# Patient Record
Sex: Female | Born: 1950 | Race: White | Hispanic: No | Marital: Married | Smoking: Former smoker
Health system: Southern US, Community
[De-identification: ages and names within clinical notes are randomized; demographics above are authoritative.]

## PROBLEM LIST (undated history)

## (undated) DIAGNOSIS — I10 Essential (primary) hypertension: Secondary | ICD-10-CM

## (undated) DIAGNOSIS — M349 Systemic sclerosis, unspecified: Secondary | ICD-10-CM

## (undated) DIAGNOSIS — R102 Pelvic and perineal pain: Secondary | ICD-10-CM

## (undated) DIAGNOSIS — K219 Gastro-esophageal reflux disease without esophagitis: Secondary | ICD-10-CM

## (undated) DIAGNOSIS — D649 Anemia, unspecified: Secondary | ICD-10-CM

## (undated) DIAGNOSIS — E669 Obesity, unspecified: Secondary | ICD-10-CM

## (undated) DIAGNOSIS — E039 Hypothyroidism, unspecified: Secondary | ICD-10-CM

## (undated) DIAGNOSIS — G4733 Obstructive sleep apnea (adult) (pediatric): Secondary | ICD-10-CM

## (undated) DIAGNOSIS — F419 Anxiety disorder, unspecified: Secondary | ICD-10-CM

## (undated) DIAGNOSIS — R0600 Dyspnea, unspecified: Secondary | ICD-10-CM

## (undated) DIAGNOSIS — M1990 Unspecified osteoarthritis, unspecified site: Secondary | ICD-10-CM

## (undated) DIAGNOSIS — N816 Rectocele: Secondary | ICD-10-CM

## (undated) DIAGNOSIS — Z9989 Dependence on other enabling machines and devices: Principal | ICD-10-CM

## (undated) DIAGNOSIS — J42 Unspecified chronic bronchitis: Secondary | ICD-10-CM

## (undated) DIAGNOSIS — E079 Disorder of thyroid, unspecified: Secondary | ICD-10-CM

## (undated) DIAGNOSIS — J449 Chronic obstructive pulmonary disease, unspecified: Secondary | ICD-10-CM

## (undated) DIAGNOSIS — R195 Other fecal abnormalities: Secondary | ICD-10-CM

## (undated) HISTORY — DX: Anxiety disorder, unspecified: F41.9

## (undated) HISTORY — DX: Essential (primary) hypertension: I10

## (undated) HISTORY — DX: Obesity, unspecified: E66.9

## (undated) HISTORY — DX: Pelvic and perineal pain: R10.2

## (undated) HISTORY — DX: Systemic sclerosis, unspecified: M34.9

## (undated) HISTORY — DX: Rectocele: N81.6

## (undated) HISTORY — DX: Dependence on other enabling machines and devices: Z99.89

## (undated) HISTORY — DX: Obstructive sleep apnea (adult) (pediatric): G47.33

## (undated) HISTORY — PX: LUNG SURGERY: SHX703

## (undated) HISTORY — DX: Other fecal abnormalities: R19.5

---

## 2001-08-23 ENCOUNTER — Other Ambulatory Visit: Admission: RE | Admit: 2001-08-23 | Discharge: 2001-08-23 | Payer: Self-pay | Admitting: Obstetrics and Gynecology

## 2001-08-24 ENCOUNTER — Encounter: Payer: Self-pay | Admitting: Obstetrics and Gynecology

## 2001-08-24 ENCOUNTER — Ambulatory Visit (HOSPITAL_COMMUNITY): Admission: RE | Admit: 2001-08-24 | Discharge: 2001-08-24 | Payer: Self-pay | Admitting: Obstetrics and Gynecology

## 2003-02-21 ENCOUNTER — Encounter: Payer: Self-pay | Admitting: Obstetrics and Gynecology

## 2003-02-21 ENCOUNTER — Ambulatory Visit (HOSPITAL_COMMUNITY): Admission: RE | Admit: 2003-02-21 | Discharge: 2003-02-21 | Payer: Self-pay | Admitting: Obstetrics and Gynecology

## 2003-02-25 ENCOUNTER — Encounter: Payer: Self-pay | Admitting: Obstetrics and Gynecology

## 2003-02-25 ENCOUNTER — Ambulatory Visit (HOSPITAL_COMMUNITY): Admission: RE | Admit: 2003-02-25 | Discharge: 2003-02-25 | Payer: Self-pay | Admitting: Obstetrics and Gynecology

## 2003-03-27 ENCOUNTER — Ambulatory Visit (HOSPITAL_COMMUNITY): Admission: RE | Admit: 2003-03-27 | Discharge: 2003-03-27 | Payer: Self-pay | Admitting: Pulmonary Disease

## 2003-12-09 ENCOUNTER — Ambulatory Visit (HOSPITAL_COMMUNITY): Admission: RE | Admit: 2003-12-09 | Discharge: 2003-12-09 | Payer: Self-pay | Admitting: Internal Medicine

## 2004-03-16 ENCOUNTER — Ambulatory Visit (HOSPITAL_COMMUNITY): Admission: RE | Admit: 2004-03-16 | Discharge: 2004-03-16 | Payer: Self-pay | Admitting: Family Medicine

## 2005-09-19 ENCOUNTER — Ambulatory Visit (HOSPITAL_COMMUNITY): Admission: RE | Admit: 2005-09-19 | Discharge: 2005-09-19 | Payer: Self-pay | Admitting: Family Medicine

## 2006-04-14 ENCOUNTER — Ambulatory Visit (HOSPITAL_COMMUNITY): Admission: RE | Admit: 2006-04-14 | Discharge: 2006-04-14 | Payer: Self-pay | Admitting: Obstetrics and Gynecology

## 2008-01-14 ENCOUNTER — Emergency Department (HOSPITAL_COMMUNITY): Admission: EM | Admit: 2008-01-14 | Discharge: 2008-01-14 | Payer: Self-pay | Admitting: Emergency Medicine

## 2008-01-16 ENCOUNTER — Inpatient Hospital Stay (HOSPITAL_COMMUNITY): Admission: AD | Admit: 2008-01-16 | Discharge: 2008-01-23 | Payer: Self-pay | Admitting: Cardiothoracic Surgery

## 2008-01-16 ENCOUNTER — Ambulatory Visit: Payer: Self-pay | Admitting: Thoracic Surgery (Cardiothoracic Vascular Surgery)

## 2008-01-16 ENCOUNTER — Encounter: Payer: Self-pay | Admitting: Emergency Medicine

## 2008-01-17 ENCOUNTER — Encounter: Payer: Self-pay | Admitting: Thoracic Surgery (Cardiothoracic Vascular Surgery)

## 2008-02-04 ENCOUNTER — Ambulatory Visit: Payer: Self-pay | Admitting: Thoracic Surgery (Cardiothoracic Vascular Surgery)

## 2008-02-14 ENCOUNTER — Ambulatory Visit: Payer: Self-pay | Admitting: Thoracic Surgery (Cardiothoracic Vascular Surgery)

## 2008-02-14 ENCOUNTER — Encounter
Admission: RE | Admit: 2008-02-14 | Discharge: 2008-02-14 | Payer: Self-pay | Admitting: Thoracic Surgery (Cardiothoracic Vascular Surgery)

## 2008-02-29 ENCOUNTER — Ambulatory Visit: Payer: Self-pay | Admitting: Thoracic Surgery (Cardiothoracic Vascular Surgery)

## 2008-02-29 ENCOUNTER — Encounter
Admission: RE | Admit: 2008-02-29 | Discharge: 2008-02-29 | Payer: Self-pay | Admitting: Thoracic Surgery (Cardiothoracic Vascular Surgery)

## 2008-03-13 ENCOUNTER — Ambulatory Visit (HOSPITAL_COMMUNITY): Admission: RE | Admit: 2008-03-13 | Discharge: 2008-03-13 | Payer: Self-pay | Admitting: Family Medicine

## 2008-10-14 ENCOUNTER — Other Ambulatory Visit: Admission: RE | Admit: 2008-10-14 | Discharge: 2008-10-14 | Payer: Self-pay | Admitting: Obstetrics & Gynecology

## 2008-11-13 IMAGING — CR DG CHEST 2V
2 series · 2 of 2 positions shown · non-contrast
Comparison: [REDACTED] chest x-ray 01/23/2008 and [HOSPITAL] CT
chest 01/16/2008

CLINICAL DATA: Follow up pneumonia, shortness of breath, cough,
post lung surgery.

CHEST - 2 VIEW

[w chest pa]
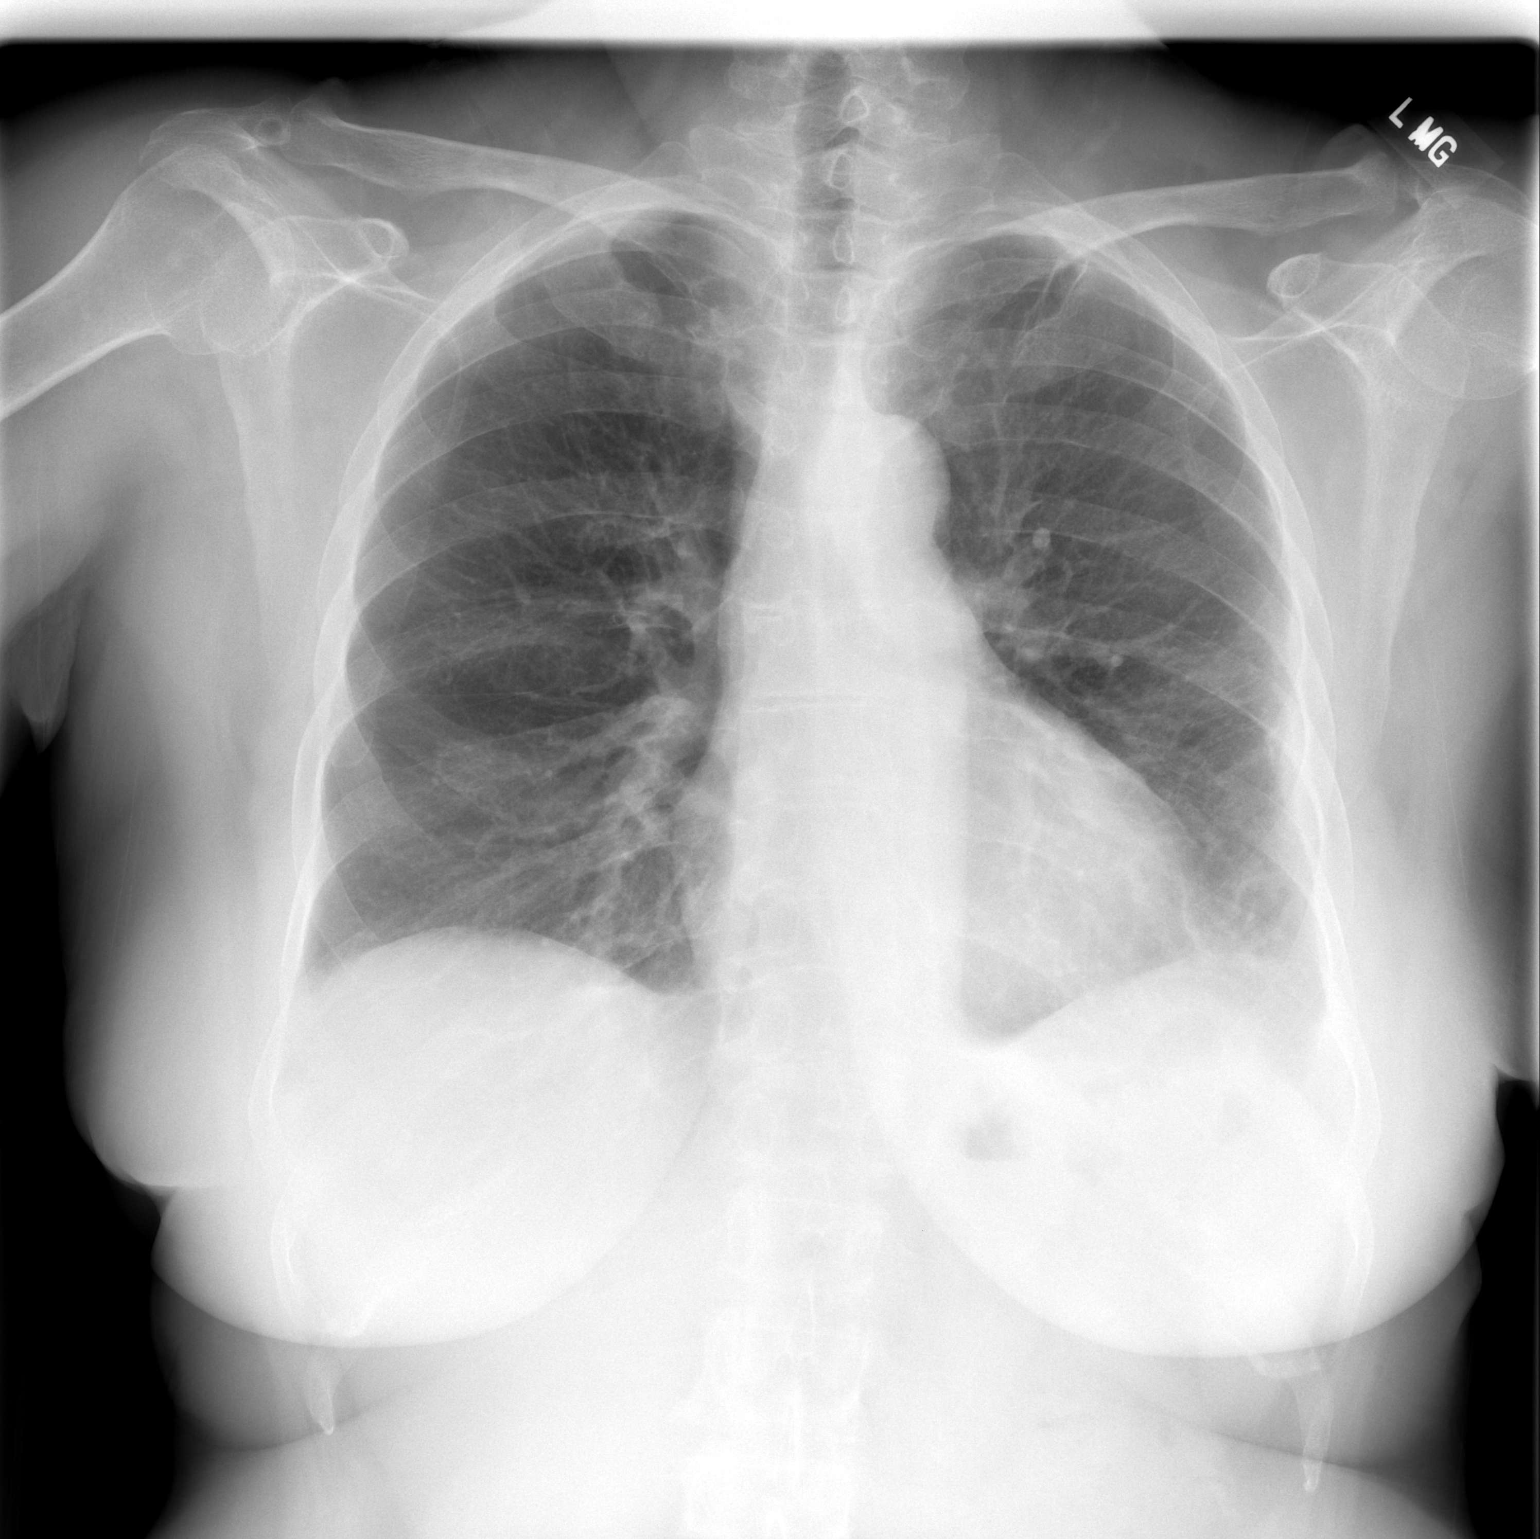

[w chest lat]
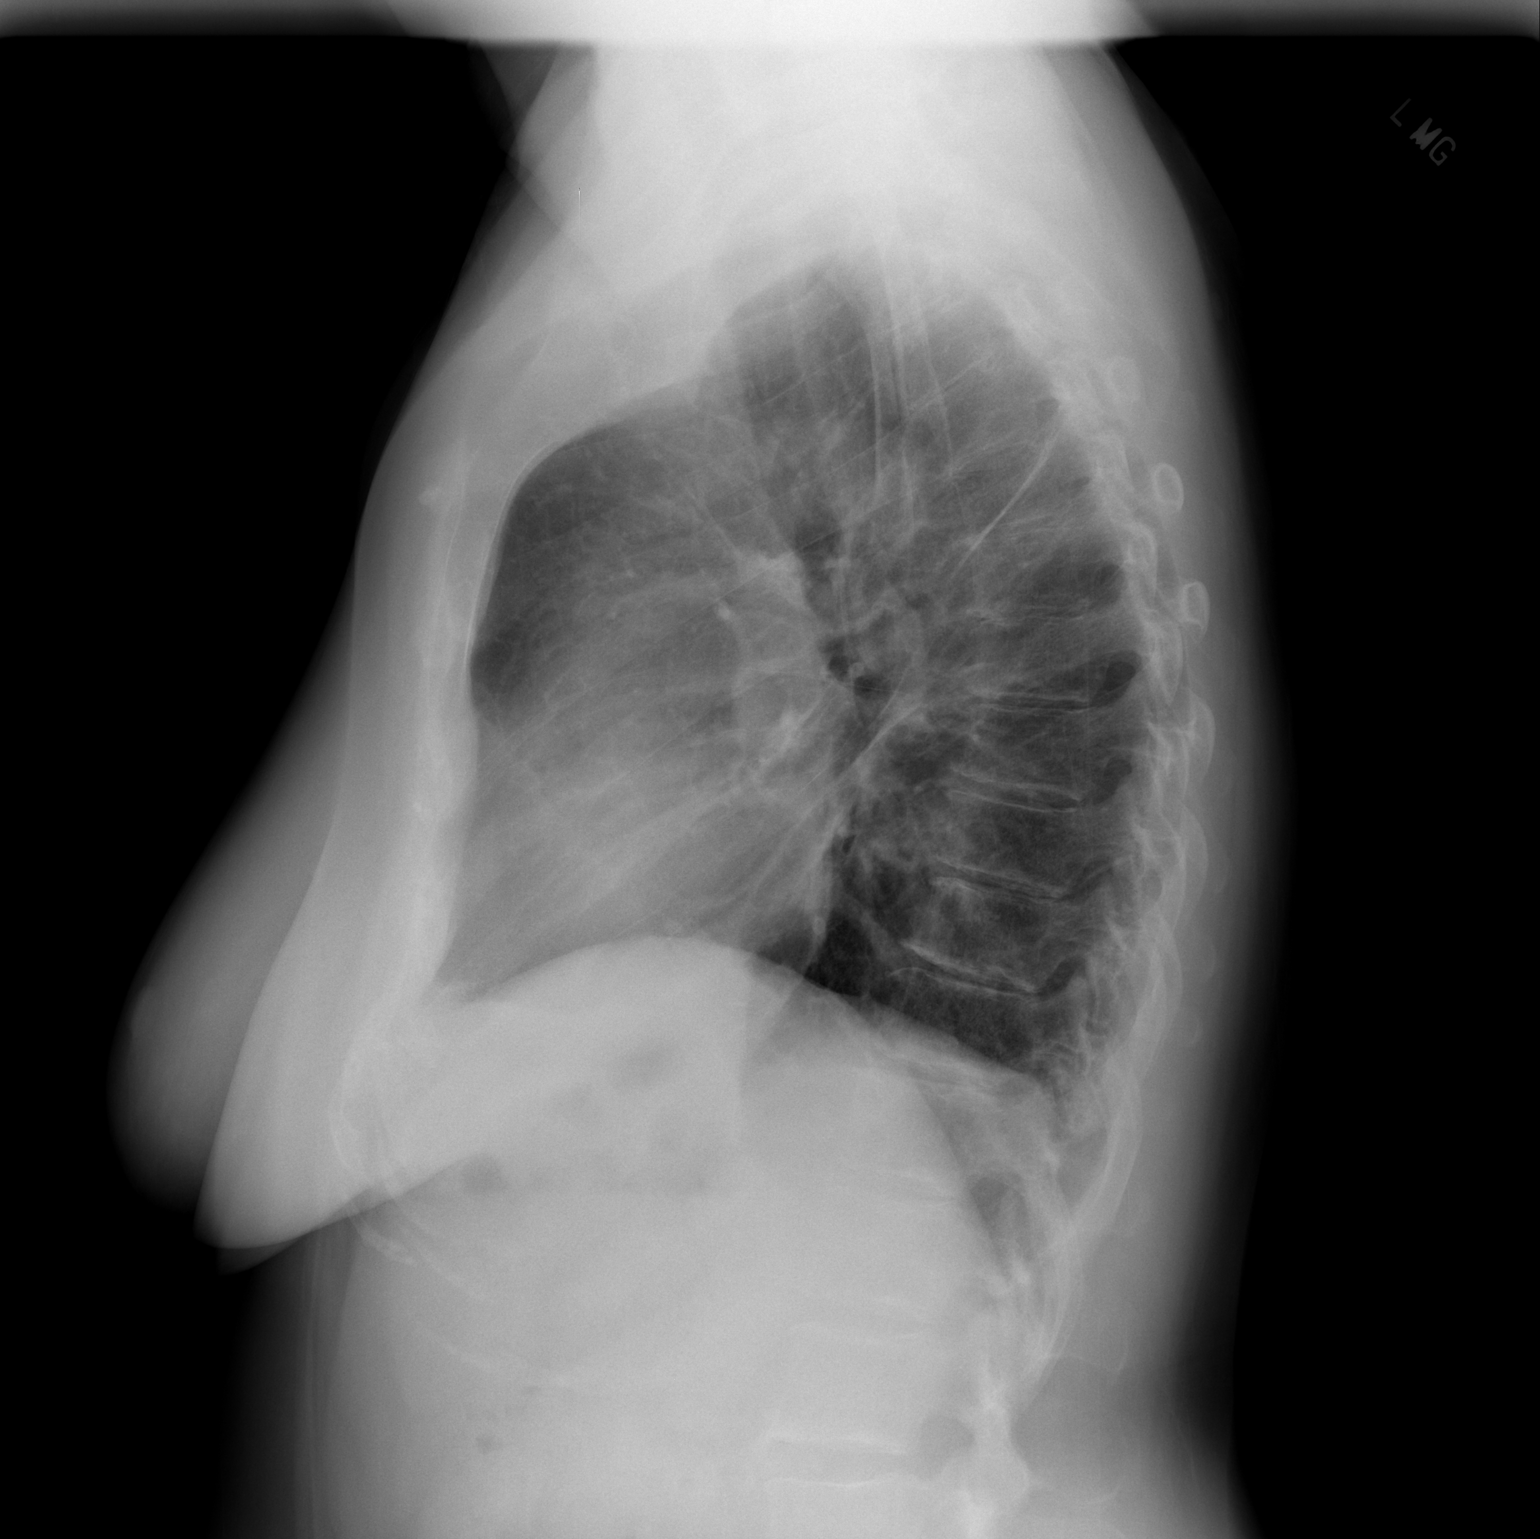

[2 of 2 positions shown; findings below may reference images not displayed]

FINDINGS: Since prior study marked interval clearing left lower
lobe is seen with slight residual linear atelectasis or scarring.
Also, marked interval clearing previous small left pleural effusion
is noted with mild residual pleural thickening or minimal loculated
fluid at the lateral left lower lobe.  Lesser linear scarring or
atelectasis is seen at the left lung apex.  Lungs are otherwise
clear.  Currently heart size is upper limits of normal.  Minimal
levo rotary scoliosis thoracolumbar spine junction is noted.  No
other new significant abnormality is seen.  Specifically no
pneumothorax noted.
IMPRESSION: 1.  Marked interval clearing left lung base with mild residual left
pleural reaction and minimal linear atelectasis and/or scarring at
the left lung base and the left lung ccccapex.
2.  Otherwise no active disease.

## 2010-01-27 ENCOUNTER — Ambulatory Visit (HOSPITAL_COMMUNITY): Admission: RE | Admit: 2010-01-27 | Discharge: 2010-01-27 | Payer: Self-pay | Admitting: Rheumatology

## 2010-04-05 ENCOUNTER — Other Ambulatory Visit: Admission: RE | Admit: 2010-04-05 | Discharge: 2010-04-05 | Payer: Self-pay | Admitting: Obstetrics and Gynecology

## 2010-07-20 ENCOUNTER — Ambulatory Visit (HOSPITAL_COMMUNITY): Admission: RE | Admit: 2010-07-20 | Discharge: 2010-07-20 | Payer: Self-pay | Admitting: Family Medicine

## 2010-09-26 ENCOUNTER — Encounter: Payer: Self-pay | Admitting: Obstetrics and Gynecology

## 2010-11-17 ENCOUNTER — Other Ambulatory Visit (HOSPITAL_COMMUNITY): Payer: Self-pay | Admitting: Family Medicine

## 2010-11-17 DIAGNOSIS — M545 Low back pain, unspecified: Secondary | ICD-10-CM

## 2010-11-17 DIAGNOSIS — M25551 Pain in right hip: Secondary | ICD-10-CM

## 2010-11-19 ENCOUNTER — Ambulatory Visit (HOSPITAL_COMMUNITY)
Admission: RE | Admit: 2010-11-19 | Discharge: 2010-11-19 | Disposition: A | Discharge status: Home / Self Care | Payer: BC Managed Care – PPO | Source: Ambulatory Visit | Attending: Family Medicine | Admitting: Family Medicine

## 2010-11-19 DIAGNOSIS — M545 Low back pain, unspecified: Secondary | ICD-10-CM | POA: Insufficient documentation

## 2010-11-19 DIAGNOSIS — M25559 Pain in unspecified hip: Secondary | ICD-10-CM | POA: Insufficient documentation

## 2010-11-19 DIAGNOSIS — M25551 Pain in right hip: Secondary | ICD-10-CM

## 2010-11-19 DIAGNOSIS — M5126 Other intervertebral disc displacement, lumbar region: Secondary | ICD-10-CM | POA: Insufficient documentation

## 2010-12-07 ENCOUNTER — Ambulatory Visit (HOSPITAL_COMMUNITY)
Admission: RE | Admit: 2010-12-07 | Discharge: 2010-12-07 | Disposition: A | Discharge status: Home / Self Care | Payer: BC Managed Care – PPO | Source: Ambulatory Visit | Attending: Sports Medicine | Admitting: Sports Medicine

## 2010-12-07 ENCOUNTER — Ambulatory Visit (HOSPITAL_COMMUNITY): Payer: Self-pay | Admitting: Physical Therapy

## 2010-12-07 DIAGNOSIS — M6281 Muscle weakness (generalized): Secondary | ICD-10-CM | POA: Insufficient documentation

## 2010-12-07 DIAGNOSIS — R262 Difficulty in walking, not elsewhere classified: Secondary | ICD-10-CM | POA: Insufficient documentation

## 2010-12-07 DIAGNOSIS — M25559 Pain in unspecified hip: Secondary | ICD-10-CM | POA: Insufficient documentation

## 2010-12-07 DIAGNOSIS — M76899 Other specified enthesopathies of unspecified lower limb, excluding foot: Secondary | ICD-10-CM | POA: Insufficient documentation

## 2010-12-07 DIAGNOSIS — M25659 Stiffness of unspecified hip, not elsewhere classified: Secondary | ICD-10-CM | POA: Insufficient documentation

## 2010-12-07 DIAGNOSIS — Z5189 Encounter for other specified aftercare: Secondary | ICD-10-CM | POA: Insufficient documentation

## 2010-12-13 ENCOUNTER — Ambulatory Visit (HOSPITAL_COMMUNITY)
Admission: RE | Admit: 2010-12-13 | Discharge: 2010-12-13 | Disposition: A | Discharge status: Home / Self Care | Payer: BC Managed Care – PPO | Source: Ambulatory Visit | Attending: *Deleted | Admitting: *Deleted

## 2010-12-15 ENCOUNTER — Ambulatory Visit (HOSPITAL_COMMUNITY): Payer: BC Managed Care – PPO | Admitting: *Deleted

## 2011-01-18 NOTE — Op Note (Signed)
[name], Berry               ACCOUNT NO.:  [id number]   MEDICAL RECORD NO.:  [id number]          PATIENT TYPE:  INP   LOCATION:  2314                         FACILITY:  [facility]   PHYSICIAN:  Salvatore Decent. Dorris Fetch, M.D.DATE OF BIRTH:  06/03/1951   DATE OF PROCEDURE:  01/17/2008  DATE OF DISCHARGE:                               OPERATIVE REPORT   PREOPERATIVE DIAGNOSIS:  Left empyema.   POSTOPERATIVE DIAGNOSES:  1. Left empyema  2. Right tension pneumothorax intraoperative.   PROCEDURE:  1. Bronchoscopy.  2. Emergent placement of right tube thoracostomy.  3. Left video-assisted thoracoscopic surgery drainage of empyema and      visceral and parietal pleural decortication.   SURGEON:  Charlett Lango, MD   ASSISTANT:  Stephanie Acre. Dominick, PA   ANESTHESIA:  General.   FINDINGS:  Bronchoscopy friable airways, no endobronchial lesions seen.  Washings taken for culture and cytology bilaterally.  No biopsies  performed.  After unsuccessful attempt at double-lumen endotracheal tube  placement by anesthesia, right tension pneumothorax based on clinical  findings resolved with chest tube placement.  Left VATS revealed a  complex exudative empyema with multiple areas of loculated murky fluid,  extensive exudate on visceral and parietal pleura, moderate pleural  peel.   CLINICAL NOTE:  Regina Berry is a 60 year old woman with a history of  tobacco abuse.  She presented with left-sided pleuritic chest pain and  was found to have a loculated pleural effusion by chest x-ray.  A CT was  performed which showed a loculated empyema.  There also was noted a  right lung nodule.  The patient was advised to undergo left VATS and  drainage of the empyema and decortication.  She was seen initially in  consultation by Dr. Cornelius Moras.  I saw the patient the morning of surgery and  reviewed the indications, risks, benefits and alternatives.  She  understood, accepted risks and agreed to proceed with  surgery.   OPERATIVE NOTE:  [name] was brought to the preop holding area on Jan 17, 2008.  There Anesthesia placed a triple-lumen central venous  catheter and an arterial blood pressure monitoring catheter.  PAS hose  were placed for DVT prophylaxis and intravenous antibiotics were  administered.  The patient was taken to the operating room, anesthetized  and intubated with a single-lumen endotracheal tube.  Flexible  fiberoptic bronchoscopy was performed.  It showed some edema in the  airways and some friability particularly on the left side.  There was  normal endobronchial anatomy and no endobronchial lesions were seen.  Anesthesia then attempted to place a dual lumen endotracheal tube for  use during the video-assisted portion of the procedure.  However,  attempts at placement were unsuccessful.  The patient then was  reintubated with a single-lumen tube.  Shortly, thereafter was noted  that the patient had markedly increased peak airway pressures that came  very difficult for Anesthesia to ventilate the patient even with hand  bagging with continued marked increase.  The patient had a drop in her  saturations.  Bronchoscopy was performed to confirm endotracheal  placement.  The tube was in fact in good position.  Diminished breath  sounds were noted bilaterally, right greater than left.  In fact, breath  sounds were completely absent on the right side.  An attempt was made to  decompress the chest with a 14-gauge intravenous catheter but was  unsuccessful.  The chest was rapidly prepped and an incision was made  and the chest was entered bluntly using a hemostat in the anterolateral  chest.  There was an immediate rush of air.  A 28-French chest tube was  placed with immediate improvement of the patient's oxygen saturations  and airway pressures.  The patient then was able to be ventilated easily  throughout the remainder of the procedure.  A chest tube was placed to  suction.   The patient then was placed in a right lateral decubitus  position, and the left chest was prepped and draped in the usual sterile  fashion.   An incision was made in the midaxillary line and the seventh intercostal  space was carried through the skin and subcutaneous tissue.  The chest  was entered bluntly using a hemostat.  A finger was inserted to take  down any localized adhesions.  There was fluid present.  This was  evacuated and sent for both cultures, as well as cytology.  A small  utility thoracotomy was performed anteriorly approximately the fifth  intercostal space.  The skin and subcutaneous tissue were incised.  Hemostasis was achieved with cautery.  A small portion of the latissimus  was divided.  The serratus was separated and the intercostal muscles  were divided.  The thoracoscope was placed through the initial incision.  There were additional pockets of fluid and these were evacuated.  This  entire portion of the procedure had to be done with intermittent  ventilation.  There was extensive exudate on both the visceral and  parietal pleural surfaces and there was a visceral pleural peel.  Care  was taken to initially ensure that all pockets of fluid had been  evacuated.  The visceral pleural exudate and peel then were removed.  The peel was particularly thick in the posterolateral aspect of the left  lower lobe, as well as in the fissure particularly inferiorly towards  the lingula.  There was some partial denuding of the lung in areas where  the peel was particularly thickened particularly in the basilar portion  of the left lower lobe overlying the diaphragm.  There was extensive  necrotic and exudative debris in the area of the diaphragm.  This was  debrided as well.  The exudative material was also debrided off the  parietal pleura.  The chest was copiously irrigated with warm saline.  The debris was sent for culture, as well as pathology.  A 36-French  right-angle  tube was placed posteriorly and superiorly, a second 36-  French right-angle tube was placed inferiorly along the diaphragm.  Both  the chest tubes were placed to suction.  The remaining port access  incision was closed with a 0 Vicryl fascial suture and 3-0 Vicryl  subcuticular suture.  The anterior mini utility thoracotomy was closed  with a running 0 Vicryl fascial suture, followed by a 2-0 Vicryl  subcutaneous suture, and a 3-0 Vicryl subcuticular suture.  Because of  difficulties with intubation, as well as need to keep the lung inflated,  the patient was taken from the operating room to the surgical intensive  care unit intubated and in stable condition.  Salvatore Decent Dorris Fetch, M.D.  Electronically Signed     SCH/MEDQ  D:  01/17/2008  T:  01/18/2008  Job:  [id number]

## 2011-01-18 NOTE — Discharge Summary (Signed)
Regina Berry, Regina Berry               ACCOUNT NO.:  [id number]   MEDICAL RECORD NO.:  [id number]          PATIENT TYPE:  INP   LOCATION:  2037                         FACILITY:  [facility]   PHYSICIAN:  Salvatore Decent. Dorris Fetch, M.D.DATE OF BIRTH:  May 12, 1951   DATE OF ADMISSION:  01/16/2008  DATE OF DISCHARGE:  01/23/2008                               DISCHARGE SUMMARY   ADMITTING DIAGNOSES:  1. Community-acquired pneumonia of the left lung with acute empyema.  2. Right lung nodule.   DISCHARGE DIAGNOSES:  1. Community-acquired pneumonia of the left lung with acute empyema.  2. Right lung nodule.  3. Tension right pneumothorax (intraop).   PROCEDURES:  1. Placement of right chest tube.  2. Bronchoscopy.  3. Left VATS with drainage of empyema and decortication by Dr.      Dorris Fetch on Jan 17, 2008.   HOSPITAL COURSE SUMMARY:  This is a 60 year old Caucasian female with a  long history of tobacco abuse who presented to [facility] with a 4-day  history of left-sided pleuritic chest pain, fever, and mild shortness of  breath.  The patient was treated for presumed community-acquired  pneumonia with a Z-Pak.  However, she returned to her primary care  physician [facility]) on Jan 16, 2008, with no  improvement in the aforementioned symptoms.  She returned to [facility] where CAT scan of the chest was done.  It revealed a left lung  consolidation and large loculated left pleural effusion with acute  empyema.  There was also a small opacity in the superior segment of the  right lower lobe of unclear etiology.  The patient was transferred from  [facility] to the [facility] where she underwent the  aforementioned procedure.  It should be noted that pathology revealed no  malignant cells in the left pleural fluid, inflammation, or active  mesothelial cells present, however.  Chest tubes revealed no air leak.  Right chest tube was placed to water seal   on Jan 18, 2008, and then  removed the following day.  Followup chest x-ray revealed no evidence of  pneumothorax.  The patient was placed on Zosyn IV antibiotic  postoperatively.  She was then changed to p.o. Augmentin.  Second chest  tube was removed on Jan 21, 2008, and followup chest x-ray revealed no  pneumothorax.  The patient did require nasal cannula postoperatively.  This was weaned as tolerated.  She was put to room air on Jan 22, 2008;  however, she desaturated to 87%.  She was placed back on 2 liters nasal  cannula with an O2 sat greater than 94%, and she continued to improve.  By postoperative day #6, which was Jan 23, 2008, she had T-max 99.3.  Vital signs were stable.  O2 sat was 98% on 2 liters nasal cannula.  Chest x-ray showed the airspace disease in consolidation of the left  base to be stable, small left pleural effusion and pulmonary nodule in  the right base, again stable. (as seen previously).   PHYSICAL EXAMINATION:  CARDIAC:  Regular rate and rhythm.  PULMONARY:  Diminished breath sounds at the left lower lobe.   Her incisions are clean, dry and continuing to heal.  She is going to  try to be weaned off O2; however, if O2 sat drops less than 90%, home  oxygen will be arranged.  She is going to discharged today; however,  prior to her discharge, smoking cessation consult will be obtained.   DISCHARGE INSTRUCTIONS:  Include the following:  1. She may shower.  2. She is not to lift or drive for 3 weeks.  3. She is to follow up with Dr. Dorris Fetch, on February 14, 2008.  At      that time, a chest x-ray will be obtained prior to this      appointment.   DISCHARGE MEDICATIONS:  Include the following:  1. Augmentin 500 mg 1 p.o. twice daily for 14 days.  2. Tylox 1-2 tablets p.o. q.4-6 h. p.r.n. pain.      Regina Fudge, PA      Viviann Spare C. Dorris Fetch, M.D.  Electronically Signed    DZ/MEDQ  D:  01/23/2008  T:  01/23/2008  Job:  [id number]

## 2011-01-18 NOTE — H&P (Signed)
Regina Berry, Regina Berry               ACCOUNT NO.:  [id number]   MEDICAL RECORD NO.:  [id number]          PATIENT TYPE:   LOCATION:                                 FACILITY:   PHYSICIAN:  Salvatore Decent. Dorris Fetch, M.D.DATE OF BIRTH:  [birth year]   DATE OF ADMISSION:  DATE OF DISCHARGE:                              HISTORY & PHYSICAL   CHIEF COMPLAINT:  Left-sided chest pain with shortness of breath.   HISTORY OF PRESENT ILLNESS:  Regina Berry is a 60 year old female with no  past medical history.  She states that she started having left-sided  chest pain on Sunday evening.  She took some Aleve and this subsided  slightly.  On Monday morning, left-sided chest pain increased and she  was having difficulty breathing.  She went to the emergency room where a  chest x-ray was done and she was diagnosed with a left pneumonia.  The  patient was placed on Zithromax as well as given oxycodone for pain.  The patient states she went home and started taking these medications.  On Wednesday, she noticed no improvement and left-sided chest pain was  stabbing, 10/10.  Increased pain with deep breathing.  She then called  her primary physician and was seen by them.  After seen by primary  physician, the patient was instructed to go to [facility] Emergency  Room.  In the [facility] Emergency Room, repeat chest x-ray was done,  which showed worsening left pneumonia.  The patient then underwent a CT  scan, which showed a highly loculated left pleural effusion.  There is  an airspace nodule in the right lower lobe, which questionably may be  infectious.  Following CT scan, Dr. Tyrone Sage was contacted.  After  evaluation of the CT scan, they decided to transfer the patient over to  [facility] for possible drainage of left empyema.   The patient states that her left-sided chest pain is improved with the  pain medications.  Still has some discomfort with deep breathing.  She  states she did have a fever  of 102 on Sunday, but today it was still 99.  She denies coughing, hemoptysis, or wheezing.   PAST MEDICAL HISTORY:  None.   SURGICAL HISTORY:  1. Cesarean section.  2. Surgery, left hand.   ALLERGIES:  The patient states she is allergic to CODEINE, causes  itching.   SOCIAL HISTORY:  Positive tobacco abuse, mostly about packet a day for  the past 40 years.  Denies alcohol abuse.  She is currently still  working in a tobacco factory.   FAMILY HISTORY:  Noncontributory.   MEDICATIONS:  No medications taken at home on a regular basis.   REVIEW OF SYSTEMS:  See HPI for pertinent positives and negatives.  The  patient denies any recent changes in vision, hearing, or difficulty  swallowing.  Denies headache.  Denies palpitations, lightheadedness,  dizziness, orthopnea, or proximal nocturnal dyspnea.  Denies abdominal  pain, changes in bowel movements, diarrhea, constipation, melena,  hematemesis, or hematochezia.  Denies urgency, frequency, dysuria, or  hematuria.  Denies muscle aches or pains.  Denies claudication symptoms  such as pain in calves with ambulation or decreased temperature in feet.  Denies TIA or CVA symptoms such as amaurosis fugax, slurred speech, or  muscle weakness.   PHYSICAL EXAMINATION:  VITAL SIGNS:  Not available.  GENERAL:  A well-developed, well-nourished white female, in no acute  distress.  HEENT:  Normocephalic and atraumatic.  The pupils are equal, round, and  reactive to light and accommodation.  Extraocular movements are intact.  NECK:  Neck is supple.  RESPIRATORY:  Diminished breath sounds, left base.  CARDIAC:  Regular rate and rhythm with S1 and S2 noted.  No murmurs,  gallops, or rubs noted.  ABDOMEN:  Bowel sounds x4.  Soft and nontender on palpitation.  DENTURE:  Now deferred.  RECTAL:  Deferred.  EXTREMITIES:  The patient's upper and lower extremities are warm to  touch.  She has 2+ bilateral radial, DP, and PT pulses noted.  No edema,   cyanosis, or clubbing noted.   STUDIES:  The patient had CT scan done at [facility] today on Jan 16, 2008, showing a highly loculated left pleural effusion and an airspace  nodule in the right lower lobe.   IMPRESSION AND PLAN:  We will admit the patient to [facility]  under Dr. Dorris Fetch with diagnosis of a left empyema and a right lower  lobe nodule.  We will obtain routine lab work and PA and lateral chest x-  ray as well as start the patient on IV vancomycin.  The patient will be  seen and evaluated by Dr. Cornelius Moras on admission for discussion of surgery by  Dr. Dorris Fetch for Jan 17, 2008.  Plan is to proceed with a left video-  assisted thoracoscopic surgery with drainage the left empyema on Jan 17, 2008, by Dr. Dorris Fetch.  Risks and benefits were discussed with the  patient.  She acknowledged understanding and agreed to proceed.      Regina Belfast, PA      Salvatore Decent. Dorris Fetch, M.D.  Electronically Signed    KMD/MEDQ  D:  01/16/2008  T:  01/17/2008  Job:  [id number]   cc:   Salvatore Decent. Dorris Fetch, M.D.

## 2011-01-18 NOTE — Assessment & Plan Note (Signed)
OFFICE VISIT   Regina, FORDHAM Berry  DOB:  August 13, 1951                                        February 14, 2008  CHART #:  [id number]   REASON FOR VISIT:  Follow up recent decortication.   HPI:  This patient is a 60 year old woman who had left empyema.  She had  a VATS for drainage and decortication on Jan 17, 2008, when she was  intubated, but before we started the left-sided VATS, she had a right  tension pneumothorax and had a chest tube placed for that.  She now  returns for postoperative followup.  She says that she still  occasionally feels some discomfort, particularly when she takes a deep  breath, but is not having to take any pain medication.  She has some  irritation from her chest tube sutures, otherwise feels well.  She did  not have any fevers or chills.   PHYSICAL EXAMINATION:  This patient is a 60 year old woman in no acute  distress.  Blood pressure 145/86, pulse 68, respirations are 22, and oxygen  saturation is 96% on room air.  LUNGS:  Equal breath sounds bilaterally.  Her incisions are healing well.  She does have some local irritation  from the chest tube sutures, which were removed, but the sites are  healed.   Chest x-ray shows good aeration of the left lung, much improved.  There  is still some opacity in the right lower lobe, but this is less  prominent than on  the film on Jan 16, 2008.   IMPRESSION:  This patient is a 60 year old woman, she is status post a  decortication for a left empyema.  She has had an excellent surgical  result and is recovering nicely from that.  There is still a question of  the right lung nodule that was seen on the CT and previous chest x-rays.  This may be infection related and does appear to be less prominent on  the chest x-ray today although, I cannot rule out slight variation in  technique.  I have recommended to her that we repeat a CT  scan to reevaluate that right lung nodule and make sure that  is  resolving.  We will plan to do that in about 2 weeks, that will be 6  weeks from the time of her presentation.   Salvatore Decent Dorris Fetch, M.D.  Electronically Signed   SCH/MEDQ  D:  02/14/2008  T:  02/14/2008  Job:  [id number]   cc:   [facility]

## 2011-01-18 NOTE — Assessment & Plan Note (Signed)
OFFICE VISIT   MANUEL, LAWHEAD L  DOB:  June 04, 1951                                        February 29, 2008  CHART #:  [id number]   HISTORY OF PRESENT ILLNESS:  The patient is a 60 year old woman who had  a left empyema.  She had a left VATS and decortication on Jan 17, 2008.  She was seen in the office on February 14, 2008, and there was a question at  that time of a right lung nodule.  She now returns with a CT for  followup.  She states in the interim, she has  been doing well.  No  fevers, chills, or sweats.  No problems with her breathing.  She still  has some numbness in her left subcostal region.   PHYSICAL EXAMINATION:  GENERAL:  The patient is a well-appearing 56-year-  old white female in no acute distress.  She is well-developed and well-  nourished.  VITAL SIGNS:  Blood pressure 173/84, pulse 64, respirations  were 18, and her oxygen saturation is 97% on room air.  LUNGS:  Clear with equal breath sounds.   LABORATORY DATA:  CT scan is reviewed.  There has been an interval  decrease in the size of the abnormality in the superior segment of her  right lower lobe.  It has gone from 13 mm down to 5 mm consistent with  resolving infection.  She had a 4-mm nodule in her left lung on the  previous study, which is no longer evident.  She also has a small left  adrenal nodule.  This had been previously 11 x 17, it is now 9 x 13, so  it is decreased in size as well.  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  There is  no mass evident.   IMPRESSION:  The patient is doing well at this point in time.  She is  now recovering from a decortication.  Her sense of numbness in her  subcostal region is common after thoracic surgery, usually will resolve  with time but sometimes can remain that way.  Her radiologic result is  excellent with the best I have seen with a decortication.  She had  multiple other findings  of small nodule in her left lung, which is now  resolved.  Question of a nodule in the right lung, which is markedly  decreased in size and consistent with resolving infection as well as a  benign-appearing adrenal lesion, which is decreased in size since her  last study.  I did discuss with her this finding of esophageal  dilatation and question of dysmotility.  Certainly, it could be a  contributing factor to why she developed a pneumonia.  On further  questioning with her in that regard, she has had some questionable  reflux-like symptoms.  I advised her that she should have a GI  evaluation.  She has requested Dr. Jena Gauss in [place], who her husband  has seen previously.  We will see if we can help her to arrange that  appointment to see if there is any further evaluation, and is to be done  in that regard.   Salvatore Decent Dorris Fetch, M.D.  Electronically Signed   SCH/MEDQ  D:  02/29/2008  T:  03/01/2008  Job:  [id number]   cc:   [facility]

## 2011-01-21 NOTE — Op Note (Signed)
NAME:  Regina Berry, Regina Berry                         ACCOUNT NO.:  [id number]   MEDICAL RECORD NO.:  [id number]                   PATIENT TYPE:  AMB   LOCATION:  DAY                                  FACILITY:  [facility]   PHYSICIAN:  Lionel December, M.D.                 DATE OF BIRTH:  1951/01/17   DATE OF PROCEDURE:  12/09/2003  DATE OF DISCHARGE:                                 OPERATIVE REPORT   PROCEDURE:  Total colonoscopy.   ENDOSCOPIST:  Lionel December, M.D.   INDICATIONS:  Regina Berry is a 60 year old Caucasian female with intermittent  hematochezia who is undergoing diagnostic colonoscopy.  At times she is  constipated, at other times she has postprandial loose bowel movements.  She  is suspected ot have IBS.  The procedure and risks were reviewed with the  patient and informed consent was obtained.  Family history is negative for  colon carcinoma in first-degree relatives, but positive for CRC in maternal  aunt.   PREOPERATIVE MEDICATIONS:  Demerol 50 mg IV and Versed 8 mg IV in divided  dose.   FINDINGS:  Procedure performed in endoscopy suite.  The patient's vital  signs and O2 saturation were monitored during the procedure and remained  stable.  The patient was placed in the left lateral recumbent position and  rectal examination was performed.  No abnormality noted on external or  digital exam.   Olympus videoscope was placed in the rectum and advanced under vision into  the sigmoid colon and beyond.  Preparation was excellent.  Scope was passed  to the cecum which was identified by appendiceal orifice and the ileocecal  valve.  Pictures were taken for the record.  As the scope was withdrawn the  colonic mucosa was carefully examined.  There were no polyps, tumor masses,  or other abnormalities.  The rectal mucosa similarly was normal.   The scope was retroflexed to examine the anorectal junction and hemorrhoids  were noted below the dentate line along with a prominent anal  papilla.  Pictures taken for the record.  The endoscope was straightened and  withdrawn.  The patient tolerated the procedure well.   FINAL DIAGNOSIS:  Small external hemorrhoids with a prominent anal papilla,  otherwise normal colonoscopy.   RECOMMENDATIONS:  1. High fiber diet.  2. Citrucel or equivalent 1 tablespoonful daily.  3. She will call if bleeding episodes are frequent.      ___________________________________________                                            Lionel December, M.D.   NR/MEDQ  D:  12/09/2003  T:  12/09/2003  Job:  [id number]   cc:   Tilda Burrow, M.D.  [address] [place]  [place] [address]  Fax: [phone]   Kirk Ruths, M.D.  [address]  [place]  [place] [address]  Fax: [phone]

## 2011-06-01 LAB — BASIC METABOLIC PANEL
BUN: 7
CO2: 28
CO2: 29
Calcium: 8.5
Calcium: 8.7
Calcium: 8.9
Creatinine, Ser: 0.58
GFR calc Af Amer: >60
GFR calc Af Amer: >60
GFR calc non Af Amer: >60
GFR calc non Af Amer: >60
GFR calc non Af Amer: >60
Glucose, Bld: 130 — ABNORMAL HIGH
Glucose, Bld: 174 — ABNORMAL HIGH
Potassium: 3.5
Sodium: 140
Sodium: 141

## 2011-06-01 LAB — POCT I-STAT 3, ART BLOOD GAS (G3+)
Acid-Base Excess: 2
Acid-Base Excess: 2
Bicarbonate: 26.8 — ABNORMAL HIGH
Bicarbonate: 26.8 — ABNORMAL HIGH
O2 Saturation: 95
O2 Saturation: 98
Operator id: 137421
Operator id: 300061
Patient temperature: 97.9
Patient temperature: 98.4
TCO2: 28
TCO2: 28
pCO2 arterial: 40.3
pCO2 arterial: 41.2
pH, Arterial: 7.42 — ABNORMAL HIGH
pH, Arterial: 7.43 — ABNORMAL HIGH
pO2, Arterial: 106 — ABNORMAL HIGH
pO2, Arterial: 72 — ABNORMAL LOW

## 2011-06-01 LAB — CBC
HCT: 32.7 — ABNORMAL LOW
Hemoglobin: 10.9 — ABNORMAL LOW
Hemoglobin: 11.4 — ABNORMAL LOW
MCHC: 33.8
MCHC: 34.8
Platelets: 208
RBC: 3.58 — ABNORMAL LOW
RDW: 13.4
RDW: 13.8
WBC: 11.7 — ABNORMAL HIGH

## 2011-06-27 ENCOUNTER — Other Ambulatory Visit: Payer: Self-pay | Admitting: Obstetrics and Gynecology

## 2011-06-27 DIAGNOSIS — Z139 Encounter for screening, unspecified: Secondary | ICD-10-CM

## 2011-07-25 ENCOUNTER — Ambulatory Visit (HOSPITAL_COMMUNITY)
Admission: RE | Admit: 2011-07-25 | Discharge: 2011-07-25 | Disposition: A | Discharge status: Home / Self Care | Payer: BC Managed Care – PPO | Source: Ambulatory Visit | Attending: Obstetrics and Gynecology | Admitting: Obstetrics and Gynecology

## 2011-07-25 DIAGNOSIS — Z139 Encounter for screening, unspecified: Secondary | ICD-10-CM

## 2011-07-25 DIAGNOSIS — Z1231 Encounter for screening mammogram for malignant neoplasm of breast: Secondary | ICD-10-CM | POA: Insufficient documentation

## 2011-08-10 ENCOUNTER — Other Ambulatory Visit (HOSPITAL_COMMUNITY)
Admission: RE | Admit: 2011-08-10 | Discharge: 2011-08-10 | Disposition: A | Discharge status: Home / Self Care | Payer: BC Managed Care – PPO | Source: Ambulatory Visit | Attending: Obstetrics and Gynecology | Admitting: Obstetrics and Gynecology

## 2011-08-10 ENCOUNTER — Other Ambulatory Visit: Payer: Self-pay | Admitting: Adult Health

## 2011-08-10 DIAGNOSIS — Z01419 Encounter for gynecological examination (general) (routine) without abnormal findings: Secondary | ICD-10-CM | POA: Insufficient documentation

## 2012-04-01 ENCOUNTER — Emergency Department (HOSPITAL_COMMUNITY)
Admission: EM | Admit: 2012-04-01 | Discharge: 2012-04-01 | Disposition: A | Discharge status: Home / Self Care | Payer: BC Managed Care – PPO | Attending: Emergency Medicine | Admitting: Emergency Medicine

## 2012-04-01 ENCOUNTER — Encounter (HOSPITAL_COMMUNITY): Payer: Self-pay

## 2012-04-01 DIAGNOSIS — R609 Edema, unspecified: Secondary | ICD-10-CM

## 2012-04-01 DIAGNOSIS — R600 Localized edema: Secondary | ICD-10-CM

## 2012-04-01 DIAGNOSIS — E079 Disorder of thyroid, unspecified: Secondary | ICD-10-CM | POA: Insufficient documentation

## 2012-04-01 HISTORY — DX: Disorder of thyroid, unspecified: E07.9

## 2012-04-01 LAB — BASIC METABOLIC PANEL
BUN: 17 mg/dL (ref 6–23)
CO2: 29 mEq/L (ref 19–32)
Chloride: 102 mEq/L (ref 96–112)
GFR calc Af Amer: 70 mL/min — ABNORMAL LOW (ref >90)
Glucose, Bld: 90 mg/dL (ref 70–99)
Potassium: 3.8 mEq/L (ref 3.5–5.1)

## 2012-04-01 MED ORDER — ENOXAPARIN SODIUM 100 MG/ML ~~LOC~~ SOLN
100.0000 mg | Freq: Once | SUBCUTANEOUS | Status: AC
  Administered 2012-04-01: 100 mg via SUBCUTANEOUS
  Filled 2012-04-01: qty 1

## 2012-04-01 NOTE — ED Notes (Signed)
Left foot swollen, started about 4 days ago per pt.

## 2012-04-01 NOTE — ED Notes (Signed)
Pt alert & oriented x4, stable gait. Patient given discharge instructions, paperwork. Patient instructed to stop at the registration desk to finish any additional paperwork. Patient verbalized understanding. Pt left department w/ no further questions.  

## 2012-04-01 NOTE — ED Notes (Signed)
Swelling noted to the left foot for the past 4 days, denies any injury. Some edema noted, pulses present, good cap refill.

## 2012-04-02 ENCOUNTER — Other Ambulatory Visit (HOSPITAL_COMMUNITY): Payer: BC Managed Care – PPO

## 2012-04-02 ENCOUNTER — Ambulatory Visit (HOSPITAL_COMMUNITY)
Admit: 2012-04-02 | Discharge: 2012-04-02 | Disposition: A | Discharge status: Home / Self Care | Payer: BC Managed Care – PPO | Source: Ambulatory Visit | Attending: Emergency Medicine | Admitting: Emergency Medicine

## 2012-04-02 DIAGNOSIS — R609 Edema, unspecified: Secondary | ICD-10-CM

## 2012-04-02 DIAGNOSIS — M7989 Other specified soft tissue disorders: Secondary | ICD-10-CM | POA: Insufficient documentation

## 2012-04-02 NOTE — ED Provider Notes (Signed)
Medical screening examination/treatment/procedure(s) were performed by non-physician practitioner and as supervising physician I was immediately available for consultation/collaboration.  Geoffery Lyons, MD 04/02/12 1037

## 2012-04-02 NOTE — ED Provider Notes (Signed)
History     CSN: [id number]  Arrival date & time 04/01/12  2030   First MD Initiated Contact with Patient 04/01/12 2040      Chief Complaint  Patient presents with  . Foot Swelling    (Consider location/radiation/quality/duration/timing/severity/associated sxs/prior treatment) HPI Comments: Regina Berry presents with a 4 day history of increased swelling in her left foot and lower leg.  She denies pain, but states the extremity feels very tight and uncomfortable.  She does have a history of intermittent peripheral edema for which she takes lasix,  Reporting usually takes 1-2 tablets per week on average.  This medicine has not been improving the edema in her left leg. She denies any increased activity or or prolonged standing and also denies any significant long periods of inactivity,  But did ride to [place], [place] and back last week.  She has had no fevers,  Chills,  Shortness of breath,  Chest pain or rash.  She also denies trauma to the extremity.  Her last dose of lasix was yesterday.  She has kept her foot elevated without relief of swelling.  The history is provided by the patient and a relative.    Past Medical History  Diagnosis Date  . Thyroid disease     Past Surgical History  Procedure Date  . Cesarean section   . Lung surgery     History reviewed. No pertinent family history.  History  Substance Use Topics  . Smoking status: Never Smoker   . Smokeless tobacco: Not on file  . Alcohol Use: Yes     occasionally    OB History    Grav Para Term Preterm Abortions TAB SAB Ect Mult Living                  Review of Systems  Constitutional: Negative for fever.  HENT: Negative.   Eyes: Negative.   Respiratory: Negative for chest tightness and shortness of breath.   Cardiovascular: Positive for leg swelling. Negative for chest pain.  Gastrointestinal: Negative for nausea and abdominal pain.  Genitourinary: Negative.  Negative for decreased urine volume.    Musculoskeletal: Negative for joint swelling and arthralgias.  Skin: Negative.  Negative for color change, rash and wound.  Neurological: Negative for dizziness, weakness, light-headedness, numbness and headaches.  Hematological: Negative.   Psychiatric/Behavioral: Negative.     Allergies  Codeine  Home Medications   Current Outpatient Rx  Name Route Sig Dispense Refill  . ALPRAZOLAM 0.5 MG PO TABS Oral Take 0.5 mg by mouth daily as needed. anxiety    . FUROSEMIDE 20 MG PO TABS Oral Take 20 mg by mouth daily as needed. fluid    . HYDROCODONE-ACETAMINOPHEN 5-500 MG PO TABS Oral Take 1 tablet by mouth 2 (two) times daily as needed. pain    . LEVOTHYROXINE SODIUM 75 MCG PO TABS Oral Take 75 mcg by mouth daily.    Marland Kitchen ZOLPIDEM TARTRATE 10 MG PO TABS Oral Take 5 mg by mouth at bedtime as needed. sleep      BP 137/69  Pulse 67  Temp 98 F (36.7 C) (Oral)  Resp 16  Ht 5' 4.5" (1.638 m)  Wt 220 lb (99.791 kg)  BMI 37.18 kg/m2  SpO2 99%  Physical Exam  Nursing note and vitals reviewed. Constitutional: She appears well-developed and well-nourished.  HENT:  Head: Normocephalic and atraumatic.  Neck: No JVD present.  Cardiovascular: Normal rate, regular rhythm, normal heart sounds and intact distal pulses.  Pulses:      Dorsalis pedis pulses are 2+ on the right side, and 2+ on the left side.  Pulmonary/Chest: Effort normal and breath sounds normal. No respiratory distress. She has no wheezes. She has no rales.  Abdominal: She exhibits no distension. There is no tenderness.  Musculoskeletal: Normal range of motion. She exhibits edema and tenderness.       Trace edema right ankle.  1+ edema left dorsal foot and ankle to mid tibia.  No erythema or red streaking,  No palpable cords or nodules.  Positive Homans.    Neurological: She is alert.  Skin: Skin is warm and dry.  Psychiatric: She has a normal mood and affect.    ED Course  Procedures (including critical care time)  Labs  Reviewed  BASIC METABOLIC PANEL - Abnormal; Notable for the following:    GFR calc non Af Amer 61 (*)     GFR calc Af Amer 70 (*)     All other components within normal limits  D-DIMER, QUANTITATIVE - Abnormal; Notable for the following:    D-Dimer, Quant 0.62 (*)     All other components within normal limits   No results found.   1. Lower extremity edema       MDM  Pt with unilaterally increased peripheral edema and abnormal d dimer.  Pts sx may simply be due to benign increase in peripheral edema, but unable to rule out dvt with elevated d dimer.  Pt tx with lovenox injection.  Scheduled to return in am for doppler US.  Pt agreeable with plan.        Burgess Amor, [place] 04/02/12 [phone]

## 2012-04-02 NOTE — ED Provider Notes (Signed)
Patient returned today for venous imaging of left LE.  Ultrasound was negative for DVT.  Discussed result with the patient.  She agrees to close follow-up with her PMD, Dr. Phillips Odor.  Alizae Bechtel L. [place], [place] 04/02/12 1129

## 2012-04-05 NOTE — ED Provider Notes (Signed)
Medical screening examination/treatment/procedure(s) were performed by non-physician practitioner and as supervising physician I was immediately available for consultation/collaboration.   Geoffery Lyons, MD 04/05/12 [phone]

## 2012-07-06 ENCOUNTER — Other Ambulatory Visit (HOSPITAL_COMMUNITY): Payer: Self-pay | Admitting: Family Medicine

## 2012-07-06 DIAGNOSIS — Z139 Encounter for screening, unspecified: Secondary | ICD-10-CM

## 2012-07-30 ENCOUNTER — Ambulatory Visit (HOSPITAL_COMMUNITY)
Admission: RE | Admit: 2012-07-30 | Discharge: 2012-07-30 | Disposition: A | Discharge status: Home / Self Care | Payer: BC Managed Care – PPO | Source: Ambulatory Visit | Attending: Family Medicine | Admitting: Family Medicine

## 2012-07-30 DIAGNOSIS — Z139 Encounter for screening, unspecified: Secondary | ICD-10-CM

## 2012-07-30 DIAGNOSIS — Z1231 Encounter for screening mammogram for malignant neoplasm of breast: Secondary | ICD-10-CM | POA: Insufficient documentation

## 2012-08-13 ENCOUNTER — Other Ambulatory Visit: Payer: Self-pay | Admitting: Adult Health

## 2012-08-13 ENCOUNTER — Other Ambulatory Visit (HOSPITAL_COMMUNITY)
Admission: RE | Admit: 2012-08-13 | Discharge: 2012-08-13 | Disposition: A | Discharge status: Home / Self Care | Payer: BC Managed Care – PPO | Source: Ambulatory Visit | Attending: Obstetrics and Gynecology | Admitting: Obstetrics and Gynecology

## 2012-08-13 DIAGNOSIS — Z1151 Encounter for screening for human papillomavirus (HPV): Secondary | ICD-10-CM | POA: Insufficient documentation

## 2012-08-13 DIAGNOSIS — Z01419 Encounter for gynecological examination (general) (routine) without abnormal findings: Secondary | ICD-10-CM | POA: Insufficient documentation

## 2013-08-13 ENCOUNTER — Other Ambulatory Visit (HOSPITAL_COMMUNITY): Payer: Self-pay | Admitting: Family Medicine

## 2013-08-13 DIAGNOSIS — Z139 Encounter for screening, unspecified: Secondary | ICD-10-CM

## 2013-08-19 ENCOUNTER — Ambulatory Visit (HOSPITAL_COMMUNITY)
Admission: RE | Admit: 2013-08-19 | Discharge: 2013-08-19 | Disposition: A | Discharge status: Home / Self Care | Payer: BC Managed Care – PPO | Source: Ambulatory Visit | Attending: Family Medicine | Admitting: Family Medicine

## 2013-08-19 DIAGNOSIS — Z1231 Encounter for screening mammogram for malignant neoplasm of breast: Secondary | ICD-10-CM | POA: Insufficient documentation

## 2013-08-19 DIAGNOSIS — Z139 Encounter for screening, unspecified: Secondary | ICD-10-CM

## 2014-01-23 ENCOUNTER — Other Ambulatory Visit (HOSPITAL_COMMUNITY)
Admission: RE | Admit: 2014-01-23 | Discharge: 2014-01-23 | Disposition: A | Discharge status: Home / Self Care | Payer: BC Managed Care – PPO | Source: Ambulatory Visit | Attending: Adult Health | Admitting: Adult Health

## 2014-01-23 ENCOUNTER — Ambulatory Visit (INDEPENDENT_AMBULATORY_CARE_PROVIDER_SITE_OTHER): Payer: BC Managed Care – PPO | Admitting: Adult Health

## 2014-01-23 ENCOUNTER — Encounter: Payer: Self-pay | Admitting: Adult Health

## 2014-01-23 VITALS — BP 120/68 | HR 74 | Ht 63.0 in | Wt 201.0 lb

## 2014-01-23 DIAGNOSIS — R102 Pelvic and perineal pain: Secondary | ICD-10-CM

## 2014-01-23 DIAGNOSIS — Z01419 Encounter for gynecological examination (general) (routine) without abnormal findings: Secondary | ICD-10-CM

## 2014-01-23 DIAGNOSIS — N816 Rectocele: Secondary | ICD-10-CM

## 2014-01-23 DIAGNOSIS — Z1151 Encounter for screening for human papillomavirus (HPV): Secondary | ICD-10-CM | POA: Insufficient documentation

## 2014-01-23 DIAGNOSIS — Z1212 Encounter for screening for malignant neoplasm of rectum: Secondary | ICD-10-CM

## 2014-01-23 DIAGNOSIS — Z1389 Encounter for screening for other disorder: Secondary | ICD-10-CM

## 2014-01-23 HISTORY — DX: Pelvic and perineal pain: R10.2

## 2014-01-23 HISTORY — DX: Rectocele: N81.6

## 2014-01-23 LAB — POCT URINALYSIS DIPSTICK
Blood, UA: NEGATIVE
GLUCOSE UA: NEGATIVE
LEUKOCYTES UA: NEGATIVE
Nitrite, UA: NEGATIVE

## 2014-01-23 LAB — HEMOCCULT GUIAC POC 1CARD (OFFICE): FECAL OCCULT BLD: NEGATIVE

## 2014-01-23 NOTE — Patient Instructions (Signed)
Physical in 1 year Mammogram yearly return in 2 weeks for [place] Labs at PCP Colonoscopy a per GI

## 2014-01-23 NOTE — Progress Notes (Signed)
Patient ID: Regina Berry, female   DOB: 1950-09-10, 63 y.o.   MRN: [id number] History of Present Illness: Regina Berry is a 63 year old white female, married, in for a pap and physical.She complains of some pelvic pressure at C section scar area.   Current Medications, Allergies, Past Medical History, Past Surgical History, Family History and Social History were reviewed in [employer] record.     Review of Systems: Patient denies any headaches, blurred vision, shortness of breath, chest pain, abdominal pain, problems with bowel movements, urination, or intercourse. No joint swelling or mood changes is taking meds.    Physical Exam:BP 120/68  Pulse 74  Ht 5\' 3"  (1.6 m)  Wt 201 lb (91.173 kg)  BMI 35.61 kg/m2urine trace protein General:  Well developed, well nourished, no acute distress Skin:  Warm and dry Neck:  Midline trachea, normal thyroid Lungs; Clear to auscultation bilaterally Breast:  No dominant palpable mass, retraction, or nipple discharge Cardiovascular: Regular rate and rhythm Abdomen:  Soft, non tender, no hepatosplenomegaly Pelvic:  External genitalia is normal in appearance.  The vagina has decrease color,moisture and rugae. The cervix is bulbous.Pap with HPV performed.  Uterus is felt to be normal size, shape, and contour.  No  adnexal masses or tenderness noted.Maybe a little tender over bladder area. Rectal: Good sphincter tone, no polyps, or hemorrhoids felt.  Hemoccult negative.+rectocele Extremities:  No swelling or rash noted Psych:  No mood changes, alert and cooperative,seems happy   Impression: Yearly gyn exam Pelvic pressure Rectocele     Plan: Physical in 1 year Mammogram yearly Labs with PCP Colonoscopy per GI Return in 2 weeks for [place] to assess pelvic pressure

## 2014-02-04 ENCOUNTER — Other Ambulatory Visit: Payer: Self-pay | Admitting: Adult Health

## 2014-02-04 DIAGNOSIS — R102 Pelvic and perineal pain: Secondary | ICD-10-CM

## 2014-02-06 ENCOUNTER — Ambulatory Visit: Payer: BC Managed Care – PPO | Admitting: Adult Health

## 2014-02-06 ENCOUNTER — Ambulatory Visit (INDEPENDENT_AMBULATORY_CARE_PROVIDER_SITE_OTHER): Payer: BC Managed Care – PPO

## 2014-02-06 ENCOUNTER — Telehealth: Payer: Self-pay | Admitting: Adult Health

## 2014-02-06 DIAGNOSIS — R102 Pelvic and perineal pain: Secondary | ICD-10-CM

## 2014-02-06 DIAGNOSIS — N9489 Other specified conditions associated with female genital organs and menstrual cycle: Secondary | ICD-10-CM

## 2014-02-06 NOTE — Telephone Encounter (Signed)
Uterus 4.2 x 4.3 x 2.6 cm, no myometrial masses noted  Endometrium 3.7 mm, symmetrical  Right ovary 1.8 x 1.1 x 0.9 cm,  Left ovary 1.5 x 1.4 x 1.0 cm,  No free fluid or adnexal masse noted within the pelvis  Technician Comments:  No myometrial masses noted, Endom-3.67mm, bilateral adnexa/ovaries appear WNL, no free fluid or adnexal masses noted within the pelvis   Called pt and reviewed [place] with her,if continues may be bowel.

## 2014-07-07 ENCOUNTER — Encounter: Payer: Self-pay | Admitting: Adult Health

## 2014-07-18 ENCOUNTER — Encounter (HOSPITAL_COMMUNITY): Payer: Self-pay | Admitting: Emergency Medicine

## 2014-07-18 ENCOUNTER — Emergency Department (HOSPITAL_COMMUNITY): Payer: BC Managed Care – PPO

## 2014-07-18 ENCOUNTER — Emergency Department (HOSPITAL_COMMUNITY)
Admission: EM | Admit: 2014-07-18 | Discharge: 2014-07-18 | Disposition: A | Discharge status: Home / Self Care | Payer: BC Managed Care – PPO | Attending: Emergency Medicine | Admitting: Emergency Medicine

## 2014-07-18 DIAGNOSIS — F419 Anxiety disorder, unspecified: Secondary | ICD-10-CM | POA: Diagnosis not present

## 2014-07-18 DIAGNOSIS — Y9289 Other specified places as the place of occurrence of the external cause: Secondary | ICD-10-CM | POA: Diagnosis not present

## 2014-07-18 DIAGNOSIS — Y998 Other external cause status: Secondary | ICD-10-CM | POA: Insufficient documentation

## 2014-07-18 DIAGNOSIS — Z8742 Personal history of other diseases of the female genital tract: Secondary | ICD-10-CM | POA: Insufficient documentation

## 2014-07-18 DIAGNOSIS — Z79899 Other long term (current) drug therapy: Secondary | ICD-10-CM | POA: Insufficient documentation

## 2014-07-18 DIAGNOSIS — Z720 Tobacco use: Secondary | ICD-10-CM | POA: Diagnosis not present

## 2014-07-18 DIAGNOSIS — I10 Essential (primary) hypertension: Secondary | ICD-10-CM | POA: Insufficient documentation

## 2014-07-18 DIAGNOSIS — W19XXXA Unspecified fall, initial encounter: Secondary | ICD-10-CM

## 2014-07-18 DIAGNOSIS — S93602A Unspecified sprain of left foot, initial encounter: Secondary | ICD-10-CM | POA: Diagnosis not present

## 2014-07-18 DIAGNOSIS — W1830XA Fall on same level, unspecified, initial encounter: Secondary | ICD-10-CM | POA: Diagnosis not present

## 2014-07-18 DIAGNOSIS — Y9389 Activity, other specified: Secondary | ICD-10-CM | POA: Insufficient documentation

## 2014-07-18 DIAGNOSIS — E079 Disorder of thyroid, unspecified: Secondary | ICD-10-CM | POA: Insufficient documentation

## 2014-07-18 DIAGNOSIS — S99922A Unspecified injury of left foot, initial encounter: Secondary | ICD-10-CM | POA: Diagnosis present

## 2014-07-18 DIAGNOSIS — M79673 Pain in unspecified foot: Secondary | ICD-10-CM

## 2014-07-18 MED ORDER — HYDROCODONE-ACETAMINOPHEN 5-325 MG PO TABS
1.0000 | ORAL_TABLET | Freq: Once | ORAL | Status: AC
  Administered 2014-07-18: 1 via ORAL
  Filled 2014-07-18: qty 1

## 2014-07-18 MED ORDER — HYDROCODONE-ACETAMINOPHEN 5-325 MG PO TABS: ORAL_TABLET | ORAL | Status: DC

## 2014-07-18 NOTE — ED Notes (Signed)
Patient reports she fell this afternoon. Complaining of left foot pain across top of foot.

## 2014-07-18 NOTE — Discharge Instructions (Signed)
Foot Sprain The muscles and cord like structures which attach muscle to bone (tendons) that surround the feet are made up of units. A foot sprain can occur at the weakest spot in any of these units. This condition is most often caused by injury to or overuse of the foot, as from playing contact sports, or aggravating a previous injury, or from poor conditioning, or obesity. SYMPTOMS  Pain with movement of the foot.  Tenderness and swelling at the injury site.  Loss of strength is present in moderate or severe sprains. THE THREE GRADES OR SEVERITY OF FOOT SPRAIN ARE:  Mild (Grade I): Slightly pulled muscle without tearing of muscle or tendon fibers or loss of strength.  Moderate (Grade II): Tearing of fibers in a muscle, tendon, or at the attachment to bone, with small decrease in strength.  Severe (Grade III): Rupture of the muscle-tendon-bone attachment, with separation of fibers. Severe sprain requires surgical repair. Often repeating (chronic) sprains are caused by overuse. Sudden (acute) sprains are caused by direct injury or over-use. DIAGNOSIS  Diagnosis of this condition is usually by your own observation. If problems continue, a caregiver may be required for further evaluation and treatment. X-rays may be required to make sure there are not breaks in the bones (fractures) present. Continued problems may require physical therapy for treatment. PREVENTION  Use strength and conditioning exercises appropriate for your sport.  Warm up properly prior to working out.  Use athletic shoes that are made for the sport you are participating in.  Allow adequate time for healing. Early return to activities makes repeat injury more likely, and can lead to an unstable arthritic foot that can result in prolonged disability. Mild sprains generally heal in 3 to 10 days, with moderate and severe sprains taking 2 to 10 weeks. Your caregiver can help you determine the proper time required for  healing. HOME CARE INSTRUCTIONS   Apply ice to the injury for 15-20 minutes, 03-04 times per day. Put the ice in a plastic bag and place a towel between the bag of ice and your skin.  An elastic wrap (like an Ace bandage) may be used to keep swelling down.  Keep foot above the level of the heart, or at least raised on a footstool, when swelling and pain are present.  Try to avoid use other than gentle range of motion while the foot is painful. Do not resume use until instructed by your caregiver. Then begin use gradually, not increasing use to the point of pain. If pain does develop, decrease use and continue the above measures, gradually increasing activities that do not cause discomfort, until you gradually achieve normal use.  Use crutches if and as instructed, and for the length of time instructed.  Keep injured foot and ankle wrapped between treatments.  Massage foot and ankle for comfort and to keep swelling down. Massage from the toes up towards the knee.  Only take over-the-counter or prescription medicines for pain, discomfort, or fever as directed by your caregiver. SEEK IMMEDIATE MEDICAL CARE IF:   Your pain and swelling increase, or pain is not controlled with medications.  You have loss of feeling in your foot or your foot turns cold or blue.  You develop new, unexplained symptoms, or an increase of the symptoms that brought you to your caregiver. MAKE SURE YOU:   Understand these instructions.  Will watch your condition.  Will get help right away if you are not doing well or get worse. Document Released:   02/11/2002 Document Revised: 11/14/2011 Document Reviewed: 04/10/2008 ExitCare Patient Information 2015 [employer]. This information is not intended to replace advice given to you by your health care provider. Make sure you discuss any questions you have with your health care provider.  

## 2014-07-18 NOTE — ED Provider Notes (Signed)
CSN: [id number]     Arrival date & time 07/18/14  2008 History   First MD Initiated Contact with Patient 07/18/14 2031     Chief Complaint  Patient presents with  . Foot Pain     (Consider location/radiation/quality/duration/timing/severity/associated sxs/prior Treatment) HPI  Regina Berry is a 63 y.o. female who presents to the Emergency Department complaining of left foot pain after a fall several hours ago.  She reports "twisting" her foot while coming off a step.  She c/o throbbing pain across the top of her left foot.  She denies numbness or weakness. She has applied ice with little relief.  She denies other injuries.     Past Medical History  Diagnosis Date  . Thyroid disease   . Hypertension   . Anxiety   . Pelvic pressure in female 01/23/2014  . Rectocele 01/23/2014   Past Surgical History  Procedure Laterality Date  . Cesarean section    . Lung surgery     Family History  Problem Relation Age of Onset  . Diabetes Mother   . Cancer Mother     ovarian  . Diabetes Father   . Cancer Father     lymphoma  . Heart disease Father    History  Substance Use Topics  . Smoking status: Current Every Day Smoker -- 0.50 packs/day    Types: Cigarettes  . Smokeless tobacco: Never Used  . Alcohol Use: Yes     Comment: occasionally   OB History    Gravida Para Term Preterm AB TAB SAB Ectopic Multiple Living   2 2        2      Review of Systems  Constitutional: Negative for fever and chills.  Musculoskeletal: Positive for joint swelling and arthralgias. Negative for neck pain.  Skin: Negative for color change and wound.  Neurological: Negative for dizziness, syncope, weakness, numbness and headaches.  All other systems reviewed and are negative.     Allergies  Codeine  Home Medications   Prior to Admission medications   Medication Sig Start Date End Date Taking? Authorizing Provider  ALPRAZolam Duanne Moron) 0.5 MG tablet Take 0.5 mg by mouth daily as needed.  anxiety    Historical Provider, MD  citalopram (CELEXA) 40 MG tablet Take 40 mg by mouth daily.  01/08/14   Historical Provider, MD  furosemide (LASIX) 20 MG tablet Take 20 mg by mouth daily as needed. fluid    Historical Provider, MD  HYDROcodone-acetaminophen (VICODIN) 5-500 MG per tablet Take 1 tablet by mouth 2 (two) times daily as needed. pain    Historical Provider, MD  levothyroxine (SYNTHROID, LEVOTHROID) 75 MCG tablet Take 75 mcg by mouth daily.    Historical Provider, MD  lisinopril-hydrochlorothiazide (PRINZIDE,ZESTORETIC) 20-25 MG per tablet Take 1 tablet by mouth daily.  01/08/14   Historical Provider, MD  zolpidem (AMBIEN) 10 MG tablet Take 5 mg by mouth at bedtime as needed. sleep    Historical Provider, MD   BP 113/57 mmHg  Pulse 92  Temp(Src) 99 F (37.2 C) (Oral)  Resp 18  Ht 5\' 3"  (1.6 m)  Wt 200 lb (90.719 kg)  BMI 35.44 kg/m2  SpO2 99% Physical Exam  Constitutional: She is oriented to person, place, and time. She appears well-developed and well-nourished. No distress.  HENT:  Head: Normocephalic and atraumatic.  Neck: Normal range of motion. Neck supple.  Cardiovascular: Normal rate, regular rhythm, normal heart sounds and intact distal pulses.   No murmur heard. Pulmonary/Chest: Effort  normal and breath sounds normal. No respiratory distress.  Musculoskeletal: She exhibits tenderness.  Localized ttp and mild STS of the dorsal left foot.  ROM is preserved.  DP pulse is brisk,distal sensation intact.  No erythema, abrasion, bruising or bony deformity.  No proximal tenderness.  Neurological: She is alert and oriented to person, place, and time. She exhibits normal muscle tone. Coordination normal.  Skin: Skin is warm and dry.  Nursing note and vitals reviewed.   ED Course  Procedures (including critical care time) Labs Review Labs Reviewed - No data to display  Imaging Review Dg Foot Complete Left  07/18/2014   CLINICAL DATA:  Pt c/o pain across dorsum  metatarsals today s/p fall at home while stepping out of outbuilding. Pt's foot slipped off of cinderblock stair, causing patient to overbalance and fall.  EXAM: LEFT FOOT - COMPLETE 3+ VIEW  COMPARISON:  None.  FINDINGS: Small Achilles and calcaneal spurs. No acute fracture or dislocation.  IMPRESSION: No acute osseous abnormality.   Electronically Signed   By: Abigail Miyamoto M.D.   On: 07/18/2014 21:05     EKG Interpretation None      MDM   Final diagnoses:  Foot pain  Fall    Patient with dorsal foot pain after a mechanical fall.  Neg XR.  ASO applied for support.  She agrees to RICE, ortho f/u in one week if needed.    Pt has own crutches.  ASO applied, pain improved  Remains NV intact  [name], PA-C 07/21/14 [place], MD 07/23/14 [phone]

## 2014-07-18 NOTE — ED Notes (Signed)
Patient with no complaints at this time. Respirations even and unlabored. Skin warm/dry. Discharge instructions reviewed with patient at this time. Patient given opportunity to voice concerns/ask questions. Patient discharged at this time and left Emergency Department with steady gait.   

## 2014-09-11 ENCOUNTER — Other Ambulatory Visit (HOSPITAL_COMMUNITY): Payer: Self-pay | Admitting: Family Medicine

## 2014-09-11 DIAGNOSIS — Z1231 Encounter for screening mammogram for malignant neoplasm of breast: Secondary | ICD-10-CM

## 2014-09-12 ENCOUNTER — Other Ambulatory Visit (HOSPITAL_COMMUNITY): Payer: Self-pay | Admitting: Respiratory Therapy

## 2014-09-12 DIAGNOSIS — M349 Systemic sclerosis, unspecified: Secondary | ICD-10-CM

## 2014-09-16 ENCOUNTER — Ambulatory Visit (HOSPITAL_COMMUNITY)
Admission: RE | Admit: 2014-09-16 | Discharge: 2014-09-16 | Disposition: A | Discharge status: Home / Self Care | Payer: BLUE CROSS/BLUE SHIELD | Source: Ambulatory Visit | Attending: Rheumatology | Admitting: Rheumatology

## 2014-09-16 DIAGNOSIS — I517 Cardiomegaly: Secondary | ICD-10-CM

## 2014-09-16 DIAGNOSIS — Z720 Tobacco use: Secondary | ICD-10-CM | POA: Insufficient documentation

## 2014-09-16 DIAGNOSIS — M349 Systemic sclerosis, unspecified: Secondary | ICD-10-CM | POA: Diagnosis not present

## 2014-09-16 DIAGNOSIS — R0600 Dyspnea, unspecified: Secondary | ICD-10-CM | POA: Diagnosis not present

## 2014-09-16 LAB — PULMONARY FUNCTION TEST
DL/VA % PRED: 82 %
DL/VA: 3.85 ml/min/mmHg/L
DLCO COR % PRED: 64 %
DLCO COR: 14.68 ml/min/mmHg
DLCO unc % pred: 64 %
DLCO unc: 14.68 ml/min/mmHg
FEF 25-75 Post: 1.13 L/sec
FEF 25-75 Pre: 1.2 L/sec
FEF2575-%CHANGE-POST: -5 %
FEF2575-%PRED-POST: 52 %
FEF2575-%PRED-PRE: 55 %
FEV1-%Change-Post: 0 %
FEV1-%PRED-POST: 67 %
FEV1-%Pred-Pre: 66 %
FEV1-POST: 1.59 L
FEV1-PRE: 1.58 L
FEV1FVC-%CHANGE-POST: 3 %
FEV1FVC-%Pred-Pre: 96 %
FEV6-%Change-Post: -2 %
FEV6-%Pred-Post: 69 %
FEV6-%Pred-Pre: 71 %
FEV6-POST: 2.07 L
FEV6-Pre: 2.12 L
FEV6FVC-%Pred-Post: 104 %
FEV6FVC-%Pred-Pre: 104 %
FVC-%CHANGE-POST: -2 %
FVC-%PRED-POST: 66 %
FVC-%Pred-Pre: 68 %
FVC-POST: 2.07 L
FVC-PRE: 2.12 L
POST FEV1/FVC RATIO: 77 %
POST FEV6/FVC RATIO: 100 %
Pre FEV1/FVC ratio: 74 %
Pre FEV6/FVC Ratio: 100 %
RV % pred: 119 %
RV: 2.39 L
TLC % pred: 94 %
TLC: 4.64 L

## 2014-09-16 MED ORDER — ALBUTEROL SULFATE (2.5 MG/3ML) 0.083% IN NEBU
2.5000 mg | INHALATION_SOLUTION | Freq: Once | RESPIRATORY_TRACT | Status: AC
  Administered 2014-09-16: 2.5 mg via RESPIRATORY_TRACT

## 2014-09-16 NOTE — Progress Notes (Signed)
  Echocardiogram 2D Echocardiogram has been performed.  [place], [place] 09/16/2014, 11:03 AM

## 2014-09-17 ENCOUNTER — Ambulatory Visit (HOSPITAL_COMMUNITY)
Admission: RE | Admit: 2014-09-17 | Discharge: 2014-09-17 | Disposition: A | Discharge status: Home / Self Care | Payer: BLUE CROSS/BLUE SHIELD | Source: Ambulatory Visit | Attending: Family Medicine | Admitting: Family Medicine

## 2014-09-17 DIAGNOSIS — Z1231 Encounter for screening mammogram for malignant neoplasm of breast: Secondary | ICD-10-CM | POA: Diagnosis not present

## 2015-05-06 ENCOUNTER — Other Ambulatory Visit: Payer: Self-pay | Admitting: Adult Health

## 2015-05-20 ENCOUNTER — Other Ambulatory Visit: Payer: Self-pay | Admitting: Adult Health

## 2015-06-23 ENCOUNTER — Telehealth: Payer: Self-pay | Admitting: *Deleted

## 2015-06-23 ENCOUNTER — Encounter: Payer: Self-pay | Admitting: Adult Health

## 2015-06-23 ENCOUNTER — Ambulatory Visit (INDEPENDENT_AMBULATORY_CARE_PROVIDER_SITE_OTHER): Payer: BLUE CROSS/BLUE SHIELD | Admitting: Adult Health

## 2015-06-23 ENCOUNTER — Ambulatory Visit (HOSPITAL_COMMUNITY)
Admission: RE | Admit: 2015-06-23 | Discharge: 2015-06-23 | Disposition: A | Discharge status: Home / Self Care | Payer: BLUE CROSS/BLUE SHIELD | Source: Ambulatory Visit | Attending: Adult Health | Admitting: Adult Health

## 2015-06-23 VITALS — BP 138/78 | HR 70 | Ht 63.0 in | Wt 207.0 lb

## 2015-06-23 DIAGNOSIS — R059 Cough, unspecified: Secondary | ICD-10-CM

## 2015-06-23 DIAGNOSIS — R05 Cough: Secondary | ICD-10-CM | POA: Insufficient documentation

## 2015-06-23 DIAGNOSIS — N816 Rectocele: Secondary | ICD-10-CM

## 2015-06-23 DIAGNOSIS — Z1212 Encounter for screening for malignant neoplasm of rectum: Secondary | ICD-10-CM | POA: Diagnosis not present

## 2015-06-23 DIAGNOSIS — R195 Other fecal abnormalities: Secondary | ICD-10-CM

## 2015-06-23 DIAGNOSIS — Z01419 Encounter for gynecological examination (general) (routine) without abnormal findings: Secondary | ICD-10-CM

## 2015-06-23 HISTORY — DX: Other fecal abnormalities: R19.5

## 2015-06-23 LAB — HEMOCCULT GUIAC POC 1CARD (OFFICE): Fecal Occult Blood, POC: POSITIVE — AB

## 2015-06-23 MED ORDER — BENZONATATE 100 MG PO CAPS: 100.0000 mg | ORAL_CAPSULE | Freq: Three times a day (TID) | ORAL | Status: DC | PRN

## 2015-06-23 MED ORDER — AZITHROMYCIN 250 MG PO TABS: ORAL_TABLET | ORAL | Status: DC

## 2015-06-23 NOTE — Patient Instructions (Signed)
Chest xray today Physical in  1year Do 3 cards and return Mammogram yearly Colonoscopy per Dr Trenton Gammon with PCP

## 2015-06-23 NOTE — Progress Notes (Signed)
Patient ID: Regina Berry, female   DOB: 08/26/1951, 64 y.o.   MRN: [id number] History of Present Illness: Dolce is a 64 year old white female,married in for a well woman gyn exam.She had a normal pap and negative HPV 01/23/14.She complains of a cough and felt bad since Friday,she says it is breaking up but has noticed any color to sputum, has been in the bed til today. She wonders if rectocele worse,will have small frequent BMs. PCP is Dr Hilma Favors.  Current Medications, Allergies, Past Medical History, Past Surgical History, Family History and Social History were reviewed in [employer] record.     Review of Systems: Patient denies any headaches, hearing loss, fatigue, blurred vision, shortness of breath, chest pain, abdominal pain, problems with  urination, or intercourse. No joint pain or mood swings.    Physical Exam:BP 138/78 mmHg  Pulse 70  Ht 5\' 3"  (1.6 m)  Wt 207 lb (93.895 kg)  BMI 36.68 kg/m2 General:  Well developed, well nourished, no acute distress Skin:  Warm and dry Neck:  Midline trachea, normal thyroid, good ROM, no lymphadenopathy Lungs: has bilateral crackles in lower lobes, with decreased air flow Breast:  No dominant palpable mass, retraction, or nipple discharge Cardiovascular: Regular rate and rhythm Abdomen:  Soft, non tender, no hepatosplenomegaly Pelvic:  External genitalia is normal in appearance, no lesions.  The vagina is pale with loss of moisture and rugae. Urethra has no lesions or masses. The cervix is smooth and atrophic.  Uterus is felt to be normal size, shape, and contour.  No adnexal masses or tenderness noted.Bladder is non tender, no masses felt. Rectal: Good sphincter tone, no polyps,internal hemorrhoids felt.+ low rectocele,  Hemoccult positive. Extremities/musculoskeletal:  No swelling or varicosities noted, has spider veins, no clubbing or cyanosis Psych:  No mood changes, alert and cooperative,seems  happy   Impression: Well woman gyn exam no pap Rectocele Cough  +hemoccult     Plan: Chest xray now, will call her with results and treat accordingly then Physical in 1 year Mammogram in January and yearly Will sent 3 hemoccult cards home,if any + will call Dr Laural Golden to get colonoscopy scheduled  Labs with PCP Colonoscopy due with Dr Laural Golden

## 2015-06-23 NOTE — Telephone Encounter (Signed)
Pt aware of chest xray will rx z pack and tessalon perles

## 2015-06-23 NOTE — Telephone Encounter (Signed)
JAG spoke with pt. JSY 

## 2015-07-02 ENCOUNTER — Telehealth: Payer: Self-pay | Admitting: *Deleted

## 2015-07-02 ENCOUNTER — Other Ambulatory Visit (INDEPENDENT_AMBULATORY_CARE_PROVIDER_SITE_OTHER): Payer: BLUE CROSS/BLUE SHIELD

## 2015-07-02 DIAGNOSIS — Z139 Encounter for screening, unspecified: Secondary | ICD-10-CM

## 2015-07-02 DIAGNOSIS — Z1212 Encounter for screening for malignant neoplasm of rectum: Secondary | ICD-10-CM | POA: Diagnosis not present

## 2015-07-02 DIAGNOSIS — R195 Other fecal abnormalities: Secondary | ICD-10-CM

## 2015-07-02 LAB — HEMOCCULT GUIAC POC 1CARD (OFFICE)
FECAL OCCULT BLD: NEGATIVE
FECAL OCCULT BLD: POSITIVE
Fecal Occult Blood, POC: POSITIVE — AB

## 2015-07-02 NOTE — Telephone Encounter (Signed)
Left message to call me in am 

## 2015-07-02 NOTE — Telephone Encounter (Signed)
Pt brought in hemocult cards. 2 cards were positive and 1 card was negative. JAG reviewed cards. JAG to call pt. [place]

## 2015-07-02 NOTE — Progress Notes (Signed)
Pt brought in hemocult cards. 2 cards were positive and 1 was negative. JAG reviewed cards. JAG to call pt. [place] [facility]

## 2015-07-03 NOTE — Telephone Encounter (Signed)
Pt aware 2 out of 3 cards +, will refer to Dr Laural Golden

## 2015-07-03 NOTE — Addendum Note (Signed)
Addended by: Derrek Monaco A on: 07/03/2015 08:47 AM   Modules accepted: Orders

## 2015-08-18 ENCOUNTER — Encounter (INDEPENDENT_AMBULATORY_CARE_PROVIDER_SITE_OTHER): Payer: Self-pay | Admitting: Internal Medicine

## 2015-08-18 ENCOUNTER — Telehealth (INDEPENDENT_AMBULATORY_CARE_PROVIDER_SITE_OTHER): Payer: Self-pay | Admitting: *Deleted

## 2015-08-18 ENCOUNTER — Other Ambulatory Visit (INDEPENDENT_AMBULATORY_CARE_PROVIDER_SITE_OTHER): Payer: Self-pay | Admitting: Internal Medicine

## 2015-08-18 ENCOUNTER — Ambulatory Visit (INDEPENDENT_AMBULATORY_CARE_PROVIDER_SITE_OTHER): Payer: BLUE CROSS/BLUE SHIELD | Admitting: Internal Medicine

## 2015-08-18 VITALS — BP 120/60 | HR 64 | Temp 97.9°F | Ht 63.5 in | Wt 201.7 lb

## 2015-08-18 DIAGNOSIS — F32A Depression, unspecified: Secondary | ICD-10-CM | POA: Insufficient documentation

## 2015-08-18 DIAGNOSIS — K6289 Other specified diseases of anus and rectum: Secondary | ICD-10-CM | POA: Diagnosis not present

## 2015-08-18 DIAGNOSIS — R195 Other fecal abnormalities: Secondary | ICD-10-CM

## 2015-08-18 DIAGNOSIS — Z1211 Encounter for screening for malignant neoplasm of colon: Secondary | ICD-10-CM

## 2015-08-18 DIAGNOSIS — F329 Major depressive disorder, single episode, unspecified: Secondary | ICD-10-CM | POA: Insufficient documentation

## 2015-08-18 MED ORDER — PEG 3350-KCL-NA BICARB-NACL 420 G PO SOLR: 4000.0000 mL | Freq: Once | ORAL | Status: DC

## 2015-08-18 NOTE — Patient Instructions (Signed)
Colonoscopy.  The risks and benefits such as perforation, bleeding, and infection were reviewed with the patient and is agreeable. 

## 2015-08-18 NOTE — Progress Notes (Signed)
Subjective:    Patient ID: Regina Berry, female    DOB: Nov 11, 1950, 64 y.o.   MRN: [id number]  HPI Referred to our office by Derrek Monaco NP for positive stool card\ x 2 in October. She tells me she has seen blood in her stool "right" often. Symptoms x 1 month. Stools are usually dark brown . She usually has a BM 1-2 a day.  No melena. Sometimes she has pain with her BMs. She describes the pain as tearing.  Her appetite is good.  No weight loss. There is no abdominal pain. She occasionally has acid reflux and takes OTC Prilosec or Mylanta.  Her last colonoscopy was about 11 yrs ago and she says it was normal.     12/09/2003 Total colonoscopy. Dr. Laural Golden   INDICATIONS: Regina Berry is a 64 year old Caucasian female with intermittent hematochezia who is undergoing diagnostic colonoscopy. At times she is constipated, at other times she has postprandial loose bowel movements. She is suspected ot have IBS. The procedure and risks were reviewed with the patient and informed consent was obtained. Family history is negative for colon carcinoma in first-degree relatives, but positive for CRC in maternal aunt.   FINAL DIAGNOSIS: Small external hemorrhoids with a prominent anal papilla, otherwise normal colonoscopy.      Review of Systems Past Medical History  Diagnosis Date  . Thyroid disease   . Hypertension   . Anxiety   . Pelvic pressure in female 01/23/2014  . Rectocele 01/23/2014  . 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 Positive fecal occult blood test 06/23/2015    Past Surgical History  Procedure Laterality Date  . Cesarean section    . Lung surgery      Allergies  Allergen Reactions  . Codeine Itching    Current Outpatient Prescriptions on File Prior to Visit  Medication Sig Dispense Refill  . ALPRAZolam (XANAX) 0.5 MG tablet Take 0.5 mg by mouth daily as needed. anxiety    . citalopram (CELEXA) 40 MG tablet Take 40 mg by mouth daily.     . furosemide  (LASIX) 20 MG tablet Take 20 mg by mouth daily as needed. fluid    . HYDROcodone-acetaminophen (NORCO/VICODIN) 5-325 MG per tablet Take one-two tabs po q 4-6 hrs prn pain 20 tablet 0  . levothyroxine (SYNTHROID, LEVOTHROID) 75 MCG tablet Take 75 mcg by mouth daily.    Marland Kitchen lisinopril-hydrochlorothiazide (PRINZIDE,ZESTORETIC) 20-25 MG per tablet Take by mouth daily. Takes half a tab daily    . NIFEdipine (PROCARDIA-XL/ADALAT CC) 30 MG 24 hr tablet Take 30 mg by mouth daily.    . Omega-3 Fatty Acids (FISH OIL) 1000 MG CAPS Take by mouth daily.    Marland Kitchen zolpidem (AMBIEN) 10 MG tablet Take 5 mg by mouth at bedtime as needed. sleep     No current facility-administered medications on file prior to visit.        Objective:   Physical ExamBlood pressure 120/60, pulse 64, temperature 97.9 F (36.6 C), height 5' 3.5" (1.613 m), weight 201 lb 11.2 oz (91.491 kg). Alert and oriented. Skin warm and dry. Oral mucosa is moist.   . Sclera anicteric, conjunctivae is pink. Thyroid not enlarged. No cervical lymphadenopathy. Lungs clear. Heart regular rate and rhythm.  Abdomen is soft. Bowel sounds are positive. No hepatomegaly. No abdominal masses felt. No tenderness.  No edema to lower extremities.  Stool brown and guaiac negative. No rectal pain during exam.    Lot [id number] Ex 9/17    Assessment &  Plan:  Heme + stool. Rectal pain. Colonic neoplasm needs to be ruled out. Hemorrhoids, polyps also in the differential. Colonoscopy. The risks and benefits such as perforation, bleeding, and infection were reviewed with the patient and is agreeable.

## 2015-08-18 NOTE — Telephone Encounter (Signed)
Patient needs trilyte 

## 2015-09-11 ENCOUNTER — Encounter (HOSPITAL_COMMUNITY): Payer: Self-pay | Admitting: *Deleted

## 2015-09-11 ENCOUNTER — Ambulatory Visit (HOSPITAL_COMMUNITY)
Admission: RE | Admit: 2015-09-11 | Discharge: 2015-09-11 | Disposition: A | Discharge status: Home / Self Care | Payer: BC Managed Care – PPO | Source: Ambulatory Visit | Attending: Internal Medicine | Admitting: Internal Medicine

## 2015-09-11 ENCOUNTER — Encounter (HOSPITAL_COMMUNITY)
Admission: RE | Disposition: A | Discharge status: Home / Self Care | Payer: Self-pay | Source: Ambulatory Visit | Attending: Internal Medicine

## 2015-09-11 DIAGNOSIS — F419 Anxiety disorder, unspecified: Secondary | ICD-10-CM | POA: Diagnosis not present

## 2015-09-11 DIAGNOSIS — F17210 Nicotine dependence, cigarettes, uncomplicated: Secondary | ICD-10-CM | POA: Insufficient documentation

## 2015-09-11 DIAGNOSIS — K644 Residual hemorrhoidal skin tags: Secondary | ICD-10-CM | POA: Diagnosis not present

## 2015-09-11 DIAGNOSIS — I10 Essential (primary) hypertension: Secondary | ICD-10-CM | POA: Insufficient documentation

## 2015-09-11 DIAGNOSIS — Z79899 Other long term (current) drug therapy: Secondary | ICD-10-CM | POA: Insufficient documentation

## 2015-09-11 DIAGNOSIS — K6289 Other specified diseases of anus and rectum: Secondary | ICD-10-CM | POA: Diagnosis not present

## 2015-09-11 DIAGNOSIS — E039 Hypothyroidism, unspecified: Secondary | ICD-10-CM | POA: Diagnosis not present

## 2015-09-11 DIAGNOSIS — R195 Other fecal abnormalities: Secondary | ICD-10-CM | POA: Diagnosis not present

## 2015-09-11 DIAGNOSIS — K6389 Other specified diseases of intestine: Secondary | ICD-10-CM | POA: Diagnosis not present

## 2015-09-11 DIAGNOSIS — K921 Melena: Secondary | ICD-10-CM | POA: Insufficient documentation

## 2015-09-11 DIAGNOSIS — K648 Other hemorrhoids: Secondary | ICD-10-CM | POA: Diagnosis not present

## 2015-09-11 HISTORY — PX: COLONOSCOPY: SHX5424

## 2015-09-11 HISTORY — DX: Hypothyroidism, unspecified: E03.9

## 2015-09-11 SURGERY — COLONOSCOPY
Anesthesia: Moderate Sedation

## 2015-09-11 MED ORDER — MIDAZOLAM HCL 5 MG/5ML IJ SOLN
INTRAMUSCULAR | Status: AC
  Filled 2015-09-11: qty 10

## 2015-09-11 MED ORDER — MIDAZOLAM HCL 5 MG/5ML IJ SOLN
INTRAMUSCULAR | Status: DC | PRN
  Administered 2015-09-11 (×5): 2 mg via INTRAVENOUS

## 2015-09-11 MED ORDER — MEPERIDINE HCL 50 MG/ML IJ SOLN
INTRAMUSCULAR | Status: AC
  Filled 2015-09-11: qty 1

## 2015-09-11 MED ORDER — BENEFIBER DRINK MIX PO PACK: 4.0000 g | PACK | Freq: Every day | ORAL | Status: DC

## 2015-09-11 MED ORDER — STERILE WATER FOR IRRIGATION IR SOLN
Status: DC | PRN
  Administered 2015-09-11: 2.5 mL

## 2015-09-11 MED ORDER — MEPERIDINE HCL 50 MG/ML IJ SOLN
INTRAMUSCULAR | Status: DC | PRN
  Administered 2015-09-11 (×2): 25 mg via INTRAVENOUS

## 2015-09-11 MED ORDER — SODIUM CHLORIDE 0.9 % IV SOLN
INTRAVENOUS | Status: DC
  Administered 2015-09-11: 1000 mL via INTRAVENOUS

## 2015-09-11 MED ORDER — HYDROCORTISONE ACETATE 25 MG RE SUPP: 25.0000 mg | Freq: Every day | RECTAL | Status: DC

## 2015-09-11 NOTE — H&P (Addendum)
Regina Berry is an 65 y.o. female.   Chief Complaint:  Patient is here for colonoscopy. HPI:  Patient is 65 year old Caucasian female less noted few episodes of hematochezia usually in the blood of tissue within the last 2 months. She had Hemoccults by Ms. Derrek Monaco NP and 2 Hemoccults were positive. Since she has noted blood in the stools she has been paying attention to her bowels and over the last several months she is having as many as 4 stools per day. She used to have 2 stools per day. Most of her stools are formed and then caliber. Some of her stools are loose and watery. She has urgency and has had a few accidents. She denies nocturnal bowel movements. She has not lost any weight. She also complains of intermittent rectal pain.  Last colonoscopy was in April 2005 and was normal other than external hemorrhoids.  Family history is negative for CRC.  Past Medical History  Diagnosis Date  . Thyroid disease   . Hypertension   . Anxiety   . Pelvic pressure in female 01/23/2014  . Rectocele 01/23/2014  . 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 Positive fecal occult blood test 06/23/2015  . Hypothyroidism     Past Surgical History  Procedure Laterality Date  . Cesarean section    . Lung surgery      Family History  Problem Relation Age of Onset  . Diabetes Mother   . Cancer Mother     ovarian  . COPD Mother   . Diabetes Father   . Cancer Father     lymphoma  . Heart disease Father   . Other Daughter     transverse mylitis   Social History:  reports that she has been smoking Cigarettes.  She has a 40 pack-year smoking history. She has never used smokeless tobacco. She reports that she drinks alcohol. She reports that she does not use illicit drugs.  Allergies:  Allergies  Allergen Reactions  . Codeine Itching    Medications Prior to Admission  Medication Sig Dispense Refill  . citalopram (CELEXA) 40 MG tablet Take 40 mg by mouth daily.     . furosemide (LASIX) 20 MG  tablet Take 20 mg by mouth daily as needed. fluid    . HYDROcodone-acetaminophen (NORCO/VICODIN) 5-325 MG per tablet Take one-two tabs po q 4-6 hrs prn pain 20 tablet 0  . levothyroxine (SYNTHROID, LEVOTHROID) 75 MCG tablet Take 75 mcg by mouth daily.    Marland Kitchen lisinopril-hydrochlorothiazide (PRINZIDE,ZESTORETIC) 20-25 MG per tablet Take by mouth daily. Takes half a tab daily    . NIFEdipine (PROCARDIA-XL/ADALAT CC) 30 MG 24 hr tablet Take 30 mg by mouth daily.    . Omega-3 Fatty Acids (FISH OIL) 1000 MG CAPS Take by mouth daily.    . polyethylene glycol-electrolytes (NULYTELY/GOLYTELY) 420 G solution Take 4,000 mLs by mouth once. 4000 mL 0  . zolpidem (AMBIEN) 10 MG tablet Take 5 mg by mouth at bedtime as needed. sleep    . ALPRAZolam (XANAX) 0.5 MG tablet Take 0.5 mg by mouth daily as needed. anxiety      No results found for this or any previous visit (from the past 48 hour(s)). No results found.  ROS  Blood pressure 156/67, pulse 64, temperature 99 F (37.2 C), temperature source Oral, resp. rate 20, height 5\' 4"  (1.626 m), weight 200 lb (90.719 kg), SpO2 98 %. Physical Exam  Constitutional: She appears well-developed and well-nourished.  HENT:  Mouth/Throat: Oropharynx is  clear and moist.  Eyes: Conjunctivae are normal. No scleral icterus.  Neck: No thyromegaly present.  Cardiovascular: Normal rate, regular rhythm and normal heart sounds.   No murmur heard. Respiratory: Effort normal and breath sounds normal.  GI: Soft. She exhibits no distension and no mass. There is no tenderness.  Musculoskeletal: She exhibits no edema.  Lymphadenopathy:    She has no cervical adenopathy.  Neurological: She is alert.  Skin: Skin is warm and dry.     Assessment/Plan  Hematochezia.  Heme positive stool and change in bowel habits.  Diagnostic colonoscopy  Prathik Aman U 09/11/2015, 2:17 PM

## 2015-09-11 NOTE — Op Note (Addendum)
COLONOSCOPY PROCEDURE REPORT  PATIENT:  Regina Berry  MR#:  [id number] Birthdate:  [birth year], 65 y.o., female Endoscopist:  Dr. Rogene Houston, MD Referred By:  Dr.  Purvis Kilts, MD Procedure Date: 09/11/2015  Procedure:   Colonoscopy  Indications:   Patient is 65 year old Caucasian female who noted change in her bowel habit several months ago.  She used to have 2 bowel movements per day but now she's having 4. Some of her stools are loose and she also has had accidents. She also has noted hematochezia in her stool was guaiac positive and she also complains of rectal pain. Last colonoscopy was in 2005 and was normal other than external hemorrhoids and anal papillae.  Informed Consent:  The procedure and risks were reviewed with the patient and informed consent was obtained.  Medications:  Demerol 50 mg IV Versed 10 mg IV   First dose administered at 1423 PM  Scope Out at 14:55 PM.  Description of procedure:  After a digital rectal exam was performed, that colonoscope was advanced from the anus through the rectum and colon to the area of the cecum, ileocecal valve and appendiceal orifice. The cecum was deeply intubated. These structures were well-seen and photographed for the record. From the level of the cecum and ileocecal valve, the scope was slowly and cautiously withdrawn. The mucosal surfaces were carefully surveyed utilizing scope tip to flexion to facilitate fold flattening as needed. The scope was pulled down into the rectum where a thorough exam including retroflexion was performed.  Findings:   Prep satisfactory. Normal mucosa of cecum, ascending colon, hepatic flexure, transverse colon, splenic flexure, descending and sigmoid colon. Normal rectal mucosa. Large anal papillae noted focal erythema along with hemorrhoids below the dentate line.   Therapeutic/Diagnostic Maneuvers Performed:   Random biopsies taken from mucosa of sigmoid colon looking for microscopic  colitis.  Complications: None  EBL: Minimal  Cecal Withdrawal Time:  16 minutes  Impression:  Normal colonoscopy except external hemorrhoids and prominent anal papillae. Random biopsies taken from mucosa of sigmoid colon for routine histology.   Recommendations:  Standard instructions given.  Benefiber 4 g by mouth daily at bedtime.  Anusol HC suppository 1 per rectum daily at bedtime for 2 weeks I will contact patient with biopsy results and further recommendations.  Fahad Cisse U  09/11/2015 3:03 PM  CC: Dr. Hilma Favors, Betsy Coder, MD & Dr. Rayne Du ref. provider found

## 2015-09-11 NOTE — Discharge Instructions (Signed)
Resume usual medications and diet.  Benefiber 4 g by mouth daily at bedtime.  No driving for 24 hours.  Physician will call with biopsy results.   Colonoscopy, Care After These instructions give you information on caring for yourself after your procedure. Your doctor may also give you more specific instructions. Call your doctor if you have any problems or questions after your procedure. HOME CARE  Do not drive for 24 hours.  Do not sign important papers or use machinery for 24 hours.  You may shower.  You may go back to your usual activities, but go slower for the first 24 hours.  Take rest breaks often during the first 24 hours.  Walk around or use warm packs on your belly (abdomen) if you have belly cramping or gas.  Drink enough fluids to keep your pee (urine) clear or pale yellow.  Resume your normal diet. Avoid heavy or fried foods.  Avoid drinking alcohol for 24 hours or as told by your doctor.  Only take medicines as told by your doctor. If a tissue sample (biopsy) was taken during the procedure:   Do not take aspirin or blood thinners for 7 days, or as told by your doctor.  Do not drink alcohol for 7 days, or as told by your doctor.  Eat soft foods for the first 24 hours. GET HELP IF: You still have a small amount of blood in your poop (stool) 2-3 days after the procedure. GET HELP RIGHT AWAY IF:  You have more than a small amount of blood in your poop.  You see clumps of tissue (blood clots) in your poop.  Your belly is puffy (swollen).  You feel sick to your stomach (nauseous) or throw up (vomit).  You have a fever.  You have belly pain that gets worse and medicine does not help. MAKE SURE YOU:  Understand these instructions.  Will watch your condition.  Will get help right away if you are not doing well or get worse.   This information is not intended to replace advice given to you by your health care provider. Make sure you discuss any  questions you have with your health care provider.   Document Released: 09/24/2010 Document Revised: 08/27/2013 Document Reviewed: 04/29/2013 Elsevier Interactive Patient Education [employer].

## 2015-09-17 ENCOUNTER — Encounter (HOSPITAL_COMMUNITY): Payer: Self-pay | Admitting: Internal Medicine

## 2015-12-30 ENCOUNTER — Other Ambulatory Visit: Payer: Self-pay | Admitting: Sports Medicine

## 2015-12-30 DIAGNOSIS — M545 Low back pain, unspecified: Secondary | ICD-10-CM

## 2015-12-30 DIAGNOSIS — G8929 Other chronic pain: Secondary | ICD-10-CM

## 2015-12-30 DIAGNOSIS — M5416 Radiculopathy, lumbar region: Secondary | ICD-10-CM

## 2016-01-01 ENCOUNTER — Ambulatory Visit
Admission: RE | Admit: 2016-01-01 | Discharge: 2016-01-01 | Disposition: A | Discharge status: Home / Self Care | Payer: BC Managed Care – PPO | Source: Ambulatory Visit | Attending: Sports Medicine | Admitting: Sports Medicine

## 2016-01-01 DIAGNOSIS — G8929 Other chronic pain: Secondary | ICD-10-CM

## 2016-01-01 DIAGNOSIS — M545 Low back pain, unspecified: Secondary | ICD-10-CM

## 2016-01-01 DIAGNOSIS — M5416 Radiculopathy, lumbar region: Secondary | ICD-10-CM

## 2016-01-01 MED ORDER — METHYLPREDNISOLONE ACETATE 40 MG/ML INJ SUSP (RADIOLOG
120.0000 mg | Freq: Once | INTRAMUSCULAR | Status: AC
  Administered 2016-01-01: 120 mg via EPIDURAL

## 2016-01-01 MED ORDER — IOHEXOL 180 MG/ML  SOLN
1.0000 mL | Freq: Once | INTRAMUSCULAR | Status: AC | PRN
  Administered 2016-01-01: 1 mL via EPIDURAL

## 2016-01-01 NOTE — Discharge Instructions (Signed)

## 2016-01-28 ENCOUNTER — Other Ambulatory Visit: Payer: Self-pay | Admitting: Sports Medicine

## 2016-01-28 DIAGNOSIS — M5416 Radiculopathy, lumbar region: Secondary | ICD-10-CM

## 2016-02-02 ENCOUNTER — Other Ambulatory Visit: Payer: BC Managed Care – PPO

## 2016-02-02 ENCOUNTER — Ambulatory Visit
Admission: RE | Admit: 2016-02-02 | Discharge: 2016-02-02 | Disposition: A | Discharge status: Home / Self Care | Payer: BC Managed Care – PPO | Source: Ambulatory Visit | Attending: Sports Medicine | Admitting: Sports Medicine

## 2016-02-02 DIAGNOSIS — M5416 Radiculopathy, lumbar region: Secondary | ICD-10-CM

## 2016-02-02 MED ORDER — IOPAMIDOL (ISOVUE-M 200) INJECTION 41%
1.0000 mL | Freq: Once | INTRAMUSCULAR | Status: AC
  Administered 2016-02-02: 1 mL via EPIDURAL

## 2016-02-02 MED ORDER — METHYLPREDNISOLONE ACETATE 40 MG/ML INJ SUSP (RADIOLOG
120.0000 mg | Freq: Once | INTRAMUSCULAR | Status: AC
  Administered 2016-02-02: 120 mg via EPIDURAL

## 2016-02-02 NOTE — Discharge Instructions (Signed)

## 2016-04-05 ENCOUNTER — Other Ambulatory Visit: Payer: Self-pay | Admitting: Sports Medicine

## 2016-04-05 DIAGNOSIS — M545 Low back pain, unspecified: Secondary | ICD-10-CM

## 2016-04-05 DIAGNOSIS — G8929 Other chronic pain: Secondary | ICD-10-CM

## 2016-05-18 ENCOUNTER — Other Ambulatory Visit (HOSPITAL_COMMUNITY): Payer: Self-pay | Admitting: Respiratory Therapy

## 2016-05-18 DIAGNOSIS — M349 Systemic sclerosis, unspecified: Secondary | ICD-10-CM

## 2016-06-02 ENCOUNTER — Other Ambulatory Visit (HOSPITAL_COMMUNITY): Payer: BC Managed Care – PPO

## 2016-06-23 ENCOUNTER — Other Ambulatory Visit (HOSPITAL_COMMUNITY)
Admission: RE | Admit: 2016-06-23 | Discharge: 2016-06-23 | Disposition: A | Discharge status: Home / Self Care | Payer: BC Managed Care – PPO | Source: Ambulatory Visit | Attending: Obstetrics and Gynecology | Admitting: Obstetrics and Gynecology

## 2016-06-23 ENCOUNTER — Ambulatory Visit (INDEPENDENT_AMBULATORY_CARE_PROVIDER_SITE_OTHER): Payer: BC Managed Care – PPO | Admitting: Obstetrics and Gynecology

## 2016-06-23 ENCOUNTER — Encounter: Payer: Self-pay | Admitting: Obstetrics and Gynecology

## 2016-06-23 ENCOUNTER — Encounter (INDEPENDENT_AMBULATORY_CARE_PROVIDER_SITE_OTHER): Payer: Self-pay

## 2016-06-23 DIAGNOSIS — Z1151 Encounter for screening for human papillomavirus (HPV): Secondary | ICD-10-CM | POA: Insufficient documentation

## 2016-06-23 DIAGNOSIS — Z01419 Encounter for gynecological examination (general) (routine) without abnormal findings: Secondary | ICD-10-CM | POA: Diagnosis not present

## 2016-06-23 NOTE — Progress Notes (Signed)
Patient ID: Regina Berry, female   DOB: Apr 27, 1951, 65 y.o.   MRN: [id number] Assessment:  Annual Gyn Exam Rectocele   Plan:  1. pap smear done, next pap due 1 year 2. return annually or prn  3. Pt will return in Jan to address rectocele, splinting technique reviewed. 3    Annual mammogram advised Subjective:  Regina Berry is a 65 y.o. female G2P2 who presents for annual exam. No LMP recorded. Patient is postmenopausal. The patient has complaints today of no complaints at this time. Pt states that she has annual pap smears completed. Pt reports that she has had a mammogram completed recently. Pt denies completing self breast exams. Pt states that she is aware that she has a rectocele. Pt notes that she has a PCP and that she uses xanax twice a month.  Pt has not tried any medications for the relief of her symptoms. Pt denies any other symptoms.    The following portions of the patient's history were reviewed and updated as appropriate: allergies, current medications, past family history, past medical history, past social history, past surgical history and problem list. Past Medical History:  Diagnosis Date  . Anxiety   . Hypertension   . Hypothyroidism   . Pelvic pressure in female 01/23/2014  . Positive fecal occult blood test 06/23/2015  . Rectocele 01/23/2014  . 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 Thyroid disease     Past Surgical History:  Procedure Laterality Date  . CESAREAN SECTION    . COLONOSCOPY N/A 09/11/2015   Procedure: COLONOSCOPY;  Surgeon: Rogene Houston, MD;  Location: [facility];  Service: Endoscopy;  Laterality: N/A;  moved to 1/6 @ 1:35 - Ann to notify pt  . LUNG SURGERY       Current Outpatient Prescriptions:  .  ALPRAZolam (XANAX) 0.5 MG tablet, Take 0.5 mg by mouth daily as needed. anxiety, Disp: , Rfl:  .  citalopram (CELEXA) 40 MG tablet, Take 40 mg by mouth daily. , Disp: , Rfl:  .  furosemide (LASIX) 20 MG tablet, Take 20 mg by mouth daily as needed.  fluid, Disp: , Rfl:  .  HYDROcodone-acetaminophen (NORCO/VICODIN) 5-325 MG per tablet, Take one-two tabs po q 4-6 hrs prn pain, Disp: 20 tablet, Rfl: 0 .  hydrocortisone (ANUSOL-HC) 25 MG suppository, Place 1 suppository (25 mg total) rectally at bedtime., Disp: 14 suppository, Rfl: 1 .  levothyroxine (SYNTHROID, LEVOTHROID) 75 MCG tablet, Take 75 mcg by mouth daily., Disp: , Rfl:  .  lisinopril-hydrochlorothiazide (PRINZIDE,ZESTORETIC) 20-25 MG per tablet, Take by mouth daily. Takes half a tab daily, Disp: , Rfl:  .  NIFEdipine (PROCARDIA-XL/ADALAT CC) 30 MG 24 hr tablet, Take 30 mg by mouth daily., Disp: , Rfl:  .  Omega-3 Fatty Acids (FISH OIL) 1000 MG CAPS, Take by mouth daily., Disp: , Rfl:  .  zolpidem (AMBIEN) 10 MG tablet, Take 5 mg by mouth at bedtime as needed. sleep, Disp: , Rfl:  .  Wheat Dextrin (BENEFIBER DRINK MIX) PACK, Take 4 g by mouth at bedtime. (Patient not taking: Reported on 06/23/2016), Disp: , Rfl:   Review of Systems Constitutional: negative Gastrointestinal: negative Genitourinary: negative  Objective:  There were no vitals taken for this visit.   BMI: There is no height or weight on file to calculate BMI.  General Appearance: Alert, appropriate appearance for age. No acute distress HEENT: Grossly normal Neck / Thyroid:  Cardiovascular: RRR; normal S1, S2, no murmur Lungs: CTA bilaterally Back:  No CVAT Breast Exam: No dimpling, nipple retraction or discharge. No masses or nodes. and No masses or nodes.No dimpling, nipple retraction or discharge. Gastrointestinal: Soft, non-tender, no masses or organomegaly Pelvic Exam: Vulva and vagina appear normal. Bimanual exam reveals normal uterus and adnexa. Vaginal: normal mucosa without prolapse or lesions, normal without tenderness, induration or masses and normal rugae Cervix: normal appearance Adnexa: normal bimanual exam Uterus: normal single, nontender Rectal exam: stool guaiac negative, rectocele  present Lymphatic Exam: Non-palpable nodes in neck, clavicular, axillary, or inguinal regions Skin: no rash or abnormalities Neurologic: Normal gait and speech, no tremor  Psychiatric: Alert and oriented, appropriate affect.  Urinalysis:Not done  Mallory Shirk. MD Pgr [phone] 4:33 PM   By signing my name below, I, Soijett Blue, attest that this documentation has been prepared under the direction and in the presence of Jonnie Kind, MD. Electronically Signed: Soijett Blue, ED Scribe. 06/23/16. 4:33 PM.  I personally performed the services described in this documentation, which was SCRIBED in my presence. The recorded information has been reviewed and considered accurate. It has been edited as necessary during review. Jonnie Kind, MD

## 2016-06-28 LAB — CYTOLOGY - PAP
Diagnosis: NEGATIVE
HPV: NOT DETECTED

## 2016-07-11 ENCOUNTER — Other Ambulatory Visit (HOSPITAL_COMMUNITY): Payer: BC Managed Care – PPO

## 2016-07-12 ENCOUNTER — Ambulatory Visit (HOSPITAL_COMMUNITY)
Admission: RE | Admit: 2016-07-12 | Discharge: 2016-07-12 | Disposition: A | Discharge status: Home / Self Care | Payer: Medicare Other | Source: Ambulatory Visit | Attending: Rheumatology | Admitting: Rheumatology

## 2016-07-12 DIAGNOSIS — M349 Systemic sclerosis, unspecified: Secondary | ICD-10-CM | POA: Insufficient documentation

## 2016-07-12 DIAGNOSIS — L940 Localized scleroderma [morphea]: Secondary | ICD-10-CM

## 2016-07-12 LAB — ECHOCARDIOGRAM COMPLETE
CHL CUP DOP CALC LVOT VTI: 25.3 cm
CHL CUP MV DEC (S): 313
CHL CUP STROKE VOLUME: 38 mL
E decel time: 313 msec
EERAT: 5.88
FS: 36 % (ref 28–44)
IV/PV OW: 0.91
LA ID, A-P, ES: 42 mm
LA diam end sys: 42 mm
LA diam index: 2.04 cm/m2
LA vol A4C: 66.9 ml
LAVOL: 67.2 mL
LAVOLIN: 32.6 mL/m2
LDCA: 3.14 cm2
LV E/e'average: 5.88
LV SIMPSON'S DISK: 60
LV dias vol: 63 mL (ref 46–106)
LV e' LATERAL: 11.4 cm/s
LV sys vol index: 12 mL/m2
LVDIAVOLIN: 31 mL/m2
LVEEMED: 5.88
LVOT SV: 79 mL
LVOT peak grad rest: 6 mmHg
LVOTD: 20 mm
LVOTPV: 123 cm/s
LVSYSVOL: 25 mL (ref 14–42)
MV pk A vel: 76.2 m/s
MV pk E vel: 67 m/s
PW: 10.5 mm — AB (ref 0.6–1.1)
RV LATERAL S' VELOCITY: 12.5 cm/s
RV TAPSE: 22.2 mm
TDI e' lateral: 11.4
TDI e' medial: 9.14

## 2016-07-12 NOTE — Progress Notes (Signed)
*  PRELIMINARY RESULTS* Echocardiogram 2D Echocardiogram has been performed.  Regina Berry 07/12/2016, 3:03 PM

## 2016-07-13 ENCOUNTER — Ambulatory Visit (HOSPITAL_COMMUNITY)
Admission: RE | Admit: 2016-07-13 | Discharge: 2016-07-13 | Disposition: A | Discharge status: Home / Self Care | Payer: Medicare Other | Source: Ambulatory Visit | Attending: Rheumatology | Admitting: Rheumatology

## 2016-07-13 DIAGNOSIS — M349 Systemic sclerosis, unspecified: Secondary | ICD-10-CM | POA: Diagnosis not present

## 2016-07-13 LAB — PULMONARY FUNCTION TEST
DL/VA % pred: 81 %
DL/VA: 3.82 ml/min/mmHg/L
DLCO UNC % PRED: 63 %
DLCO unc: 14.54 ml/min/mmHg

## 2016-08-23 DIAGNOSIS — Z1389 Encounter for screening for other disorder: Secondary | ICD-10-CM | POA: Diagnosis not present

## 2016-08-23 DIAGNOSIS — J209 Acute bronchitis, unspecified: Secondary | ICD-10-CM | POA: Diagnosis not present

## 2016-08-23 DIAGNOSIS — Z6834 Body mass index (BMI) 34.0-34.9, adult: Secondary | ICD-10-CM | POA: Diagnosis not present

## 2016-08-23 DIAGNOSIS — E782 Mixed hyperlipidemia: Secondary | ICD-10-CM | POA: Diagnosis not present

## 2016-09-14 ENCOUNTER — Emergency Department (HOSPITAL_COMMUNITY): Payer: Medicare Other

## 2016-09-14 ENCOUNTER — Encounter (HOSPITAL_COMMUNITY): Payer: Self-pay

## 2016-09-14 ENCOUNTER — Emergency Department (HOSPITAL_COMMUNITY)
Admission: EM | Admit: 2016-09-14 | Discharge: 2016-09-15 | Disposition: A | Discharge status: Home / Self Care | Payer: Medicare Other | Attending: Emergency Medicine | Admitting: Emergency Medicine

## 2016-09-14 DIAGNOSIS — E782 Mixed hyperlipidemia: Secondary | ICD-10-CM | POA: Diagnosis not present

## 2016-09-14 DIAGNOSIS — Z79899 Other long term (current) drug therapy: Secondary | ICD-10-CM | POA: Diagnosis not present

## 2016-09-14 DIAGNOSIS — S59292A Other physeal fracture of lower end of radius, left arm, initial encounter for closed fracture: Secondary | ICD-10-CM | POA: Diagnosis not present

## 2016-09-14 DIAGNOSIS — S52592A Other fractures of lower end of left radius, initial encounter for closed fracture: Secondary | ICD-10-CM | POA: Diagnosis not present

## 2016-09-14 DIAGNOSIS — J069 Acute upper respiratory infection, unspecified: Secondary | ICD-10-CM | POA: Diagnosis not present

## 2016-09-14 DIAGNOSIS — E039 Hypothyroidism, unspecified: Secondary | ICD-10-CM | POA: Insufficient documentation

## 2016-09-14 DIAGNOSIS — F17210 Nicotine dependence, cigarettes, uncomplicated: Secondary | ICD-10-CM | POA: Diagnosis not present

## 2016-09-14 DIAGNOSIS — S52612A Displaced fracture of left ulna styloid process, initial encounter for closed fracture: Secondary | ICD-10-CM | POA: Diagnosis not present

## 2016-09-14 DIAGNOSIS — Y92000 Kitchen of unspecified non-institutional (private) residence as  the place of occurrence of the external cause: Secondary | ICD-10-CM | POA: Insufficient documentation

## 2016-09-14 DIAGNOSIS — Z6836 Body mass index (BMI) 36.0-36.9, adult: Secondary | ICD-10-CM | POA: Diagnosis not present

## 2016-09-14 DIAGNOSIS — J029 Acute pharyngitis, unspecified: Secondary | ICD-10-CM | POA: Diagnosis not present

## 2016-09-14 DIAGNOSIS — I10 Essential (primary) hypertension: Secondary | ICD-10-CM | POA: Insufficient documentation

## 2016-09-14 DIAGNOSIS — R062 Wheezing: Secondary | ICD-10-CM | POA: Diagnosis not present

## 2016-09-14 DIAGNOSIS — J343 Hypertrophy of nasal turbinates: Secondary | ICD-10-CM | POA: Diagnosis not present

## 2016-09-14 DIAGNOSIS — W010XXA Fall on same level from slipping, tripping and stumbling without subsequent striking against object, initial encounter: Secondary | ICD-10-CM | POA: Insufficient documentation

## 2016-09-14 DIAGNOSIS — Z1389 Encounter for screening for other disorder: Secondary | ICD-10-CM | POA: Diagnosis not present

## 2016-09-14 DIAGNOSIS — R070 Pain in throat: Secondary | ICD-10-CM | POA: Diagnosis not present

## 2016-09-14 DIAGNOSIS — Y939 Activity, unspecified: Secondary | ICD-10-CM | POA: Insufficient documentation

## 2016-09-14 DIAGNOSIS — S52502A Unspecified fracture of the lower end of left radius, initial encounter for closed fracture: Secondary | ICD-10-CM | POA: Diagnosis not present

## 2016-09-14 DIAGNOSIS — Y999 Unspecified external cause status: Secondary | ICD-10-CM | POA: Diagnosis not present

## 2016-09-14 DIAGNOSIS — S59912A Unspecified injury of left forearm, initial encounter: Secondary | ICD-10-CM | POA: Diagnosis present

## 2016-09-14 NOTE — ED Triage Notes (Signed)
Pt took .5 Ambien  1930 and her Tussionex  1930 and aat 1130.Marland Kitchen

## 2016-09-14 NOTE — ED Provider Notes (Signed)
[facility] DEPT Provider Note   CSN: [id number] Arrival date & time: 09/14/16  2259  By signing my name below, I, Dyke Brackett, attest that this documentation has been prepared under the direction and in the presence of Ripley Fraise, MD . Electronically Signed: Dyke Brackett, Scribe. 09/14/2016. 11:33 PM.   History   Chief Complaint Chief Complaint  Patient presents with  . Fall    HPI Regina Berry is a 66 y.o. female who presents to the Emergency Department complaining sudden onset, constant left wrist pain s/p mechanical fall 30 minutes ago. Pt states she slipped on the kitchen floor and fell, bracing her fall with her left arm. She is unsure if she was dizzy before her fall, but states she felt dizzy and nauseated when she stood up. She describes her wrist pain as dull and aching and rates it 4/10 in severity. No treatments tired PTA. No alleviating or modifying factors noted.  Pt was dx with bronchitis by her PCP and started on Tussionex and Zpack recently. Per pt, she had taken Tussionex and Ambien at around the same time this evening. No other changes to medication. She does not have a hx of falls. She has since ambulated without difficulty. Pt reports she had an episode of post-tussive vomiting yesterday. She denies any headache, neck pain, back pain, syncope, CP, SOB or any other symptoms. Pt has no other complaints at this time.   The history is provided by the patient. No language interpreter was used.   Past Medical History:  Diagnosis Date  . Anxiety   . Hypertension   . Hypothyroidism   . Pelvic pressure in female 01/23/2014  . Positive fecal occult blood test 06/23/2015  . Rectocele 01/23/2014  . 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 Thyroid disease     Patient Active Problem List   Diagnosis Date Noted  . Depression 08/18/2015  . Positive fecal occult blood test 06/23/2015  . Pelvic pressure in female 01/23/2014  . Rectocele 01/23/2014    Past Surgical  History:  Procedure Laterality Date  . CESAREAN SECTION    . COLONOSCOPY N/A 09/11/2015   Procedure: COLONOSCOPY;  Surgeon: Rogene Houston, MD;  Location: [facility];  Service: Endoscopy;  Laterality: N/A;  moved to 1/6 @ 1:35 - Ann to notify pt  . LUNG SURGERY      OB History    Gravida Para Term Preterm AB Living   2 2       2    SAB TAB Ectopic Multiple Live Births           2      Home Medications    Prior to Admission medications   Medication Sig Start Date End Date Taking? Authorizing Provider  ALPRAZolam Duanne Moron) 0.5 MG tablet Take 0.5 mg by mouth daily as needed. anxiety   Yes Historical Provider, MD  chlorpheniramine-HYDROcodone (TUSSIONEX) 10-8 MG/5ML SUER Take 5 mLs by mouth.   Yes Historical Provider, MD  citalopram (CELEXA) 40 MG tablet Take 40 mg by mouth daily.  01/08/14  Yes Historical Provider, MD  furosemide (LASIX) 20 MG tablet Take 20 mg by mouth daily as needed. fluid   Yes Historical Provider, MD  HYDROcodone-acetaminophen (NORCO/VICODIN) 5-325 MG per tablet Take one-two tabs po q 4-6 hrs prn pain 07/18/14  Yes Tammy Triplett, PA-C  levothyroxine (SYNTHROID, LEVOTHROID) 75 MCG tablet Take 75 mcg by mouth daily.   Yes Historical Provider, MD  lisinopril-hydrochlorothiazide (PRINZIDE,ZESTORETIC) 20-25 MG per tablet Take by  mouth daily. Takes half a tab daily 01/08/14  Yes Historical Provider, MD  NIFEdipine (PROCARDIA-XL/ADALAT CC) 30 MG 24 hr tablet Take 30 mg by mouth daily.   Yes Historical Provider, MD  Omega-3 Fatty Acids (FISH OIL) 1000 MG CAPS Take by mouth daily.   Yes Historical Provider, MD  zolpidem (AMBIEN) 10 MG tablet Take 5 mg by mouth at bedtime as needed. sleep   Yes Historical Provider, MD  hydrocortisone (ANUSOL-HC) 25 MG suppository Place 1 suppository (25 mg total) rectally at bedtime. 09/11/15   Rogene Houston, MD  Wheat Dextrin (BENEFIBER DRINK MIX) PACK Take 4 g by mouth at bedtime. Patient not taking: Reported on 06/23/2016 09/11/15   Rogene Houston, MD    Family History Family History  Problem Relation Age of Onset  . Diabetes Mother   . Cancer Mother     ovarian  . COPD Mother   . Diabetes Father   . Cancer Father     lymphoma  . Heart disease Father   . Other Daughter     transverse mylitis    Social History Social History  Substance Use Topics  . Smoking status: Current Every Day Smoker    Packs/day: 1.00    Years: 40.00    Types: Cigarettes  . Smokeless tobacco: Never Used     Comment: smoking x 40 yrs. Down to 4 cigarettes a day x 2 weeks  . Alcohol use 0.0 oz/week     Comment: occasionally     Allergies   Codeine  Review of Systems Review of Systems 10 systems reviewed and all are negative for acute change except as noted in the HPI.  Physical Exam Updated Vital Signs BP 109/57 (BP Location: Right Arm)   Pulse 91   Temp 99.1 F (37.3 C) (Oral)   Resp 16   Ht 5\' 4"  (1.626 m)   Wt 200 lb (90.7 kg)   SpO2 95%   BMI 34.33 kg/m   Physical Exam CONSTITUTIONAL: Well developed/well nourished HEAD: Normocephalic/atraumatic EYES: EOMI/PERRL ENMT: Mucous membranes moist NECK: supple no meningeal signs SPINE/BACK:entire spine nontender CV: S1/S2 noted, no murmurs/rubs/gallops noted LUNGS: Lungs are clear to auscultation bilaterally, no apparent distress ABDOMEN: soft, nontender, no rebound or guarding, bowel sounds noted throughout abdomen GU:no cva tenderness NEURO: Pt is awake/alert/appropriate, moves all extremitiesx4.  No facial droop.   EXTREMITIES: pulses normal/equal, full ROM, tenderness and swelling to ulnar aspect of left wrist. No other deformities noted, All other extremities/joints palpated/ranged and nontender  SKIN: warm, color normal PSYCH: no abnormalities of mood noted, alert and oriented to situation   ED Treatments / Results  DIAGNOSTIC STUDIES:  Oxygen Saturation is 95% on RA, adequate by my interpretation.    COORDINATION OF CARE:  11:30 PM Discussed treatment  plan with pt at bedside and pt agreed to plan.   Labs (all labs ordered are listed, but only abnormal results are displayed) Labs Reviewed  URINALYSIS, ROUTINE W REFLEX MICROSCOPIC - Abnormal; Notable for the following:       Result Value   APPearance HAZY (*)    Protein, ur 100 (*)    Leukocytes, UA TRACE (*)    Bacteria, UA RARE (*)    All other components within normal limits  I-STAT CHEM 8, ED    EKG  EKG Interpretation  Date/Time:  Wednesday September 14 2016 23:34:29 EST Ventricular Rate:  81 PR Interval:    QRS Duration: 104 QT Interval:  401 QTC Calculation: 466  R Axis:   179 Text Interpretation:  Sinus rhythm Right axis deviation Low voltage, precordial leads No significant change since last tracing Confirmed by Christy Gentles  MD, Nettie ([id number]) on 09/14/2016 11:53:42 PM       Radiology Dg Wrist Complete Left  Result Date: 09/15/2016 CLINICAL DATA:  Status post fall, with left wrist pain and decreased range of motion. Initial encounter. EXAM: LEFT WRIST - COMPLETE 3+ VIEW COMPARISON:  None. FINDINGS: There is a comminuted fracture involving the distal radial metaphysis, without significant displacement or angulation. A mildly displaced ulnar styloid fracture is seen. The carpal rows appear grossly intact, and demonstrate normal alignment. Soft tissue swelling is noted about the wrist. IMPRESSION: Comminuted fracture involving the distal radial metaphysis, without significant displacement or angulation. Mildly displaced ulnar styloid fracture seen. Electronically Signed   By: Garald Balding M.D.   On: 09/15/2016 00:12    Procedures Procedures  SPLINT APPLICATION Date/Time: [id number] Authorized by: Sharyon Cable Consent: Verbal consent obtained. Risks and benefits: risks, benefits and alternatives were discussed Consent given by: patient Splint applied by: nurse Location details: left upper extremity Splint type: sugartong Supplies used: ortho glass Post-procedure: The  splinted body part was neurovascularly unchanged following the procedure. Patient tolerance: Patient tolerated the procedure well with no immediate complications.     Medications Ordered in ED Medications - No data to display   Initial Impression / Assessment and Plan / ED Course  I have reviewed the triage vital signs and the nursing notes.  Pertinent labs & imaging results that were available during my care of the patient were reviewed by me and considered in my medical decision making (see chart for details).  Clinical Course     Pt with isolated left distal radius fx No other injuries She was unsure why she fell but I suspect it was due to ambien/tussionex No other symptoms now Labs/ekg unrevealing Will d/c with ortho followup She declines pain meds   Final Clinical Impressions(s) / ED Diagnoses   Final diagnoses:  Other closed fracture of distal end of left radius, initial encounter    New Prescriptions New Prescriptions   No medications on file  I personally performed the services described in this documentation, which was scribed in my presence. The recorded information has been reviewed and is accurate.        Ripley Fraise, MD 09/15/16 [phone]

## 2016-09-14 NOTE — ED Triage Notes (Addendum)
Pt stated that she was in the kitchen and slipped and  Fell anout 30 min ago. Pt not sure if she was dizzy before falling.

## 2016-09-15 DIAGNOSIS — S52612A Displaced fracture of left ulna styloid process, initial encounter for closed fracture: Secondary | ICD-10-CM | POA: Diagnosis not present

## 2016-09-15 DIAGNOSIS — S59292A Other physeal fracture of lower end of radius, left arm, initial encounter for closed fracture: Secondary | ICD-10-CM | POA: Diagnosis not present

## 2016-09-15 LAB — URINALYSIS, ROUTINE W REFLEX MICROSCOPIC
BILIRUBIN URINE: NEGATIVE
Glucose, UA: NEGATIVE mg/dL
HGB URINE DIPSTICK: NEGATIVE
Ketones, ur: NEGATIVE mg/dL
NITRITE: NEGATIVE
PROTEIN: 100 mg/dL — AB
Specific Gravity, Urine: 1.023 (ref 1.005–1.030)
WBC UA: NONE SEEN WBC/hpf (ref 0–5)
pH: 6 (ref 5.0–8.0)

## 2016-09-15 NOTE — ED Notes (Addendum)
I stat chem 8 results:  Na 139 K 4.0 Cl 99 iCa 1.23 Glu 101 BUN 18 Crea 1.0 Hct 43% Hb 14.6  Dr. Christy Gentles aware of results.

## 2016-09-16 ENCOUNTER — Encounter: Payer: Self-pay | Admitting: Orthopedic Surgery

## 2016-09-16 ENCOUNTER — Ambulatory Visit (INDEPENDENT_AMBULATORY_CARE_PROVIDER_SITE_OTHER): Payer: Medicare Other | Admitting: Orthopedic Surgery

## 2016-09-16 VITALS — BP 126/78 | HR 92 | Wt 199.0 lb

## 2016-09-16 DIAGNOSIS — S52532A Colles' fracture of left radius, initial encounter for closed fracture: Secondary | ICD-10-CM | POA: Diagnosis not present

## 2016-09-16 MED ORDER — HYDROCODONE-ACETAMINOPHEN 5-325 MG PO TABS: 1.0000 | ORAL_TABLET | Freq: Four times a day (QID) | ORAL | 0 refills | Status: DC | PRN

## 2016-09-16 NOTE — Patient Instructions (Addendum)
OOW X 8 WEEK S   What You Need to Know About Prescription Opioid Pain Medicine Opioids are powerful medicines that are used to treat moderate to severe pain. Opioids should be taken with the supervision of a trained health care provider. They should be taken for the shortest period of time as possible. This is because opioids can be addictive and the longer you take opioids, the greater your risk of addiction (opioid use disorder). What do opioids do? Opioids help reduce or eliminate pain. When used for short periods of time, they can help you:  Sleep better.  Do better in physical or occupational therapy.  Feel better in the first few days after an injury.  Recover from surgery. What kind of problems can opioids cause? Opioids can cause side effects, such as:  Constipation.  Nausea.  Vomiting.  Drowsiness.  Confusion.  Opioid use disorder.  Breathing difficulties (respiratory depression). Using opioid pain medicines for longer than 3 days increases your risk of these side effects. Taking opioid pain medicine for a long period of time can affect your ability to do daily tasks. It also puts you at risk for:  Car accidents.  Heart attack.  Overdose, which can sometimes lead to death. What can increase my risk for developing problems while taking opioids? You may be at an especially high risk for problems while taking opioids if you:  Are over the age of 46.  Are pregnant.  Have kidney or liver disease.  Have certain mental health conditions, such as depression or anxiety.  Have a history of substance use disorder.  Have had an opioid overdose in the past. How do I stop taking opioids if I have been taking them for a long time? If you have been taking opioid medicine for more than a few weeks, you may need to slowly stop taking them (taper). Tapering your use of opioids can decrease your chances of experiencing withdrawal symptoms, such as:  Abdominal pain and  cramping.  Nausea.  Sweating.  Sleepiness.  Restlessness.  Uncontrollable shaking (tremors).  Cravings for the medicine. Do not attempt to taper your use of opioids on your own. Talk with your health care provider about how to do this. Your health care provider may prescribe a step-down schedule based on how much medicine you are taking and how long you have been taking it. What are the benefits of stopping the use of opioids? By switching from opioid pain medicine to non-opioid pain management options, you will decrease your risk of accidents and injuries associated with long-term opioid use. You will also be able to:  Monitor your pain more accurately and know when to seek medical care if it is not improving.  Decrease risk to others around you. Having opioids in the home increases the risk for accidental or intentional use or overdose by others. How can I treat pain without opioids? Pain can be managed with many types of alternative treatments. Ask your health care provider to refer you to one or more specialists who can help you manage pain through:  Physical or occupational therapy.  Counseling (cognitive-behavioral therapy).  Good nutrition.  Biofeedback.  Massage.  Meditation.  Non-opioid medicine.  Following a gentle exercise program. Where can I get support? If you have been taking opioids for a long time, you may benefit from receiving support for quitting from a local support group or counselor. Ask your health care provider for a referral to these resources in your area. When should I  seek medical care? Seek medical care right away if you are taking opioids and you experience any of the following:  Difficulty breathing.  Breathing that is more shallow or slower than normal.  A very slow heartbeat (pulse).  Severe confusion.  Unconsciousness.  Sleepiness.  Difficulty waking from sleep.  Slurred speech.  Nausea and vomiting.  Cold, clammy  skin.  Blue lips or fingernails.  Limpness.  Abnormally small pupils. If you think that you or someone else may have taken too much of an opioid medicine, get medical help right away. Do not wait to see if the symptoms go away on their own. Call your local emergency services (911 in the [place] or call the hotline of the [facility] 234-596-8856 in the [place].).  Where can I get more information? To learn more about opioid medicines, visit the Centers for Disease Control and Prevention web site Opioid Basics at https://keller-santana.com/. Summary  Opioid medicines can help you manage moderate-to-severe pain for a short period of time.  Taking opioid pain medicine for a long period of time puts you at risk for unintentional accidents, injury, and even death.  If you think that you or someone else may have taken too much of an opioid, get medical help right away. This information is not intended to replace advice given to you by your health care provider. Make sure you discuss any questions you have with your health care provider. Document Released: 09/18/2015 Document Revised: 04/15/2016 Document Reviewed: 04/03/2015 Elsevier Interactive Patient Education  2017 [employer].

## 2016-09-16 NOTE — Progress Notes (Signed)
Patient ID: KAZIYAH PIGNOTTI, female   DOB: 1951-07-20, 66 y.o.   MRN: [id number]  Chief Complaint  Patient presents with  . Wrist Injury    left wrist fracture, DOI 09/14/16    HPI ADHVIKA HERSEY is a 66 y.o. female.   Fell and fractured her left distal radius at home  No history of prior osteoporotic fractures, no history of bone density testing prior to injury  Pain located over the left wrist quality dull ache severity moderate duration 2 days associated symptoms swelling   Review of Systems Review of Systems  Respiratory: Negative for shortness of breath.   Cardiovascular: Negative for chest pain.  Neurological: Positive for dizziness.     Past Medical History:  Diagnosis Date  . Anxiety   . Hypertension   . Hypothyroidism   . Pelvic pressure in female 01/23/2014  . Positive fecal occult blood test 06/23/2015  . Rectocele 01/23/2014  . 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 Thyroid disease     Past Surgical History:  Procedure Laterality Date  . CESAREAN SECTION    . COLONOSCOPY N/A 09/11/2015   Procedure: COLONOSCOPY;  Surgeon: Rogene Houston, MD;  Location: [facility];  Service: Endoscopy;  Laterality: N/A;  moved to 1/6 @ 1:35 - Ann to notify pt  . LUNG SURGERY        Physical Exam 1 Blood pressure 126/78, pulse 92, weight 199 lb (90.3 kg). Physical Exam 2 The patient is well developed well nourished and well groomed. 3 Orientation to person place and time is normal  4 Mood is pleasant.  5 Ambulatory status Normal  6 Inspection of the left wrist reveals moderate to severe tenderness   mild to moderate swelling no deformity 7 Range of motion assessment: The range of motion is diminished primarily secondary to pain 8 Stability tests are deferred because of pain but the x-ray shows no subluxation of the joint 9 Strength assessment muscle tone is normal resistance testing is deferred because of pain and swelling  10 Nerve function normal sensation radial ulnar  median nerve 11 Vascular function normal pulses perfusion and capillary refill 12 Local lymphatic system normal epitrochlear lymph nodes  Opposite extremity right wrist there is no alignment abnormality, no contracture, no subluxation, no atrophy and neurovascular exam is intact  Data Reviewed X-rays I've independently interpreted the x-ray as follows  3 views of the left wrist: Comminuted fracture no displacement articular surface is at neutral  Assessment    Left distal radius Wrist fracture  Short arm cast x-ray out of plaster 4 weeks plan on re-casting at that time  Out of work  Meds ordered this encounter  Medications  . HYDROcodone-acetaminophen (NORCO/VICODIN) 5-325 MG tablet    Sig: Take 1 tablet by mouth every 6 (six) hours as needed for moderate pain.    Dispense:  30 tablet    Refill:  [place] controlled substance reporting system reviewed     Plan    SEE ABOVE

## 2016-09-19 ENCOUNTER — Ambulatory Visit: Payer: BC Managed Care – PPO | Admitting: Obstetrics and Gynecology

## 2016-09-21 ENCOUNTER — Ambulatory Visit: Payer: BC Managed Care – PPO | Admitting: Obstetrics and Gynecology

## 2016-09-23 ENCOUNTER — Other Ambulatory Visit (HOSPITAL_COMMUNITY): Payer: Self-pay | Admitting: Family Medicine

## 2016-09-23 DIAGNOSIS — E782 Mixed hyperlipidemia: Secondary | ICD-10-CM | POA: Diagnosis not present

## 2016-09-23 DIAGNOSIS — E039 Hypothyroidism, unspecified: Secondary | ICD-10-CM | POA: Diagnosis not present

## 2016-09-23 DIAGNOSIS — I10 Essential (primary) hypertension: Secondary | ICD-10-CM | POA: Diagnosis not present

## 2016-09-23 DIAGNOSIS — Z6834 Body mass index (BMI) 34.0-34.9, adult: Secondary | ICD-10-CM | POA: Diagnosis not present

## 2016-09-23 DIAGNOSIS — E2839 Other primary ovarian failure: Secondary | ICD-10-CM

## 2016-09-23 DIAGNOSIS — E6609 Other obesity due to excess calories: Secondary | ICD-10-CM | POA: Diagnosis not present

## 2016-09-23 DIAGNOSIS — Z1389 Encounter for screening for other disorder: Secondary | ICD-10-CM | POA: Diagnosis not present

## 2016-09-23 DIAGNOSIS — Z23 Encounter for immunization: Secondary | ICD-10-CM | POA: Diagnosis not present

## 2016-09-23 DIAGNOSIS — S5292XS Unspecified fracture of left forearm, sequela: Secondary | ICD-10-CM | POA: Diagnosis not present

## 2016-09-29 ENCOUNTER — Ambulatory Visit: Payer: Self-pay | Admitting: Obstetrics and Gynecology

## 2016-09-30 ENCOUNTER — Ambulatory Visit (INDEPENDENT_AMBULATORY_CARE_PROVIDER_SITE_OTHER): Payer: Medicare Other | Admitting: Orthopedic Surgery

## 2016-09-30 DIAGNOSIS — S52532D Colles' fracture of left radius, subsequent encounter for closed fracture with routine healing: Secondary | ICD-10-CM

## 2016-09-30 NOTE — Progress Notes (Signed)
Chief Complaint  Patient presents with  . Cast Check    CAST CHECK, LEFT WRIST FRACTURE, DOI 09/14/16    No tightness noted in the cast capillary refill good sensation normal patient will keep regular appointment

## 2016-10-14 ENCOUNTER — Ambulatory Visit (INDEPENDENT_AMBULATORY_CARE_PROVIDER_SITE_OTHER): Payer: Self-pay | Admitting: Orthopedic Surgery

## 2016-10-14 ENCOUNTER — Ambulatory Visit (INDEPENDENT_AMBULATORY_CARE_PROVIDER_SITE_OTHER): Payer: Medicare Other

## 2016-10-14 ENCOUNTER — Encounter: Payer: Self-pay | Admitting: Orthopedic Surgery

## 2016-10-14 DIAGNOSIS — S52532D Colles' fracture of left radius, subsequent encounter for closed fracture with routine healing: Secondary | ICD-10-CM | POA: Diagnosis not present

## 2016-10-14 NOTE — Progress Notes (Signed)
Follow up farcture care   Chief Complaint  Patient presents with  . Follow-up    4 week recheck on left wrist with xrays OOP, DOI 09-14-16.    She is doing well, she is requiring no pain medication having minimal discomfort  The wrist looks fine clinically and the x-rays show a stable comminuted fracture of the distal radius on the left side with ulnar styloid fracture  Patient will be placed in a fracture brace and follow-up in 4 weeks for x-ray  Encounter Diagnosis  Name Primary?  . Closed Colles' fracture of left radius with routine healing, subsequent encounter Yes

## 2016-10-20 DIAGNOSIS — S62102D Fracture of unspecified carpal bone, left wrist, subsequent encounter for fracture with routine healing: Secondary | ICD-10-CM | POA: Diagnosis not present

## 2016-10-20 DIAGNOSIS — Z6835 Body mass index (BMI) 35.0-35.9, adult: Secondary | ICD-10-CM | POA: Diagnosis not present

## 2016-10-20 DIAGNOSIS — M79674 Pain in right toe(s): Secondary | ICD-10-CM | POA: Diagnosis not present

## 2016-10-20 DIAGNOSIS — E669 Obesity, unspecified: Secondary | ICD-10-CM | POA: Diagnosis not present

## 2016-10-20 DIAGNOSIS — M79675 Pain in left toe(s): Secondary | ICD-10-CM | POA: Diagnosis not present

## 2016-10-20 DIAGNOSIS — B351 Tinea unguium: Secondary | ICD-10-CM | POA: Diagnosis not present

## 2016-10-20 DIAGNOSIS — I7300 Raynaud's syndrome without gangrene: Secondary | ICD-10-CM | POA: Diagnosis not present

## 2016-10-20 DIAGNOSIS — M2011 Hallux valgus (acquired), right foot: Secondary | ICD-10-CM | POA: Diagnosis not present

## 2016-10-20 DIAGNOSIS — M349 Systemic sclerosis, unspecified: Secondary | ICD-10-CM | POA: Diagnosis not present

## 2016-11-17 DIAGNOSIS — B351 Tinea unguium: Secondary | ICD-10-CM | POA: Diagnosis not present

## 2016-11-17 DIAGNOSIS — M79674 Pain in right toe(s): Secondary | ICD-10-CM | POA: Diagnosis not present

## 2016-11-17 DIAGNOSIS — M79675 Pain in left toe(s): Secondary | ICD-10-CM | POA: Diagnosis not present

## 2016-11-18 ENCOUNTER — Ambulatory Visit: Payer: Medicare Other | Admitting: Orthopedic Surgery

## 2016-11-22 ENCOUNTER — Encounter: Payer: Self-pay | Admitting: Orthopedic Surgery

## 2016-11-22 ENCOUNTER — Ambulatory Visit (INDEPENDENT_AMBULATORY_CARE_PROVIDER_SITE_OTHER): Payer: Self-pay | Admitting: Orthopedic Surgery

## 2016-11-22 ENCOUNTER — Ambulatory Visit (INDEPENDENT_AMBULATORY_CARE_PROVIDER_SITE_OTHER): Payer: Medicare Other

## 2016-11-22 DIAGNOSIS — S52532D Colles' fracture of left radius, subsequent encounter for closed fracture with routine healing: Secondary | ICD-10-CM

## 2016-11-22 NOTE — Patient Instructions (Signed)
START THERAPY, START WEANING BRACE 20 HOURS A DAY AND ONE LESS HOUR EACH DAY

## 2016-11-22 NOTE — Progress Notes (Signed)
FOLLOW UP VISIT   FRACTURE CARE   Patient ID: Regina Berry, female   DOB: 10/21/1950, 66 y.o.   MRN: [id number]  Chief Complaint  Patient presents with  . Follow-up    LEFT COLLES' FRACTURE, DOI 09/14/16    HPI Regina Berry is a 66 y.o. female.   HPI  Close treatment left distal radius fracture. Complains of ulnar-sided wrist pain and soreness over the fracture site. Currently in a removable brace  Review of Systems Review of Systems Normal thin function no neurologic symptoms  Physical Exam  There is tenderness over the ulnar styloid as well as the distal radius with no deformity. There is swelling which is related to the brace where as it is distal to the end of the [place] films show healing of the fracture with maintenance of fracture parameters including radial length and radial inclination and dorsal tilt  Encounter Diagnosis  Name Primary?  . Closed Colles' fracture of left radius with routine healing, subsequent encounter Yes    Secondary to stiffness of the fingers the patient will participate in occupational therapy at the hospital outpatient center  PLAN(RISK)    20 hour a day brace wear with one hour less brace wear per day to wean the brace use  Follow-up in 5 weeks after 4 weeks of therapy

## 2016-11-30 ENCOUNTER — Ambulatory Visit (HOSPITAL_COMMUNITY): Payer: Medicare Other | Attending: Orthopedic Surgery | Admitting: Specialist

## 2016-11-30 DIAGNOSIS — M25632 Stiffness of left wrist, not elsewhere classified: Secondary | ICD-10-CM | POA: Diagnosis not present

## 2016-11-30 DIAGNOSIS — R600 Localized edema: Secondary | ICD-10-CM | POA: Diagnosis not present

## 2016-11-30 DIAGNOSIS — M25532 Pain in left wrist: Secondary | ICD-10-CM | POA: Diagnosis not present

## 2016-11-30 DIAGNOSIS — R29898 Other symptoms and signs involving the musculoskeletal system: Secondary | ICD-10-CM | POA: Insufficient documentation

## 2016-11-30 DIAGNOSIS — M25642 Stiffness of left hand, not elsewhere classified: Secondary | ICD-10-CM

## 2016-11-30 NOTE — Therapy (Addendum)
[place], [place], [address] Phone: [phone]   Fax:  [phone]  Occupational Therapy Evaluation  Patient Details  Name: Regina Berry MRN: [id number] Date of Birth: 1950/10/11 Referring Provider: Dr. Arther Abbott  Encounter Date: 11/30/2016      OT End of Session - 11/30/16 2046    Visit Number 1   Number of Visits 16   Date for OT Re-Evaluation 01/29/17  mini reassess on 4/26   Authorization Type Medicare   Authorization Time Period before 10th visit   Authorization - Visit Number 1   Authorization - Number of Visits 10   OT Start Time 1030   OT Stop Time 1120   OT Time Calculation (min) 50 min   Activity Tolerance Patient tolerated treatment well   Behavior During Therapy [facility] for tasks assessed/performed      Past Medical History:  Diagnosis Date  . Anxiety   . Hypertension   . Hypothyroidism   . Pelvic pressure in female 01/23/2014  . Positive fecal occult blood test 06/23/2015  . Rectocele 01/23/2014  . 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 Thyroid disease     Past Surgical History:  Procedure Laterality Date  . CESAREAN SECTION    . COLONOSCOPY N/A 09/11/2015   Procedure: COLONOSCOPY;  Surgeon: Rogene Houston, MD;  [facility];  Service: Endoscopy;  Laterality: N/A;  moved to 1/6 @ 1:35 - Ann to notify pt  . LUNG SURGERY      There were no vitals filed for this visit.      Subjective Assessment - 11/30/16 2037    Subjective  S:  I want to be able to fix my hair again.   Pertinent History Mrs. Bachmeier states that on 1/11 she fell after getting out of bed. She landed on her left hand with her wrist extended, with her body weigth going through the left hand.  She went to the ED and was diagnosed with a right Colles fracture.  Dr. Aline Brochure casted her left arm for 4 weeks.  She was then placed in a brace, which she is currently weaning herself from.  She has been referred to  occupational therapy for evaluation and treatment.     Special Tests FOTO:  28/100   Patient Stated Goals I want to be able to use my left arm to fix my hair   Currently in Pain? Yes   Pain Score 4    Pain Location Wrist   Pain Orientation Left  ulnar border   Pain Descriptors / Indicators Aching   Pain Type Acute pain   Pain Radiating Towards ulnar border of wrist joint   Pain Onset More than a month ago   Pain Frequency Intermittent   Aggravating Factors  use or movement, stops when she stops that activity   Pain Relieving Factors rest   Effect of Pain on Daily Activities decreased use of left arm with functional activities            [facility] OT Assessment - 11/30/16 0001      Assessment   Diagnosis Left Colles Fracture   Referring Provider Dr. Arther Abbott   Onset Date 09/15/16     Precautions   Precautions None     Restrictions   Weight Bearing Restrictions No     Balance Screen   Has the patient fallen in the past 6 months Yes   How many times? 1  Has the patient had a decrease in activity level because of a fear of falling?  No   Is the patient reluctant to leave their home because of a fear of falling?  No     Home  Environment   Family/patient expects to be discharged to: Private residence   Lives With Spouse     Prior Function   Level of Independence Independent  driving   Vocation Part time employment   Vocation Requirements works in Estate manager/land agent at [employer], [place], cooks, lifts heavy pots and pans   Leisure shopping and spending time with grandchildren     ADL   ADL comments patient has increased difficulty with fastening her bra, fixing her hair, holding the hairdryer or hairbrush, lifting anything      Written Expression   Dominant Hand Right     Vision - History   Baseline Vision No visual deficits     Cognition   Overall Cognitive Status Within Functional Limits for tasks assessed     Observation/Other Assessments   Focus on Therapeutic  Outcomes (FOTO)  28/100     Sensation   Light Touch Appears Intact     Coordination   Gross Motor Movements are Fluid and Coordinated Yes   Fine Motor Movements are Fluid and Coordinated No   Coordination and Movement Description can not slide thumb to MCPJ of SF, only to DIPJ   9 Hole Peg Test Right;Left   Right 9 Hole Peg Test 19.42"   Left 9 Hole Peg Test 23/17"     Edema   Edema wrist crease: right:  16.8 cm left:  18.0 cm, MCPJ:  right:  20.5 cm  left:  20.0 cm     ROM / Strength   AROM / PROM / Strength AROM;PROM;Strength     Palpation   Palpation comment mod-max fascial restrictions in left forearm, wrist, hand     AROM   Overall AROM Comments assessed in seated   AROM Assessment Site Forearm;Wrist   Right/Left Forearm Left   Left Forearm Pronation 90 Degrees   Left Forearm Supination 43 Degrees   Right/Left Wrist Left   Left Wrist Extension 35 Degrees   Left Wrist Flexion 25 Degrees   Left Wrist Radial Deviation 10 Degrees   Left Wrist Ulnar Deviation 20 Degrees     PROM   PROM Assessment Site Forearm;Wrist   Right/Left Forearm Left   Left Forearm Pronation 90 Degrees   Left Forearm Supination 50 Degrees   Right/Left Wrist Left   Left Wrist Extension 45 Degrees   Left Wrist Flexion 40 Degrees   Left Wrist Radial Deviation 10 Degrees   Left Wrist Ulnar Deviation 20 Degrees     Strength   Strength Assessment Site Wrist;Forearm   Right/Left Forearm Left   Left Forearm Pronation 4/5   Left Forearm Supination 4/5   Right/Left Wrist Left   Left Wrist Flexion 4/5   Left Wrist Extension 4/5   Left Wrist Radial Deviation 4/5   Left Wrist Ulnar Deviation 4/5     Left Hand AROM   L Thumb MCP 0-60 40 Degrees   L Thumb IP 0-80 32 Degrees   L Index  MCP 0-90 54 Degrees   L Index PIP 0-100 48 Degrees   L Index DIP 0-70 22 Degrees   L Long  MCP 0-90 60 Degrees   L Long PIP 0-100 54 Degrees   L Long DIP 0-70 22 Degrees   L Ring  MCP  0-90 70 Degrees   L Ring  PIP 0-100 56 Degrees   L Ring DIP 0-70 32 Degrees   L Little  MCP 0-90 50 Degrees   L Little PIP 0-100 50 Degrees   L Little DIP 0-70 30 Degrees     Hand Function   Right Hand Grip (lbs) 60   Right Hand Lateral Pinch 13 lbs   Right Hand 3 Point Pinch 12 lbs   Left Hand Grip (lbs) 15   Left Hand Lateral Pinch 6 lbs   Left 3 point pinch 6 lbs                  OT Treatments/Exercises (OP) - 11/30/16 0001      Manual Therapy   Manual Therapy Myofascial release   Manual therapy comments manual therapy completed seperately from all other interventions   Myofascial Release myofascial release and manual stretching and edema reduction to left forearm, wrist, hand and associated areas to decrease pain and improve pain free moblity.               OT Education - 11/30/16 2119    Education provided Yes   Education Details left wrist A/ROM, tendon glides, fine motor coordination, digit abduction, edema management with contrast bath and edema glvoe    Person(s) Educated Patient   Methods Explanation;Demonstration;Handout   Comprehension Verbalized understanding          OT Short Term Goals - 11/30/16 2052      OT SHORT TERM GOAL #1   Title Patient will be educated on a HEP for left wrist and hand A/ROM for improved functional use of left arm.   Time 4   Period Weeks   Status New     OT SHORT TERM GOAL #2   Title Patient will improve left forearm, wrist, and hand P/ROM to [facility] for improved ability to wash dishes.   Time 4   Period Weeks   Status New     OT SHORT TERM GOAL #3   Title Patient will improve left forearm, wrist strength to 4/5 for improved ability to lift pots and pans.   Time 4   Period Weeks   Status New     OT SHORT TERM GOAL #4   Title Patient will improve left grip strength by 20 pounds and pinch strength by 3 pounds for improved ability to maintain grasp on her hairdryer.   Time 4   Period Weeks   Status New     OT SHORT TERM GOAL #5    Title Patient will decrease edema in her left wrist by .75 cm for improved functional use of left arm with daily tasks.   Time 4   Period Weeks   Status New     Additional Short Term Goals   Additional Short Term Goals Yes     OT SHORT TERM GOAL #6   Title Patient will improve left hand coordination by improving ability to complete nine hole peg test in 21 seconds or less.   Time 4   Period Weeks   Status New     OT SHORT TERM GOAL #7   Title Patient will decrease left hand and wrist pain to 3/10 when completing functional activities.   Time 4   Period Weeks   Status New           OT Long Term Goals - 11/30/16 2058      OT LONG TERM GOAL #1  Title Patient will return to prior level of independence with all B/IADLs, work, and leisure activities using left arm and hand actively.   Time 8   Period Weeks   Status New     OT LONG TERM GOAL #2   Title Patient will improve left forearm, wrist, and hand A/ROM to WNL for improved ability to fasten bra and fix her hair.    Time 8   Period Weeks   Status New     OT LONG TERM GOAL #3   Title Patient will improve left forearm and wrist strength to 5/5 in order to lift pots and pans at work and push up from a seated position.   Time 8   Period Weeks   Status New     OT LONG TERM GOAL #4   Title Patient will improve left grip strength by 35 pounds and pinch strength by 6 pounds in order to maintain grasp on pots and pans at work.     Time 8   Period Weeks   Status New     OT LONG TERM GOAL #5   Title Patient will demonstrate normal fine motor coordination needed for fastening her bra by completing nine hole peg test in 20 seconds or less.    Time 8   Period Weeks   Status New     Long Term Additional Goals   Additional Long Term Goals Yes     OT LONG TERM GOAL #6   Title Patient will decrease edema in left wrist by 1.0 cm or greater for improved flexibility needed for ADL completion.   Time 8   Period Weeks   Status  New     OT LONG TERM GOAL #7   Title Patient will decrase pain in her left wrist to 1/10 or better during ADL completion.   Time 8   Status New     OT LONG TERM GOAL #8   Title Patient will be able to oppose to her small finger DIPJ for improved dexterity needed for ADL completion.    Time 8   Period Weeks   Status New               Plan - 11/30/16 2047    Clinical Impression Statement A: Mrs. Clingenpeel is a 65 year old female that fell after getting out of bed on 09/15/16.  She landed on her left hand, with her wrist extended.  She was diagnosed with a left Colles fracture, placed in a cast for 4 weeks and a brace after, which she is weaning from.  She is unable to use her left arm as she normally would with her B/ADLs, fixing her hair is difficult.  She is unable to hold her blowdryer.  She works part time in [employer] at [employer], and she is not able to work due to her wrist fracture, she is also not able to shop or hold her small grandchildren due to her wrist fracture.   Rehab Potential Good   OT Frequency 2x / week   OT Duration 8 weeks   OT Treatment/Interventions Self-care/ADL training;Cryotherapy;Ultrasound;Moist Heat;Electrical Stimulation;Parrafin;Contrast Bath;DME and/or AE instruction;Energy conservation;Neuromuscular education;Therapeutic exercise;Manual Therapy;Passive range of motion;Therapeutic activities;Therapeutic exercises;Splinting;Patient/family education     P:  SKilled OT intervention to improve pain free mobility and strength in left wrist and hand.  Next session:  Begin active and passive range of motion exercises, follow up on HEP.  Patient will benefit from skilled therapeutic intervention  in order to improve the following deficits and impairments:  Decreased coordination, Decreased range of motion, Decreased strength, Increased edema, Increased fascial restricitons, Increased muscle spasms, Impaired UE functional use, Pain  Visit  Diagnosis: Pain in left wrist - Plan: Ot plan of care cert/re-cert  Other symptoms and signs involving the musculoskeletal system - Plan: Ot plan of care cert/re-cert  Localized edema - Plan: Ot plan of care cert/re-cert  Stiffness of left wrist, not elsewhere classified - Plan: Ot plan of care cert/re-cert  Stiffness of left hand, not elsewhere classified - Plan: Ot plan of care cert/re-cert      G-Codes - 62/70/35 December 31, 2104    Functional Assessment Tool Used (Outpatient only) 28/100, 72% impaired   Functional Limitation Carrying, moving and handling objects   Carrying, Moving and Handling Objects Current Status [phone]) At least 60 percent but less than 80 percent impaired, limited or restricted   Carrying, Moving and Handling Objects Goal Status (H8299) At least 20 percent but less than 40 percent impaired, limited or restricted      Problem List Patient Active Problem List   Diagnosis Date Noted  . Depression 08/18/2015  . Positive fecal occult blood test 06/23/2015  . Pelvic pressure in female 01/23/2014  . Rectocele 01/23/2014    Vangie Bicker, [place], OTR/L [phone]  11/30/2016, 9:23 PM  [place] [address] [place], [place], [address] Phone: [phone]   Fax:  [phone]  Name: GRAYLEE ARUTYUNYAN MRN: [id number] Date of Birth: 1951-07-01

## 2016-11-30 NOTE — Patient Instructions (Signed)
Edema Reduction (Contrast Baths)    Have 2 containers deep enough for involved area to be immersed. Fill one with warm water and the other with slightly chilled water. Soak in warm water for 1 to 2 minutes; cold for  to 1 minute. Alternate and continue for 10 minutes. End in warm water.  Copyright  VHI. All rights reserved.   AROM: Wrist Flexion / Extension  Actively bend right wrist forward then back as far as possible. Repeat ____ times per set. Do ____ sets per session. Do ____ sessions per day.  Copyright  VHI. All rights reserved.  AROM: Wrist Radial / Ulnar Deviation   Gently bend left wrist from side to side as far as possible. Repeat ____ times per set. Do ____ sets per session. Do ____ sessions per day.  Copyright  VHI. All rights reserved.  AROM: Forearm Pronation / Supination   With right arm in handshake position, slowly rotate palm down until stretch is felt. Relax. Then rotate palm up until stretch is felt. Repeat ____ times per set. Do ____ sets per session. Do ____ sessions per day.  Copyright  VHI. All rights reserved.        Paper Crumpling Exercise   Begin with right palm down on piece of paper. Maintaining contact between surface and heel of hand, crumple paper into a ball. Repeat ____ times per set. Do ____ sets per session. Do ____ sessions per day.  Copyright  VHI. All rights reserved.     Towel Roll Squeeze   With right forearm resting on surface, gently squeeze towel. Repeat ____ times per set. Do ____ sets per session. Do ____ sessions per day.  Copyright  VHI. All rights reserved.   Abduction / Adduction (Active)    With hand flat on table, spread all fingers apart, then bring them together as close as possible. Repeat ____ times. Do ____ sessions per day.  Copyright  VHI. All rights reserved.    Touch thumb to each finger, when you get to your pinky, slide your thumb down your pinky until you reach the bottom knuckle.

## 2016-12-06 ENCOUNTER — Ambulatory Visit (HOSPITAL_COMMUNITY): Payer: Medicare Other | Attending: Orthopedic Surgery | Admitting: Specialist

## 2016-12-06 DIAGNOSIS — R600 Localized edema: Secondary | ICD-10-CM

## 2016-12-06 DIAGNOSIS — M25642 Stiffness of left hand, not elsewhere classified: Secondary | ICD-10-CM | POA: Diagnosis not present

## 2016-12-06 DIAGNOSIS — M25632 Stiffness of left wrist, not elsewhere classified: Secondary | ICD-10-CM | POA: Diagnosis not present

## 2016-12-06 DIAGNOSIS — R29898 Other symptoms and signs involving the musculoskeletal system: Secondary | ICD-10-CM | POA: Diagnosis not present

## 2016-12-06 DIAGNOSIS — M25532 Pain in left wrist: Secondary | ICD-10-CM

## 2016-12-06 NOTE — Therapy (Signed)
[place], [place], [address] Phone: [phone]   Fax:  [phone]  Occupational Therapy Treatment  Patient Details  Name: Regina Berry MRN: [id number] Date of Birth: 06-16-1951 Referring Provider: Dr. Arther Abbott  Encounter Date: 12/06/2016      OT End of Session - 12/06/16 1020    Visit Number 2   Number of Visits 16   Date for OT Re-Evaluation 01/29/17  mini reassess on 12/29/16   Authorization Type Medicare   Authorization Time Period before 10th visit   Authorization - Visit Number 2   Authorization - Number of Visits 10   OT Start Time 0950   OT Stop Time 1030   OT Time Calculation (min) 40 min   Activity Tolerance Patient tolerated treatment well   Behavior During Therapy [facility] for tasks assessed/performed      Past Medical History:  Diagnosis Date  . Anxiety   . Hypertension   . Hypothyroidism   . Pelvic pressure in female 01/23/2014  . Positive fecal occult blood test 06/23/2015  . Rectocele 01/23/2014  . 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 Thyroid disease     Past Surgical History:  Procedure Laterality Date  . CESAREAN SECTION    . COLONOSCOPY N/A 09/11/2015   Procedure: COLONOSCOPY;  Surgeon: Rogene Houston, MD;  [facility];  Service: Endoscopy;  Laterality: N/A;  moved to 1/6 @ 1:35 - Ann to notify pt  . LUNG SURGERY      There were no vitals filed for this visit.      Subjective Assessment - 12/06/16 1014    Subjective  S:  The glove is really helping my swelling.    Currently in Pain? Yes   Pain Score 3    Pain Location Wrist   Pain Orientation Left   Pain Descriptors / Indicators Aching   Pain Type Acute pain            [facility] OT Assessment - 12/06/16 0001      Assessment   Diagnosis Left Colles Fracture     Precautions   Precautions None     Restrictions   Weight Bearing Restrictions No                  OT Treatments/Exercises (OP) -  12/06/16 0001      Exercises   Exercises Wrist;Hand     Weighted Stretch Over Towel Roll   Supination - Weighted Stretch 1 pound;60 seconds   Wrist Flexion - Weighted Stretch 1 pound;60 seconds   Wrist Extension - Weighted Stretch 1 pound;60 seconds   Radial Deviation - Weighted Stretch 1 pound;60 seconds     Wrist Exercises   Forearm Supination PROM;AROM;10 reps   Forearm Pronation PROM;AROM;10 reps   Wrist Flexion PROM;AROM;10 reps   Wrist Extension PROM;AROM;10 reps   Wrist Radial Deviation PROM;AROM;10 reps   Wrist Ulnar Deviation PROM;AROM;10 reps     Fine Motor Coordination   Tendon Glides 5 times A/ROM     Additional Wrist Exercises   Sponges 9, 12, 12   Theraputty Flatten;Roll;Grip   Theraputty - Flatten yellow in standing, then used pvc pipe for wrist stability exercise   Theraputty - Roll yellow   Theraputty - Grip in supinated and pronated position     Hand Exercises   Thumb Opposition to small finger PIPJ     Manual Therapy   Manual Therapy Myofascial release   Manual therapy  comments manual therapy completed seperately from all other interventions   Myofascial Release myofascial release and manual stretching and edema reduction to left forearm, wrist, hand and associated areas to decrease pain and improve pain free moblity.                OT Education - 12/06/16 1019    Education provided Yes   Education Details reviewed plan of care, goals and issued patient at written copy.   Person(s) Educated Patient   Methods Explanation;Handout   Comprehension Verbalized understanding          OT Short Term Goals - 12/06/16 1022      OT SHORT TERM GOAL #1   Title Patient will be educated on a HEP for left wrist and hand A/ROM for improved functional use of left arm.   Time 4   Period Weeks   Status On-going     OT SHORT TERM GOAL #2   Title Patient will improve left forearm, wrist, and hand P/ROM to [facility] for improved ability to wash dishes.   Time  4   Period Weeks   Status On-going     OT SHORT TERM GOAL #3   Title Patient will improve left forearm, wrist strength to 4/5 for improved ability to lift pots and pans.   Time 4   Period Weeks   Status On-going     OT SHORT TERM GOAL #4   Title Patient will improve left grip strength by 20 pounds and pinch strength by 3 pounds for improved ability to maintain grasp on her hairdryer.   Time 4   Period Weeks   Status On-going     OT SHORT TERM GOAL #5   Title Patient will decrease edema in her left wrist by .75 cm for improved functional use of left arm with daily tasks.   Time 4   Period Weeks   Status On-going     OT SHORT TERM GOAL #6   Title Patient will improve left hand coordination by improving ability to complete nine hole peg test in 21 seconds or less.   Time 4   Period Weeks   Status On-going     OT SHORT TERM GOAL #7   Title Patient will decrease left hand and wrist pain to 3/10 when completing functional activities.   Time 4   Period Weeks   Status On-going           OT Long Term Goals - 12/06/16 1023      OT LONG TERM GOAL #1   Title Patient will return to prior level of independence with all B/IADLs, work, and leisure activities using left arm and hand actively.   Time 8   Period Weeks   Status On-going     OT LONG TERM GOAL #2   Title Patient will improve left forearm, wrist, and hand A/ROM to WNL for improved ability to fasten bra and fix her hair.    Time 8   Period Weeks   Status On-going     OT LONG TERM GOAL #3   Title Patient will improve left forearm and wrist strength to 5/5 in order to lift pots and pans at work and push up from a seated position.   Time 8   Period Weeks   Status On-going     OT LONG TERM GOAL #4   Title Patient will improve left grip strength by 35 pounds and pinch strength by 6 pounds in order to  maintain grasp on pots and pans at work.     Time 8   Period Weeks   Status On-going     OT LONG TERM GOAL #5    Title Patient will demonstrate normal fine motor coordination needed for fastening her bra by completing nine hole peg test in 20 seconds or less.    Time 8   Period Weeks   Status On-going     OT LONG TERM GOAL #6   Title Patient will decrease edema in left wrist by 1.0 cm or greater for improved flexibility needed for ADL completion.   Time 8   Period Weeks   Status On-going     OT LONG TERM GOAL #7   Title Patient will decrase pain in her left wrist to 1/10 or better during ADL completion.   Time 8   Status On-going     OT LONG TERM GOAL #8   Title Patient will be able to oppose to her small finger DIPJ for improved dexterity needed for ADL completion.    Time 8   Period Weeks   Status On-going               Plan - 12/06/16 1020    Clinical Impression Statement A:  Patient has noted decrease in edema and improvement in mobility in wrist and hand this date.  began therapeutic exercises to improve mobility and strength needed to return to prior level of independence wtih daily tasks.     Plan P:  Continue to improve mobility in left wrist and hand.  Increase to 2# with wrist and forearm weighted stretch.        Patient will benefit from skilled therapeutic intervention in order to improve the following deficits and impairments:  Decreased coordination, Decreased range of motion, Decreased strength, Increased edema, Increased fascial restricitons, Increased muscle spasms, Impaired UE functional use, Pain  Visit Diagnosis: Pain in left wrist  Other symptoms and signs involving the musculoskeletal system  Localized edema  Stiffness of left wrist, not elsewhere classified  Stiffness of left hand, not elsewhere classified    Problem List Patient Active Problem List   Diagnosis Date Noted  . Depression 08/18/2015  . Positive fecal occult blood test 06/23/2015  . Pelvic pressure in female 01/23/2014  . Rectocele 01/23/2014    Vangie Bicker, [place],  OTR/L [phone]  12/06/2016, 10:30 AM  [place] [address] [place], [place], [address] Phone: [phone]   Fax:  [phone]  Name: JOHNNAY PLEITEZ MRN: [id number] Date of Birth: 1950/11/17

## 2016-12-09 ENCOUNTER — Ambulatory Visit (HOSPITAL_COMMUNITY): Payer: Medicare Other

## 2016-12-09 ENCOUNTER — Encounter (HOSPITAL_COMMUNITY): Payer: Self-pay

## 2016-12-09 DIAGNOSIS — M25532 Pain in left wrist: Secondary | ICD-10-CM

## 2016-12-09 DIAGNOSIS — M25632 Stiffness of left wrist, not elsewhere classified: Secondary | ICD-10-CM

## 2016-12-09 DIAGNOSIS — R600 Localized edema: Secondary | ICD-10-CM

## 2016-12-09 DIAGNOSIS — M25642 Stiffness of left hand, not elsewhere classified: Secondary | ICD-10-CM

## 2016-12-09 DIAGNOSIS — R29898 Other symptoms and signs involving the musculoskeletal system: Secondary | ICD-10-CM

## 2016-12-09 NOTE — Therapy (Signed)
[place], [place], [address] Phone: [phone]   Fax:  [phone]  Occupational Therapy Treatment  Patient Details  Name: KELE WITHEM MRN: [id number] Date of Birth: [birth year] Referring Provider: Dr. Arther Abbott  Encounter Date: 12/09/2016      OT End of Session - 12/09/16 1056    Visit Number 3   Number of Visits 16   Date for OT Re-Evaluation 01/29/17  mini reassess on 12/29/16   Authorization Type Medicare   Authorization Time Period before 10th visit   Authorization - Visit Number 3   Authorization - Number of Visits 10   OT Start Time 0950   OT Stop Time 1030   OT Time Calculation (min) 40 min   Activity Tolerance Patient tolerated treatment well   Behavior During Therapy [facility] for tasks assessed/performed      Past Medical History:  Diagnosis Date  . Anxiety   . Hypertension   . Hypothyroidism   . Pelvic pressure in female 01/23/2014  . Positive fecal occult blood test 06/23/2015  . Rectocele 01/23/2014  . 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 Thyroid disease     Past Surgical History:  Procedure Laterality Date  . CESAREAN SECTION    . COLONOSCOPY N/A 09/11/2015   Procedure: COLONOSCOPY;  Surgeon: Rogene Houston, MD;  [facility];  Service: Endoscopy;  Laterality: N/A;  moved to 1/6 @ 1:35 - Ann to notify pt  . LUNG SURGERY      There were no vitals filed for this visit.      Subjective Assessment - 12/09/16 1008    Subjective  S: I still have some swelling.   Currently in Pain? No/denies            [facility] OT Assessment - 12/09/16 1008      Assessment   Diagnosis Left Colles Fracture     Precautions   Precautions None                  OT Treatments/Exercises (OP) - 12/09/16 1008      Exercises   Exercises Wrist;Hand     Weighted Stretch Over Towel Roll   Supination - Weighted Stretch 2 pounds;60 seconds   Wrist Flexion - Weighted Stretch 2  pounds;60 seconds   Wrist Extension - Weighted Stretch 2 pounds;60 seconds   Radial Deviation - Weighted Stretch 2 pounds;60 seconds     Wrist Exercises   Forearm Supination PROM;5 reps;Strengthening  12X   Bar Weights/Barbell (Forearm Supination) 2 lbs   Forearm Pronation PROM;5 reps;Strengthening  12X   Bar Weights/Barbell (Forearm Pronation) 2 lbs   Wrist Flexion PROM;5 reps;Strengthening  12X   Bar Weights/Barbell (Wrist Flexion) 2 lbs   Wrist Extension PROM;5 reps;Strengthening  12X   Bar Weights/Barbell (Wrist Extension) 2 lbs   Wrist Radial Deviation PROM;5 reps;Strengthening  12X   Bar Weights/Barbell (Radial Deviation) 2 lbs   Wrist Ulnar Deviation PROM;5 reps;Strengthening  12X   Bar Weights/Barbell (Ulnar Deviation) 2 lbs     Additional Wrist Exercises   Sponges 15,16   Hand Gripper with Large Beads all beads with gripper set at 11#   Hand Gripper with Medium Beads all beads with gripper set at 11#   Hand Gripper with Small Beads all beads with beads set at 113     Manual Therapy   Manual Therapy Myofascial release   Manual therapy comments manual therapy completed seperately from all other  interventions   Myofascial Release myofascial release and manual stretching and edema reduction to left forearm, wrist, hand and associated areas to decrease pain and improve pain free moblity.                OT Education - 12/09/16 1055    Education provided Yes   Education Details Wrist strengthening: wrist supination/pronation and radial deviation   Person(s) Educated Patient   Methods Explanation;Handout;Demonstration;Verbal cues   Comprehension Verbalized understanding;Returned demonstration          OT Short Term Goals - 12/06/16 1022      OT SHORT TERM GOAL #1   Title Patient will be educated on a HEP for left wrist and hand A/ROM for improved functional use of left arm.   Time 4   Period Weeks   Status On-going     OT SHORT TERM GOAL #2   Title  Patient will improve left forearm, wrist, and hand P/ROM to [facility] for improved ability to wash dishes.   Time 4   Period Weeks   Status On-going     OT SHORT TERM GOAL #3   Title Patient will improve left forearm, wrist strength to 4/5 for improved ability to lift pots and pans.   Time 4   Period Weeks   Status On-going     OT SHORT TERM GOAL #4   Title Patient will improve left grip strength by 20 pounds and pinch strength by 3 pounds for improved ability to maintain grasp on her hairdryer.   Time 4   Period Weeks   Status On-going     OT SHORT TERM GOAL #5   Title Patient will decrease edema in her left wrist by .75 cm for improved functional use of left arm with daily tasks.   Time 4   Period Weeks   Status On-going     OT SHORT TERM GOAL #6   Title Patient will improve left hand coordination by improving ability to complete nine hole peg test in 21 seconds or less.   Time 4   Period Weeks   Status On-going     OT SHORT TERM GOAL #7   Title Patient will decrease left hand and wrist pain to 3/10 when completing functional activities.   Time 4   Period Weeks   Status On-going           OT Long Term Goals - 12/06/16 1023      OT LONG TERM GOAL #1   Title Patient will return to prior level of independence with all B/IADLs, work, and leisure activities using left arm and hand actively.   Time 8   Period Weeks   Status On-going     OT LONG TERM GOAL #2   Title Patient will improve left forearm, wrist, and hand A/ROM to WNL for improved ability to fasten bra and fix her hair.    Time 8   Period Weeks   Status On-going     OT LONG TERM GOAL #3   Title Patient will improve left forearm and wrist strength to 5/5 in order to lift pots and pans at work and push up from a seated position.   Time 8   Period Weeks   Status On-going     OT LONG TERM GOAL #4   Title Patient will improve left grip strength by 35 pounds and pinch strength by 6 pounds in order to maintain  grasp on pots and pans at work.  Time 8   Period Weeks   Status On-going     OT LONG TERM GOAL #5   Title Patient will demonstrate normal fine motor coordination needed for fastening her bra by completing nine hole peg test in 20 seconds or less.    Time 8   Period Weeks   Status On-going     OT LONG TERM GOAL #6   Title Patient will decrease edema in left wrist by 1.0 cm or greater for improved flexibility needed for ADL completion.   Time 8   Period Weeks   Status On-going     OT LONG TERM GOAL #7   Title Patient will decrase pain in her left wrist to 1/10 or better during ADL completion.   Time 8   Status On-going     OT LONG TERM GOAL #8   Title Patient will be able to oppose to her small finger DIPJ for improved dexterity needed for ADL completion.    Time 8   Period Weeks   Status On-going               Plan - 12/09/16 1056    Clinical Impression Statement A: patient reports concern that her wrist wants to stay in slight radial deviation. patient was given 2 strengthening exercises to add to HEP.   Plan P: Follow up on HEP. Continue with wrist strengthening. Increase to 3# if able to tolerate.       Patient will benefit from skilled therapeutic intervention in order to improve the following deficits and impairments:  Decreased coordination, Decreased range of motion, Decreased strength, Increased edema, Increased fascial restricitons, Increased muscle spasms, Impaired UE functional use, Pain  Visit Diagnosis: Pain in left wrist  Other symptoms and signs involving the musculoskeletal system  Localized edema  Stiffness of left wrist, not elsewhere classified  Stiffness of left hand, not elsewhere classified    Problem List Patient Active Problem List   Diagnosis Date Noted  . Depression 08/18/2015  . Positive fecal occult blood test 06/23/2015  . Pelvic pressure in female 01/23/2014  . Rectocele 01/23/2014   Ailene Ravel, OTR/L,CBIS   [phone]  12/09/2016, 11:02 AM  [place] [address] [place], [place], [address] Phone: [phone]   Fax:  [phone]  Name: NIKETA TURNER MRN: [id number] Date of Birth: 01/06/1951

## 2016-12-09 NOTE — Patient Instructions (Signed)
FREE WEIGHT RADIAL DEVIATION  Start by holding the end of a small free weight with your hand by the side of your body. The free weight should be pointed forward as shown.   Next, bend your wrist so that the end of the weight raises upward. Return to the starting position and repeat.  Repeat 12 times. Complete 1 set.   Wrist Supination/Pronation  Wrist Supination/Pronation  Start: Sit with your arm supported at 90 degrees with dumbbell in hand as pictured.  Movement: (Pronation) Control the movement of turning your wrist so that your palm is faced down  (Supination) Control the movement of turning your wrist so that your palm is faced up  *Note-Control the movement, do not let the weight speed up the movement. Complete 12 repetitions. 1 set

## 2016-12-12 ENCOUNTER — Ambulatory Visit (HOSPITAL_COMMUNITY): Payer: Medicare Other

## 2016-12-12 ENCOUNTER — Encounter (HOSPITAL_COMMUNITY): Payer: Self-pay

## 2016-12-12 DIAGNOSIS — M25532 Pain in left wrist: Secondary | ICD-10-CM

## 2016-12-12 DIAGNOSIS — M25632 Stiffness of left wrist, not elsewhere classified: Secondary | ICD-10-CM | POA: Diagnosis not present

## 2016-12-12 DIAGNOSIS — R29898 Other symptoms and signs involving the musculoskeletal system: Secondary | ICD-10-CM

## 2016-12-12 DIAGNOSIS — M25642 Stiffness of left hand, not elsewhere classified: Secondary | ICD-10-CM | POA: Diagnosis not present

## 2016-12-12 DIAGNOSIS — R600 Localized edema: Secondary | ICD-10-CM | POA: Diagnosis not present

## 2016-12-12 NOTE — Therapy (Signed)
[place], [place], [address] Phone: [phone]   Fax:  [phone]  Occupational Therapy Treatment  Patient Details  Name: Regina Berry MRN: [id number] Date of Birth: 1950-11-18 Referring Provider: Dr. Arther Abbott  Encounter Date: 12/12/2016      OT End of Session - 12/12/16 1253    Visit Number 4   Number of Visits 16   Date for OT Re-Evaluation 01/29/17  mini reassess on 12/29/16   Authorization Type Medicare   Authorization Time Period before 10th visit   Authorization - Visit Number 4   Authorization - Number of Visits 10   OT Start Time 4315   OT Stop Time 1115   OT Time Calculation (min) 40 min   Activity Tolerance Patient tolerated treatment well   Behavior During Therapy [facility] for tasks assessed/performed      Past Medical History:  Diagnosis Date  . Anxiety   . Hypertension   . Hypothyroidism   . Pelvic pressure in female 01/23/2014  . Positive fecal occult blood test 06/23/2015  . Rectocele 01/23/2014  . 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 Thyroid disease     Past Surgical History:  Procedure Laterality Date  . CESAREAN SECTION    . COLONOSCOPY N/A 09/11/2015   Procedure: COLONOSCOPY;  Surgeon: Rogene Houston, MD;  [facility];  Service: Endoscopy;  Laterality: N/A;  moved to 1/6 @ 1:35 - Ann to notify pt  . LUNG SURGERY      There were no vitals filed for this visit.      Subjective Assessment - 12/12/16 1058    Subjective  S: I was able to hook my bra this morning.    Currently in Pain? No/denies            [facility] OT Assessment - 12/12/16 1057      Assessment   Diagnosis Left Colles Fracture     Precautions   Precautions None                  OT Treatments/Exercises (OP) - 12/12/16 1057      Exercises   Exercises Wrist;Hand     Weighted Stretch Over Towel Roll   Supination - Weighted Stretch 3 pounds;60 seconds   Wrist Flexion - Weighted  Stretch 3 pounds;60 seconds   Wrist Extension - Weighted Stretch 3 pounds;60 seconds   Radial Deviation - Weighted Stretch 3 pounds;60 seconds     Wrist Exercises   Forearm Supination PROM;5 reps;Strengthening;15 reps   Bar Weights/Barbell (Forearm Supination) 3 lbs   Forearm Pronation PROM;5 reps;Strengthening;15 reps   Bar Weights/Barbell (Forearm Pronation) 3 lbs   Wrist Flexion PROM;5 reps;Strengthening;15 reps   Bar Weights/Barbell (Wrist Flexion) 3 lbs   Wrist Extension PROM;5 reps;Strengthening;15 reps   Bar Weights/Barbell (Wrist Extension) 3 lbs     Additional Wrist Exercises   Sponges 13,16     Fine Motor Coordination   Fine Motor Coordination Grooved pegs   Grooved pegs Patient was able to pick up 5 pegs at a time before placing them in the pegboard focusing on palm to fingertip transition. Pt was then able to pick up pegs and hold them all in the palm of her hand while only dropping 2.      Manual Therapy   Manual Therapy Myofascial release   Manual therapy comments manual therapy completed seperately from all other interventions   Myofascial Release myofascial release and manual stretching and  edema reduction to left forearm, wrist, hand and associated areas to decrease pain and improve pain free moblity.                  OT Short Term Goals - 12/06/16 1022      OT SHORT TERM GOAL #1   Title Patient will be educated on a HEP for left wrist and hand A/ROM for improved functional use of left arm.   Time 4   Period Weeks   Status On-going     OT SHORT TERM GOAL #2   Title Patient will improve left forearm, wrist, and hand P/ROM to [facility] for improved ability to wash dishes.   Time 4   Period Weeks   Status On-going     OT SHORT TERM GOAL #3   Title Patient will improve left forearm, wrist strength to 4/5 for improved ability to lift pots and pans.   Time 4   Period Weeks   Status On-going     OT SHORT TERM GOAL #4   Title Patient will improve left  grip strength by 20 pounds and pinch strength by 3 pounds for improved ability to maintain grasp on her hairdryer.   Time 4   Period Weeks   Status On-going     OT SHORT TERM GOAL #5   Title Patient will decrease edema in her left wrist by .75 cm for improved functional use of left arm with daily tasks.   Time 4   Period Weeks   Status On-going     OT SHORT TERM GOAL #6   Title Patient will improve left hand coordination by improving ability to complete nine hole peg test in 21 seconds or less.   Time 4   Period Weeks   Status On-going     OT SHORT TERM GOAL #7   Title Patient will decrease left hand and wrist pain to 3/10 when completing functional activities.   Time 4   Period Weeks   Status On-going           OT Long Term Goals - 12/06/16 1023      OT LONG TERM GOAL #1   Title Patient will return to prior level of independence with all B/IADLs, work, and leisure activities using left arm and hand actively.   Time 8   Period Weeks   Status On-going     OT LONG TERM GOAL #2   Title Patient will improve left forearm, wrist, and hand A/ROM to WNL for improved ability to fasten bra and fix her hair.    Time 8   Period Weeks   Status On-going     OT LONG TERM GOAL #3   Title Patient will improve left forearm and wrist strength to 5/5 in order to lift pots and pans at work and push up from a seated position.   Time 8   Period Weeks   Status On-going     OT LONG TERM GOAL #4   Title Patient will improve left grip strength by 35 pounds and pinch strength by 6 pounds in order to maintain grasp on pots and pans at work.     Time 8   Period Weeks   Status On-going     OT LONG TERM GOAL #5   Title Patient will demonstrate normal fine motor coordination needed for fastening her bra by completing nine hole peg test in 20 seconds or less.    Time 8   Period Weeks  Status On-going     OT LONG TERM GOAL #6   Title Patient will decrease edema in left wrist by 1.0 cm or  greater for improved flexibility needed for ADL completion.   Time 8   Period Weeks   Status On-going     OT LONG TERM GOAL #7   Title Patient will decrase pain in her left wrist to 1/10 or better during ADL completion.   Time 8   Status On-going     OT LONG TERM GOAL #8   Title Patient will be able to oppose to her small finger DIPJ for improved dexterity needed for ADL completion.    Time 8   Period Weeks   Status On-going               Plan - 12/12/16 1254    Clinical Impression Statement A: Patient continues to wear edema glove. Edema still noted more dominantly in the left 2nd and 3rd digit and in 2-5 MCP joints. Tightness noted in MCP joints of 2nd and 3rd joints. VC for form and technique.    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 Complete tweezer task and continue with gripper activity.       Patient will benefit from skilled therapeutic intervention in order to improve the following deficits and impairments:  Decreased coordination, Decreased range of motion, Decreased strength, Increased edema, Increased fascial restricitons, Increased muscle spasms, Impaired UE functional use, Pain  Visit Diagnosis: Pain in left wrist  Other symptoms and signs involving the musculoskeletal system  Localized edema    Problem List Patient Active Problem List   Diagnosis Date Noted  . Depression 08/18/2015  . Positive fecal occult blood test 06/23/2015  . Pelvic pressure in female 01/23/2014  . Rectocele 01/23/2014   Ailene Ravel, OTR/L,CBIS  [phone]  12/12/2016, 12:56 PM  [place] [address] [place], [place], [address] Phone: [phone]   Fax:  [phone]  Name: ALLEYAH TWOMBLY MRN: [id number] Date of Birth: 05/20/1951

## 2016-12-13 ENCOUNTER — Ambulatory Visit (HOSPITAL_COMMUNITY): Payer: Medicare Other | Admitting: Occupational Therapy

## 2016-12-13 ENCOUNTER — Encounter (HOSPITAL_COMMUNITY): Payer: Self-pay | Admitting: Occupational Therapy

## 2016-12-13 DIAGNOSIS — R600 Localized edema: Secondary | ICD-10-CM

## 2016-12-13 DIAGNOSIS — M25642 Stiffness of left hand, not elsewhere classified: Secondary | ICD-10-CM

## 2016-12-13 DIAGNOSIS — R29898 Other symptoms and signs involving the musculoskeletal system: Secondary | ICD-10-CM

## 2016-12-13 DIAGNOSIS — M25532 Pain in left wrist: Secondary | ICD-10-CM

## 2016-12-13 DIAGNOSIS — M25632 Stiffness of left wrist, not elsewhere classified: Secondary | ICD-10-CM | POA: Diagnosis not present

## 2016-12-13 NOTE — Therapy (Signed)
[place], [place], [address] Phone: [phone]   Fax:  [phone]  Occupational Therapy Treatment  Patient Details  Name: Regina Berry MRN: [id number] Date of Birth: 1951/05/31 Referring Provider: Dr. Arther Abbott  Encounter Date: 12/13/2016      OT End of Session - 12/13/16 1213    Visit Number 5   Number of Visits 16   Date for OT Re-Evaluation 01/29/17  mini reassess on 12/29/16   Authorization Type Medicare   Authorization Time Period before 10th visit   Authorization - Visit Number 5   Authorization - Number of Visits 10   OT Start Time 0950   OT Stop Time 1029   OT Time Calculation (min) 39 min   Activity Tolerance Patient tolerated treatment well   Behavior During Therapy [facility] for tasks assessed/performed      Past Medical History:  Diagnosis Date  . Anxiety   . Hypertension   . Hypothyroidism   . Pelvic pressure in female 01/23/2014  . Positive fecal occult blood test 06/23/2015  . Rectocele 01/23/2014  . 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 Thyroid disease     Past Surgical History:  Procedure Laterality Date  . CESAREAN SECTION    . COLONOSCOPY N/A 09/11/2015   Procedure: COLONOSCOPY;  Surgeon: Rogene Houston, MD;  [facility];  Service: Endoscopy;  Laterality: N/A;  moved to 1/6 @ 1:35 - Ann to notify pt  . LUNG SURGERY      There were no vitals filed for this visit.      Subjective Assessment - 12/13/16 0950    Subjective  S: It hasn't been hurting too much since yesterday.    Currently in Pain? No/denies            [facility] OT Assessment - 12/13/16 1212      Assessment   Diagnosis Left Colles Fracture     Precautions   Precautions None                  OT Treatments/Exercises (OP) - 12/13/16 0952      Exercises   Exercises Wrist;Hand     Weighted Stretch Over Towel Roll   Supination - Weighted Stretch 3 pounds;60 seconds   Wrist Flexion -  Weighted Stretch 3 pounds;60 seconds   Wrist Extension - Weighted Stretch 3 pounds;60 seconds   Radial Deviation - Weighted Stretch 3 pounds;60 seconds     Wrist Exercises   Forearm Supination PROM;5 reps;Strengthening;15 reps   Bar Weights/Barbell (Forearm Supination) 3 lbs   Forearm Pronation PROM;5 reps;Strengthening;15 reps   Bar Weights/Barbell (Forearm Pronation) 3 lbs   Wrist Flexion PROM;5 reps;Strengthening;15 reps   Bar Weights/Barbell (Wrist Flexion) 3 lbs   Wrist Extension PROM;5 reps;Strengthening;15 reps   Bar Weights/Barbell (Wrist Extension) 3 lbs   Wrist Radial Deviation PROM;5 reps;Strengthening   Bar Weights/Barbell (Radial Deviation) 3 lbs   Wrist Ulnar Deviation PROM;5 reps;Strengthening   Bar Weights/Barbell (Ulnar Deviation) 3 lbs     Additional Wrist Exercises   Hand Gripper with Large Beads all beads with gripper set at 11#   Hand Gripper with Medium Beads all beads with gripper set at 11#   Hand Gripper with Small Beads all beads with beads set at 11     Manual Therapy   Manual Therapy Myofascial release   Manual therapy comments manual therapy completed seperately from all other interventions   Myofascial Release myofascial release and  manual stretching and edema reduction to left forearm, wrist, hand and associated areas to decrease pain and improve pain free moblity.                  OT Short Term Goals - 12/06/16 1022      OT SHORT TERM GOAL #1   Title Patient will be educated on a HEP for left wrist and hand A/ROM for improved functional use of left arm.   Time 4   Period Weeks   Status On-going     OT SHORT TERM GOAL #2   Title Patient will improve left forearm, wrist, and hand P/ROM to [facility] for improved ability to wash dishes.   Time 4   Period Weeks   Status On-going     OT SHORT TERM GOAL #3   Title Patient will improve left forearm, wrist strength to 4/5 for improved ability to lift pots and pans.   Time 4   Period Weeks    Status On-going     OT SHORT TERM GOAL #4   Title Patient will improve left grip strength by 20 pounds and pinch strength by 3 pounds for improved ability to maintain grasp on her hairdryer.   Time 4   Period Weeks   Status On-going     OT SHORT TERM GOAL #5   Title Patient will decrease edema in her left wrist by .75 cm for improved functional use of left arm with daily tasks.   Time 4   Period Weeks   Status On-going     OT SHORT TERM GOAL #6   Title Patient will improve left hand coordination by improving ability to complete nine hole peg test in 21 seconds or less.   Time 4   Period Weeks   Status On-going     OT SHORT TERM GOAL #7   Title Patient will decrease left hand and wrist pain to 3/10 when completing functional activities.   Time 4   Period Weeks   Status On-going           OT Long Term Goals - 12/06/16 1023      OT LONG TERM GOAL #1   Title Patient will return to prior level of independence with all B/IADLs, work, and leisure activities using left arm and hand actively.   Time 8   Period Weeks   Status On-going     OT LONG TERM GOAL #2   Title Patient will improve left forearm, wrist, and hand A/ROM to WNL for improved ability to fasten bra and fix her hair.    Time 8   Period Weeks   Status On-going     OT LONG TERM GOAL #3   Title Patient will improve left forearm and wrist strength to 5/5 in order to lift pots and pans at work and push up from a seated position.   Time 8   Period Weeks   Status On-going     OT LONG TERM GOAL #4   Title Patient will improve left grip strength by 35 pounds and pinch strength by 6 pounds in order to maintain grasp on pots and pans at work.     Time 8   Period Weeks   Status On-going     OT LONG TERM GOAL #5   Title Patient will demonstrate normal fine motor coordination needed for fastening her bra by completing nine hole peg test in 20 seconds or less.    Time 8  Period Weeks   Status On-going     OT LONG  TERM GOAL #6   Title Patient will decrease edema in left wrist by 1.0 cm or greater for improved flexibility needed for ADL completion.   Time 8   Period Weeks   Status On-going     OT LONG TERM GOAL #7   Title Patient will decrase pain in her left wrist to 1/10 or better during ADL completion.   Time 8   Status On-going     OT LONG TERM GOAL #8   Title Patient will be able to oppose to her small finger DIPJ for improved dexterity needed for ADL completion.    Time 8   Period Weeks   Status On-going               Plan - 12/13/16 1217    Clinical Impression Statement A: Continued with gripper activity, attempted 15# however pt unable to complete due to weakness. Pt did well with prolonged stretching during manual therapy. Pt reports weakness with radial/ulnar deviation, resumed strengthening. Tweezer task not completed due to time constraints.    Plan P: Complete tweezer task, attempt to increase gripper to 15#, work on radial/ulnar deviation   Consulted and Agree with Plan of Care Patient      Patient will benefit from skilled therapeutic intervention in order to improve the following deficits and impairments:  Decreased coordination, Decreased range of motion, Decreased strength, Increased edema, Increased fascial restricitons, Increased muscle spasms, Impaired UE functional use, Pain  Visit Diagnosis: Pain in left wrist  Other symptoms and signs involving the musculoskeletal system  Localized edema  Stiffness of left wrist, not elsewhere classified  Stiffness of left hand, not elsewhere classified    Problem List Patient Active Problem List   Diagnosis Date Noted  . Depression 08/18/2015  . Positive fecal occult blood test 06/23/2015  . Pelvic pressure in female 01/23/2014  . Rectocele 01/23/2014   Guadelupe Sabin, OTR/L  [phone] 12/13/2016, 12:21 PM  [place] [address] [place], [place],  [address] Phone: [phone]   Fax:  [phone]  Name: Regina Berry MRN: [id number] Date of Birth: Jan 05, 1951

## 2016-12-20 ENCOUNTER — Encounter (HOSPITAL_COMMUNITY): Payer: Self-pay | Admitting: Occupational Therapy

## 2016-12-20 ENCOUNTER — Ambulatory Visit (HOSPITAL_COMMUNITY): Payer: Medicare Other | Admitting: Occupational Therapy

## 2016-12-20 DIAGNOSIS — M25532 Pain in left wrist: Secondary | ICD-10-CM

## 2016-12-20 DIAGNOSIS — M25642 Stiffness of left hand, not elsewhere classified: Secondary | ICD-10-CM | POA: Diagnosis not present

## 2016-12-20 DIAGNOSIS — M25632 Stiffness of left wrist, not elsewhere classified: Secondary | ICD-10-CM | POA: Diagnosis not present

## 2016-12-20 DIAGNOSIS — R29898 Other symptoms and signs involving the musculoskeletal system: Secondary | ICD-10-CM

## 2016-12-20 DIAGNOSIS — R600 Localized edema: Secondary | ICD-10-CM

## 2016-12-20 NOTE — Therapy (Signed)
[place], [place], [address] Phone: [phone]   Fax:  [phone]  Occupational Therapy Treatment  Patient Details  Name: Regina Berry MRN: [id number] Date of Birth: [birth year] Referring Provider: Dr. Arther Abbott  Encounter Date: 12/20/2016      OT End of Session - 12/20/16 1033    Visit Number 6   Number of Visits 16   Date for OT Re-Evaluation 01/29/17  mini reassess on 12/29/16   Authorization Type Medicare   Authorization Time Period before 10th visit   Authorization - Visit Number 6   Authorization - Number of Visits 10   OT Start Time [phone]   OT Stop Time 1030   OT Time Calculation (min) 43 min   Activity Tolerance Patient tolerated treatment well   Behavior During Therapy Regina Berry for tasks assessed/performed      Past Medical History:  Diagnosis Date  . Anxiety   . Hypertension   . Hypothyroidism   . Pelvic pressure in female 01/23/2014  . Positive fecal occult blood test 06/23/2015  . Rectocele 01/23/2014  . 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 Thyroid disease     Past Surgical History:  Procedure Laterality Date  . CESAREAN SECTION    . COLONOSCOPY N/A 09/11/2015   Procedure: COLONOSCOPY;  Surgeon: Rogene Houston, MD;  [facility];  Service: Endoscopy;  Laterality: N/A;  moved to 1/6 @ 1:35 - Ann to notify pt  . LUNG SURGERY      There were no vitals filed for this visit.      Subjective Assessment - 12/20/16 1016    Subjective  S: I've been able to use my wrist a lot without thinking about it.    Currently in Pain? No/denies            [facility] OT Assessment - 12/20/16 0947      Assessment   Diagnosis Left Colles Fracture     Precautions   Precautions None                  OT Treatments/Exercises (OP) - 12/20/16 1014      Exercises   Exercises Wrist;Hand     Weighted Stretch Over Towel Roll   Supination - Weighted Stretch 3 pounds;60 seconds   Wrist Flexion - Weighted Stretch 3 pounds;60 seconds   Wrist Extension - Weighted Stretch 3 pounds;60 seconds     Wrist Exercises   Forearm Supination PROM;5 reps   Forearm Pronation PROM;5 reps   Wrist Flexion PROM;5 reps   Wrist Extension PROM;5 reps   Wrist Radial Deviation PROM;5 reps   Wrist Ulnar Deviation PROM;5 reps     Additional Wrist Exercises   Hand Gripper with Large Beads all beads with gripper set at 22#   Hand Gripper with Medium Beads all beads with gripper set at 22#     Fine Motor Coordination   Fine Motor Coordination Small Pegboard   Small Pegboard pt completed small pegboard task using tweezers to place pegs in specified design. Pt with mod difficulty initially, improving as tas progressed. Increased time for task completion.      Manual Therapy   Manual Therapy Myofascial release   Manual therapy comments manual therapy completed seperately from all other interventions   Myofascial Release myofascial release and manual stretching and edema reduction to left forearm, wrist, hand and associated areas to decrease pain and improve pain free moblity.  OT Short Term Goals - 12/06/16 1022      OT SHORT TERM GOAL #1   Title Patient will be educated on a HEP for left wrist and hand A/ROM for improved functional use of left arm.   Time 4   Period Weeks   Status On-going     OT SHORT TERM GOAL #2   Title Patient will improve left forearm, wrist, and hand P/ROM to [facility] for improved ability to wash dishes.   Time 4   Period Weeks   Status On-going     OT SHORT TERM GOAL #3   Title Patient will improve left forearm, wrist strength to 4/5 for improved ability to lift pots and pans.   Time 4   Period Weeks   Status On-going     OT SHORT TERM GOAL #4   Title Patient will improve left grip strength by 20 pounds and pinch strength by 3 pounds for improved ability to maintain grasp on her hairdryer.   Time 4   Period Weeks   Status  On-going     OT SHORT TERM GOAL #5   Title Patient will decrease edema in her left wrist by .75 cm for improved functional use of left arm with daily tasks.   Time 4   Period Weeks   Status On-going     OT SHORT TERM GOAL #6   Title Patient will improve left hand coordination by improving ability to complete nine hole peg test in 21 seconds or less.   Time 4   Period Weeks   Status On-going     OT SHORT TERM GOAL #7   Title Patient will decrease left hand and wrist pain to 3/10 when completing functional activities.   Time 4   Period Weeks   Status On-going           OT Long Term Goals - 12/06/16 1023      OT LONG TERM GOAL #1   Title Patient will return to prior level of independence with all B/IADLs, work, and leisure activities using left arm and hand actively.   Time 8   Period Weeks   Status On-going     OT LONG TERM GOAL #2   Title Patient will improve left forearm, wrist, and hand A/ROM to WNL for improved ability to fasten bra and fix her hair.    Time 8   Period Weeks   Status On-going     OT LONG TERM GOAL #3   Title Patient will improve left forearm and wrist strength to 5/5 in order to lift pots and pans at work and push up from a seated position.   Time 8   Period Weeks   Status On-going     OT LONG TERM GOAL #4   Title Patient will improve left grip strength by 35 pounds and pinch strength by 6 pounds in order to maintain grasp on pots and pans at work.     Time 8   Period Weeks   Status On-going     OT LONG TERM GOAL #5   Title Patient will demonstrate normal fine motor coordination needed for fastening her bra by completing nine hole peg test in 20 seconds or less.    Time 8   Period Weeks   Status On-going     OT LONG TERM GOAL #6   Title Patient will decrease edema in left wrist by 1.0 cm or greater for improved flexibility needed for ADL completion.  Time 8   Period Weeks   Status On-going     OT LONG TERM GOAL #7   Title Patient  will decrase pain in her left wrist to 1/10 or better during ADL completion.   Time 8   Status On-going     OT LONG TERM GOAL #8   Title Patient will be able to oppose to her small finger DIPJ for improved dexterity needed for ADL completion.    Time 8   Period Weeks   Status On-going               Plan - 12/20/16 1033    Clinical Impression Statement A: Pt reports she is using her hand naturally without having to think about it as much, she is now able to fasten her bra independently. Added fine motor coordination activity this session. Continued with passive stretching and weighted stretches. Pt with difficulty with radial deviation when using gripper.    Plan P: complete grooved pegboard coordination tasks, continue working on radial/ulnar deviation   Consulted and Agree with Plan of Care Patient      Patient will benefit from skilled therapeutic intervention in order to improve the following deficits and impairments:  Decreased coordination, Decreased range of motion, Decreased strength, Increased edema, Increased fascial restricitons, Increased muscle spasms, Impaired UE functional use, Pain  Visit Diagnosis: Pain in left wrist  Other symptoms and signs involving the musculoskeletal system  Localized edema  Stiffness of left wrist, not elsewhere classified  Stiffness of left hand, not elsewhere classified    Problem List Patient Active Problem List   Diagnosis Date Noted  . Depression 08/18/2015  . Positive fecal occult blood test 06/23/2015  . Pelvic pressure in female 01/23/2014  . Rectocele 01/23/2014   Guadelupe Sabin, OTR/L  [phone] 12/20/2016, 10:37 AM  [facility] [address] [place], [place], [address] Phone: [phone]   Fax:  [phone]  Name: Regina Berry MRN: [id number] Date of Birth: 05/17/1951

## 2016-12-22 ENCOUNTER — Ambulatory Visit (HOSPITAL_COMMUNITY): Payer: Medicare Other | Admitting: Occupational Therapy

## 2016-12-22 ENCOUNTER — Encounter (HOSPITAL_COMMUNITY): Payer: Self-pay | Admitting: Occupational Therapy

## 2016-12-22 DIAGNOSIS — M25642 Stiffness of left hand, not elsewhere classified: Secondary | ICD-10-CM | POA: Diagnosis not present

## 2016-12-22 DIAGNOSIS — M25532 Pain in left wrist: Secondary | ICD-10-CM

## 2016-12-22 DIAGNOSIS — R29898 Other symptoms and signs involving the musculoskeletal system: Secondary | ICD-10-CM

## 2016-12-22 DIAGNOSIS — M25632 Stiffness of left wrist, not elsewhere classified: Secondary | ICD-10-CM

## 2016-12-22 DIAGNOSIS — R600 Localized edema: Secondary | ICD-10-CM

## 2016-12-22 NOTE — Therapy (Signed)
[place], [place], [address] Phone: [phone]   Fax:  [phone]  Occupational Therapy Treatment  Patient Details  Name: Regina Berry MRN: [id number] Date of Birth: 05-01-1951 Referring Provider: Dr. Arther Abbott  Encounter Date: 12/22/2016      OT End of Session - 12/22/16 1048    Visit Number 7   Number of Visits 16   Date for OT Re-Evaluation 01/29/17  mini reassess on 12/29/16   Authorization Type Medicare   Authorization Time Period before 10th visit   Authorization - Visit Number 7   Authorization - Number of Visits 10   OT Start Time [phone]   OT Stop Time 1028   OT Time Calculation (min) 41 min   Activity Tolerance Patient tolerated treatment well   Behavior During Therapy [facility] for tasks assessed/performed      Past Medical History:  Diagnosis Date  . Anxiety   . Hypertension   . Hypothyroidism   . Pelvic pressure in female 01/23/2014  . Positive fecal occult blood test 06/23/2015  . Rectocele 01/23/2014  . 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 Thyroid disease     Past Surgical History:  Procedure Laterality Date  . CESAREAN SECTION    . COLONOSCOPY N/A 09/11/2015   Procedure: COLONOSCOPY;  Surgeon: Rogene Houston, MD;  [facility];  Service: Endoscopy;  Laterality: N/A;  moved to 1/6 @ 1:35 - Ann to notify pt  . LUNG SURGERY      There were no vitals filed for this visit.      Subjective Assessment - 12/22/16 0947    Subjective  S: I'm ready for this swelling to go away.    Currently in Pain? No/denies            [facility] OT Assessment - 12/22/16 0946      Assessment   Diagnosis Left Colles Fracture     Precautions   Precautions None                  OT Treatments/Exercises (OP) - 12/22/16 0949      Exercises   Exercises Wrist;Hand     Weighted Stretch Over Towel Roll   Supination - Weighted Stretch 3 pounds;60 seconds   Wrist Flexion - Weighted  Stretch 3 pounds;60 seconds   Wrist Extension - Weighted Stretch 3 pounds;60 seconds     Wrist Exercises   Forearm Supination PROM;5 reps;Strengthening;15 reps   Bar Weights/Barbell (Forearm Supination) 3 lbs   Forearm Pronation PROM;5 reps;Strengthening;15 reps   Bar Weights/Barbell (Forearm Pronation) 3 lbs   Wrist Flexion PROM;5 reps;Strengthening;15 reps   Bar Weights/Barbell (Wrist Flexion) 3 lbs   Wrist Extension PROM;5 reps;Strengthening;15 reps   Bar Weights/Barbell (Wrist Extension) 3 lbs   Wrist Radial Deviation PROM;5 reps;Strengthening;15 reps   Bar Weights/Barbell (Radial Deviation) 3 lbs   Wrist Ulnar Deviation PROM;5 reps;Strengthening;15 reps   Bar Weights/Barbell (Ulnar Deviation) 3 lbs     Additional Wrist Exercises   Hand Gripper with Small Beads all beads with beads set at 15#     Fine Motor Coordination   Grooved pegs Pt used tweezers to place grooved pegs into pegboard working on fine motor coordination and wrist ROM. Pt with mod difficulty. Pt then removed pegs, attempting to keep all pegs in palm.      Manual Therapy   Manual Therapy Myofascial release   Manual therapy comments manual therapy completed seperately from all other  interventions   Myofascial Release myofascial release and manual stretching and edema reduction to left forearm, wrist, hand and associated areas to decrease pain and improve pain free moblity.                  OT Short Term Goals - 12/06/16 1022      OT SHORT TERM GOAL #1   Title Patient will be educated on a HEP for left wrist and hand A/ROM for improved functional use of left arm.   Time 4   Period Weeks   Status On-going     OT SHORT TERM GOAL #2   Title Patient will improve left forearm, wrist, and hand P/ROM to [facility] for improved ability to wash dishes.   Time 4   Period Weeks   Status On-going     OT SHORT TERM GOAL #3   Title Patient will improve left forearm, wrist strength to 4/5 for improved ability to  lift pots and pans.   Time 4   Period Weeks   Status On-going     OT SHORT TERM GOAL #4   Title Patient will improve left grip strength by 20 pounds and pinch strength by 3 pounds for improved ability to maintain grasp on her hairdryer.   Time 4   Period Weeks   Status On-going     OT SHORT TERM GOAL #5   Title Patient will decrease edema in her left wrist by .75 cm for improved functional use of left arm with daily tasks.   Time 4   Period Weeks   Status On-going     OT SHORT TERM GOAL #6   Title Patient will improve left hand coordination by improving ability to complete nine hole peg test in 21 seconds or less.   Time 4   Period Weeks   Status On-going     OT SHORT TERM GOAL #7   Title Patient will decrease left hand and wrist pain to 3/10 when completing functional activities.   Time 4   Period Weeks   Status On-going           OT Long Term Goals - 12/06/16 1023      OT LONG TERM GOAL #1   Title Patient will return to prior level of independence with all B/IADLs, work, and leisure activities using left arm and hand actively.   Time 8   Period Weeks   Status On-going     OT LONG TERM GOAL #2   Title Patient will improve left forearm, wrist, and hand A/ROM to WNL for improved ability to fasten bra and fix her hair.    Time 8   Period Weeks   Status On-going     OT LONG TERM GOAL #3   Title Patient will improve left forearm and wrist strength to 5/5 in order to lift pots and pans at work and push up from a seated position.   Time 8   Period Weeks   Status On-going     OT LONG TERM GOAL #4   Title Patient will improve left grip strength by 35 pounds and pinch strength by 6 pounds in order to maintain grasp on pots and pans at work.     Time 8   Period Weeks   Status On-going     OT LONG TERM GOAL #5   Title Patient will demonstrate normal fine motor coordination needed for fastening her bra by completing nine hole peg test in 20 seconds  or less.    Time 8    Period Weeks   Status On-going     OT LONG TERM GOAL #6   Title Patient will decrease edema in left wrist by 1.0 cm or greater for improved flexibility needed for ADL completion.   Time 8   Period Weeks   Status On-going     OT LONG TERM GOAL #7   Title Patient will decrase pain in her left wrist to 1/10 or better during ADL completion.   Time 8   Status On-going     OT LONG TERM GOAL #8   Title Patient will be able to oppose to her small finger DIPJ for improved dexterity needed for ADL completion.    Time 8   Period Weeks   Status On-going               Plan - 12/22/16 1048    Clinical Impression Statement A: Pt reports continued swelling in her right hand, she did use it to clean house yesterday. Focused on wrist strengthening and fine motor coordination tasks this session, with emphasis on radial/ulnar deviation.    Plan P: Follow up on MD appt. Increase weighted stretch to 4# if pt able to tolerate; update G-code   Consulted and Agree with Plan of Care Patient      Patient will benefit from skilled therapeutic intervention in order to improve the following deficits and impairments:  Decreased coordination, Decreased range of motion, Decreased strength, Increased edema, Increased fascial restricitons, Increased muscle spasms, Impaired UE functional use, Pain  Visit Diagnosis: Pain in left wrist  Other symptoms and signs involving the musculoskeletal system  Localized edema  Stiffness of left wrist, not elsewhere classified  Stiffness of left hand, not elsewhere classified    Problem List Patient Active Problem List   Diagnosis Date Noted  . Depression 08/18/2015  . Positive fecal occult blood test 06/23/2015  . Pelvic pressure in female 01/23/2014  . Rectocele 01/23/2014   Guadelupe Sabin, OTR/L  [phone] 12/22/2016, 10:51 AM  [facility] [address] [place], [place], [address] Phone: [phone]    Fax:  [phone]  Name: Regina Berry MRN: [id number] Date of Birth: [birth year]

## 2016-12-27 ENCOUNTER — Encounter: Payer: Self-pay | Admitting: Orthopedic Surgery

## 2016-12-27 ENCOUNTER — Encounter (HOSPITAL_COMMUNITY): Payer: Self-pay

## 2016-12-27 ENCOUNTER — Ambulatory Visit (HOSPITAL_COMMUNITY): Payer: Medicare Other

## 2016-12-27 ENCOUNTER — Ambulatory Visit (INDEPENDENT_AMBULATORY_CARE_PROVIDER_SITE_OTHER): Payer: Medicare Other | Admitting: Orthopedic Surgery

## 2016-12-27 DIAGNOSIS — S52532D Colles' fracture of left radius, subsequent encounter for closed fracture with routine healing: Secondary | ICD-10-CM | POA: Diagnosis not present

## 2016-12-27 DIAGNOSIS — R600 Localized edema: Secondary | ICD-10-CM

## 2016-12-27 DIAGNOSIS — M25532 Pain in left wrist: Secondary | ICD-10-CM

## 2016-12-27 DIAGNOSIS — M25632 Stiffness of left wrist, not elsewhere classified: Secondary | ICD-10-CM | POA: Diagnosis not present

## 2016-12-27 DIAGNOSIS — R29898 Other symptoms and signs involving the musculoskeletal system: Secondary | ICD-10-CM

## 2016-12-27 DIAGNOSIS — M25642 Stiffness of left hand, not elsewhere classified: Secondary | ICD-10-CM | POA: Diagnosis not present

## 2016-12-27 NOTE — Therapy (Signed)
[place], [place], [address] Phone: [phone]   Fax:  [phone]  Occupational Therapy Treatment  Patient Details  Name: Regina Berry MRN: [id number] Date of Birth: 04-Feb-1951 Referring Provider: Dr. Arther Abbott  Encounter Date: 12/27/2016      OT End of Session - 12/27/16 1256    Visit Number 8   Number of Visits 16   Date for OT Re-Evaluation 01/29/17  mini reassess on 12/29/16   Authorization Type Medicare   Authorization Time Period before 10th visit   Authorization - Visit Number 8   Authorization - Number of Visits 10   OT Start Time 1120   OT Stop Time 1202   OT Time Calculation (min) 42 min   Activity Tolerance Patient tolerated treatment well   Behavior During Therapy [facility] for tasks assessed/performed      Past Medical History:  Diagnosis Date  . Anxiety   . Hypertension   . Hypothyroidism   . Pelvic pressure in female 01/23/2014  . Positive fecal occult blood test 06/23/2015  . Rectocele 01/23/2014  . 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 Thyroid disease     Past Surgical History:  Procedure Laterality Date  . CESAREAN SECTION    . COLONOSCOPY N/A 09/11/2015   Procedure: COLONOSCOPY;  Surgeon: Rogene Houston, MD;  [facility];  Service: Endoscopy;  Laterality: N/A;  moved to 1/6 @ 1:35 - Ann to notify pt  . LUNG SURGERY      There were no vitals filed for this visit.      Subjective Assessment - 12/27/16 1137    Subjective  S: I can return to work tomorrow,    Currently in Pain? No/denies            [facility] OT Assessment - 12/27/16 1138      Assessment   Diagnosis Left Colles Fracture     Precautions   Precautions None                  OT Treatments/Exercises (OP) - 12/27/16 1138      Exercises   Exercises Wrist;Hand;Work Hardening     Weighted Stretch Over Towel Roll   Supination - Weighted Stretch 60 seconds  4lbs   Wrist Flexion -  Weighted Stretch 60 seconds  4lbs   Wrist Extension - Weighted Stretch 60 seconds  4lbs   Radial Deviation - Weighted Stretch 60 seconds  4lbs     Wrist Exercises   Forearm Supination PROM;5 reps;Strengthening  12X   Bar Weights/Barbell (Forearm Supination) 4 lbs   Forearm Pronation PROM;5 reps;Strengthening  12X   Bar Weights/Barbell (Forearm Pronation) 4 lbs   Wrist Flexion PROM;5 reps;Strengthening  12X   Bar Weights/Barbell (Wrist Flexion) 4 lbs   Wrist Extension PROM;5 reps;Strengthening  12X   Bar Weights/Barbell (Wrist Extension) 4 lbs   Wrist Radial Deviation PROM;5 reps;Strengthening  12X   Bar Weights/Barbell (Radial Deviation) 4 lbs   Wrist Ulnar Deviation PROM;5 reps;Strengthening  12X   Bar Weights/Barbell (Ulnar Deviation) 4 lbs     Additional Wrist Exercises   Hand Gripper with Large Beads all beads with gripper set at 22#   Hand Gripper with Medium Beads all beads with gripper set at 22#   Hand Gripper with Small Beads all beads with grippers set at 22#     Work Hardening Exercises   [employer] pt completed work simulation task in which she lifted the  weighted box starting with 5lbs and progressively adding weight up to 34 lbs. Patient picked up box from waist level and then transfered it to the end of the table to simulate work tasks.                  OT Short Term Goals - 12/06/16 1022      OT SHORT TERM GOAL #1   Title Patient will be educated on a HEP for left wrist and hand A/ROM for improved functional use of left arm.   Time 4   Period Weeks   Status On-going     OT SHORT TERM GOAL #2   Title Patient will improve left forearm, wrist, and hand P/ROM to [facility] for improved ability to wash dishes.   Time 4   Period Weeks   Status On-going     OT SHORT TERM GOAL #3   Title Patient will improve left forearm, wrist strength to 4/5 for improved ability to lift pots and pans.   Time 4   Period Weeks   Status On-going     OT SHORT TERM  GOAL #4   Title Patient will improve left grip strength by 20 pounds and pinch strength by 3 pounds for improved ability to maintain grasp on her hairdryer.   Time 4   Period Weeks   Status On-going     OT SHORT TERM GOAL #5   Title Patient will decrease edema in her left wrist by .75 cm for improved functional use of left arm with daily tasks.   Time 4   Period Weeks   Status On-going     OT SHORT TERM GOAL #6   Title Patient will improve left hand coordination by improving ability to complete nine hole peg test in 21 seconds or less.   Time 4   Period Weeks   Status On-going     OT SHORT TERM GOAL #7   Title Patient will decrease left hand and wrist pain to 3/10 when completing functional activities.   Time 4   Period Weeks   Status On-going           OT Long Term Goals - 12/06/16 1023      OT LONG TERM GOAL #1   Title Patient will return to prior level of independence with all B/IADLs, work, and leisure activities using left arm and hand actively.   Time 8   Period Weeks   Status On-going     OT LONG TERM GOAL #2   Title Patient will improve left forearm, wrist, and hand A/ROM to WNL for improved ability to fasten bra and fix her hair.    Time 8   Period Weeks   Status On-going     OT LONG TERM GOAL #3   Title Patient will improve left forearm and wrist strength to 5/5 in order to lift pots and pans at work and push up from a seated position.   Time 8   Period Weeks   Status On-going     OT LONG TERM GOAL #4   Title Patient will improve left grip strength by 35 pounds and pinch strength by 6 pounds in order to maintain grasp on pots and pans at work.     Time 8   Period Weeks   Status On-going     OT LONG TERM GOAL #5   Title Patient will demonstrate normal fine motor coordination needed for fastening her bra by completing nine hole  peg test in 20 seconds or less.    Time 8   Period Weeks   Status On-going     OT LONG TERM GOAL #6   Title Patient will  decrease edema in left wrist by 1.0 cm or greater for improved flexibility needed for ADL completion.   Time 8   Period Weeks   Status On-going     OT LONG TERM GOAL #7   Title Patient will decrase pain in her left wrist to 1/10 or better during ADL completion.   Time 8   Status On-going     OT LONG TERM GOAL #8   Title Patient will be able to oppose to her small finger DIPJ for improved dexterity needed for ADL completion.    Time 8   Period Weeks   Status On-going               Plan - 12/27/16 1257    Clinical Impression Statement A: Increased weighted stretch and wrist strengthening exercises by using 4#. VC for form and technique during strengthening and pinch task using clothespins. 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 Education given for proper body mechanics and joint protection to use while working.   Plan P: reassess and discharge with HEP. Update G code. Inquire if patient was able to locate her edema glove.      Patient will benefit from skilled therapeutic intervention in order to improve the following deficits and impairments:  Decreased coordination, Decreased range of motion, Decreased strength, Increased edema, Increased fascial restricitons, Increased muscle spasms, Impaired UE functional use, Pain  Visit Diagnosis: Pain in left wrist  Other symptoms and signs involving the musculoskeletal system  Localized edema  Stiffness of left wrist, not elsewhere classified  Stiffness of left hand, not elsewhere classified    Problem List Patient Active Problem List   Diagnosis Date Noted  . Depression 08/18/2015  . Positive fecal occult blood test 06/23/2015  . Pelvic pressure in female 01/23/2014  . Rectocele 01/23/2014   Ailene Ravel, OTR/L,CBIS  [phone]  12/27/2016, 12:59 PM  [facility] [address] [place], [place], [address] Phone:  [phone]   Fax:  [phone]  Name: Regina Berry MRN: [id number] Date of Birth: 10-25-1950

## 2016-12-27 NOTE — Patient Instructions (Signed)
RETURN TO WORK NOTE

## 2016-12-27 NOTE — Progress Notes (Signed)
FOLLOW UP   Chief Complaint  Patient presents with  . Follow-up    LEFT COLLES FRACTURE, DOI 09/14/16   History the patient had a left Colles' fracture treated with cast. She had some post cast removal stiffness and went to occupational therapy which helped her. Her only complaints are decreased flexion  The range of motion shows normal extension compared to the opposite wrist and 10 decreased flexion between the right and left with normal pronation and 5 loss of supination with arm at her side  No fracture site tenderness  She is released to return to cafeteria duty.  Current Outpatient Prescriptions:  .  ALPRAZolam (XANAX) 0.5 MG tablet, Take 0.5 mg by mouth daily as needed. anxiety, Disp: , Rfl:  .  chlorpheniramine-HYDROcodone (TUSSIONEX) 10-8 MG/5ML SUER, Take 5 mLs by mouth., Disp: , Rfl:  .  citalopram (CELEXA) 40 MG tablet, Take 40 mg by mouth daily. , Disp: , Rfl:  .  furosemide (LASIX) 20 MG tablet, Take 20 mg by mouth daily as needed. fluid, Disp: , Rfl:  .  HYDROcodone-acetaminophen (NORCO/VICODIN) 5-325 MG tablet, Take 1 tablet by mouth every 6 (six) hours as needed for moderate pain., Disp: 30 tablet, Rfl: 0 .  hydrocortisone (ANUSOL-HC) 25 MG suppository, Place 1 suppository (25 mg total) rectally at bedtime., Disp: 14 suppository, Rfl: 1 .  levothyroxine (SYNTHROID, LEVOTHROID) 75 MCG tablet, Take 75 mcg by mouth daily., Disp: , Rfl:  .  lisinopril-hydrochlorothiazide (PRINZIDE,ZESTORETIC) 20-25 MG per tablet, Take by mouth daily. Takes half a tab daily, Disp: , Rfl:  .  NIFEdipine (PROCARDIA-XL/ADALAT CC) 30 MG 24 hr tablet, Take 30 mg by mouth daily., Disp: , Rfl:  .  Omega-3 Fatty Acids (FISH OIL) 1000 MG CAPS, Take by mouth daily., Disp: , Rfl:  .  Wheat Dextrin (BENEFIBER DRINK MIX) PACK, Take 4 g by mouth at bedtime., Disp: , Rfl:  .  zolpidem (AMBIEN) 10 MG tablet, Take 5 mg by mouth at bedtime as needed. sleep, Disp: , Rfl:      Encounter Diagnosis  Name  Primary?  . Closed Colles' fracture of left radius with routine healing, subsequent encounter Yes

## 2016-12-29 ENCOUNTER — Ambulatory Visit (HOSPITAL_COMMUNITY): Payer: Medicare Other | Admitting: Occupational Therapy

## 2016-12-29 ENCOUNTER — Encounter (HOSPITAL_COMMUNITY): Payer: Self-pay | Admitting: Occupational Therapy

## 2016-12-29 DIAGNOSIS — M25532 Pain in left wrist: Secondary | ICD-10-CM | POA: Diagnosis not present

## 2016-12-29 DIAGNOSIS — M25632 Stiffness of left wrist, not elsewhere classified: Secondary | ICD-10-CM

## 2016-12-29 DIAGNOSIS — R600 Localized edema: Secondary | ICD-10-CM | POA: Diagnosis not present

## 2016-12-29 DIAGNOSIS — R29898 Other symptoms and signs involving the musculoskeletal system: Secondary | ICD-10-CM

## 2016-12-29 DIAGNOSIS — M25642 Stiffness of left hand, not elsewhere classified: Secondary | ICD-10-CM | POA: Diagnosis not present

## 2016-12-29 NOTE — Patient Instructions (Signed)
Home Exercises Program °Theraputty Exercises °   °Do the following exercises 1-2 times a day using your affected hand.  °1. Roll putty into a ball. ° °2. Make into a pancake. ° °3. Roll putty into a roll. ° °4. Pinch along log with first finger and thumb.  ° °5. Make into a ball. ° °6. Roll it back into a log.  ° °7. Pinch using thumb and side of first finger. ° °8. Roll into a ball, then flatten into a pancake. ° °9. Using your fingers, make putty into a mountain. ° °

## 2016-12-29 NOTE — Therapy (Signed)
[place], [place], [address] Phone: [phone]   Fax:  [phone]  Occupational Therapy Treatment and Discharge Patient Details  Name: Regina Berry MRN: [id number] Date of Birth: 1951/08/19 Referring Provider: Dr. Arther Abbott  Encounter Date: 12/29/2016      OT End of Session - 12/29/16 1647    Visit Number 9   Number of Visits 16   Date for OT Re-Evaluation 01/29/17  mini reassess on 12/29/16   Authorization Type Medicare   Authorization Time Period before 10th visit   Authorization - Visit Number 9   Authorization - Number of Visits 10   OT Start Time 1601   OT Stop Time 1635   OT Time Calculation (min) 34 min   Activity Tolerance Patient tolerated treatment well   Behavior During Therapy [facility] for tasks assessed/performed      Past Medical History:  Diagnosis Date  . Anxiety   . Hypertension   . Hypothyroidism   . Pelvic pressure in female 01/23/2014  . Positive fecal occult blood test 06/23/2015  . Rectocele 01/23/2014  . 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 Thyroid disease     Past Surgical History:  Procedure Laterality Date  . CESAREAN SECTION    . COLONOSCOPY N/A 09/11/2015   Procedure: COLONOSCOPY;  Surgeon: Rogene Houston, MD;  Location: [facility];  Service: Endoscopy;  Laterality: N/A;  moved to 1/6 @ 1:35 - Ann to notify pt  . LUNG SURGERY      There were no vitals filed for this visit.      Subjective Assessment - 12/29/16 1600    Subjective  S: I went back to work and it really hasn't bothered me.    Special Tests FOTO: 66/100 (34% impairment)   Currently in Pain? No/denies            [facility] OT Assessment - 12/29/16 1600      Assessment   Diagnosis Left Colles Fracture     Precautions   Precautions None     Coordination   Left 9 Hole Peg Test 19.19"  23.17" previous     AROM   Left Forearm Pronation 90 Degrees  same as previous   Left Forearm Supination 85  Degrees  43 previous   Left Wrist Extension 60 Degrees  35 previous   Left Wrist Flexion 47 Degrees  25 previous   Left Wrist Radial Deviation 15 Degrees  10 previous   Left Wrist Ulnar Deviation 22 Degrees  20 previous     Strength   Left Forearm Pronation 4+/5  4/5 previous   Left Forearm Supination 4+/5  4/5 previous   Left Wrist Flexion 4+/5  4/5 previous   Left Wrist Extension 5/5  4/5 previous   Left Wrist Radial Deviation 4/5  same as previous   Left Wrist Ulnar Deviation 4/5  same as previous     Hand Function   Left Hand Grip (lbs) 35  15 previous   Left Hand Lateral Pinch 9 lbs  6 previous   Left 3 point pinch 8 lbs  6 previous                  OT Treatments/Exercises (OP) - 12/29/16 1616      Additional Wrist Exercises   Theraputty Pinch  3 point and lateral-red     Neurological Re-education Exercises   Hand Gripper with Large Beads all beads with gripper set at 29#  Hand Gripper with Medium Beads all beads with gripper set at 29#   Hand Gripper with Small Beads all beads with grippers set at 29#   Theraputty - Flatten red   Theraputty - Roll red                OT Education - 12/29/16 1619    Education provided Yes   Education Details red theraputty for continued grip and pinch strengthening   Person(s) Educated Patient   Methods Explanation;Demonstration;Handout   Comprehension Verbalized understanding;Returned demonstration          OT Short Term Goals - 12/29/16 1611      OT SHORT TERM GOAL #1   Title Patient will be educated on a HEP for left wrist and hand A/ROM for improved functional use of left arm.   Time 4   Period Weeks   Status Achieved     OT SHORT TERM GOAL #2   Title Patient will improve left forearm, wrist, and hand P/ROM to [facility] for improved ability to wash dishes.   Time 4   Period Weeks   Status Achieved     OT SHORT TERM GOAL #3   Title Patient will improve left forearm, wrist strength to 4/5  for improved ability to lift pots and pans.   Time 4   Period Weeks   Status Achieved     OT SHORT TERM GOAL #4   Title Patient will improve left grip strength by 20 pounds and pinch strength by 3 pounds for improved ability to maintain grasp on her hairdryer.   Time 4   Period Weeks   Status Achieved     OT SHORT TERM GOAL #5   Title Patient will decrease edema in her left wrist by .75 cm for improved functional use of left arm with daily tasks.   Time 4   Period Weeks   Status Not Met     OT SHORT TERM GOAL #6   Title Patient will improve left hand coordination by improving ability to complete nine hole peg test in 21 seconds or less.   Time 4   Period Weeks   Status Achieved     OT SHORT TERM GOAL #7   Title Patient will decrease left hand and wrist pain to 3/10 when completing functional activities.   Time 4   Period Weeks   Status Achieved           OT Long Term Goals - 12/29/16 1612      OT LONG TERM GOAL #1   Title Patient will return to prior level of independence with all B/IADLs, work, and leisure activities using left arm and hand actively.   Time 8   Period Weeks   Status Achieved     OT LONG TERM GOAL #2   Title Patient will improve left forearm, wrist, and hand A/ROM to WNL for improved ability to fasten bra and fix her hair.    Time 8   Period Weeks   Status Achieved     OT LONG TERM GOAL #3   Title Patient will improve left forearm and wrist strength to 5/5 in order to lift pots and pans at work and push up from a seated position.   Time 8   Period Weeks   Status Not Met     OT LONG TERM GOAL #4   Title Patient will improve left grip strength by 35 pounds and pinch strength by 6 pounds in order  to maintain grasp on pots and pans at work.     Time 8   Period Weeks   Status Not Met     OT LONG TERM GOAL #5   Title Patient will demonstrate normal fine motor coordination needed for fastening her bra by completing nine hole peg test in 20  seconds or less.    Time 8   Period Weeks   Status Achieved     OT LONG TERM GOAL #6   Title Patient will decrease edema in left wrist by 1.0 cm or greater for improved flexibility needed for ADL completion.   Time 8   Period Weeks   Status Not Met     OT LONG TERM GOAL #7   Title Patient will decrase pain in her left wrist to 1/10 or better during ADL completion.   Time 8   Status Achieved     OT LONG TERM GOAL #8   Title Patient will be able to oppose to her small finger DIPJ for improved dexterity needed for ADL completion.    Time 8   Period Weeks   Status Achieved               Plan - 2017/01/06 1647    Clinical Impression Statement A: Reassessment completed today, pt has met 6/7 STGs and 5/8 LTGs making improvements in ROM, wrist strength, grip and pinch strength, and fine motor coordination. Pt is agreeable to discharge today with HEP, has returned to work with no difficulties.    Plan P: Discharge today.    Consulted and Agree with Plan of Care Patient      Patient will benefit from skilled therapeutic intervention in order to improve the following deficits and impairments:  Decreased coordination, Decreased range of motion, Decreased strength, Increased edema, Increased fascial restricitons, Increased muscle spasms, Impaired UE functional use, Pain  Visit Diagnosis: Pain in left wrist  Other symptoms and signs involving the musculoskeletal system  Localized edema  Stiffness of left wrist, not elsewhere classified      G-Codes - 2017/01/06 1654    Functional Assessment Tool Used (Outpatient only) FOTO: 66/100 (34% impairment)   Functional Limitation Carrying, moving and handling objects   Carrying, Moving and Handling Objects Goal Status ([id number]) At least 20 percent but less than 40 percent impaired, limited or restricted   Carrying, Moving and Handling Objects Discharge Status [phone]) At least 20 percent but less than 40 percent impaired, limited or  restricted      Problem List Patient Active Problem List   Diagnosis Date Noted  . Depression 08/18/2015  . Positive fecal occult blood test 06/23/2015  . Pelvic pressure in female 01/23/2014  . Rectocele 01/23/2014   Guadelupe Sabin, OTR/L  [phone] Jan 06, 2017, 4:55 PM  [place] [address] [place], [place], [address] Phone: [phone]   Fax:  [phone]  Name: FRED HAMMES MRN: [id number] Date of Birth: 1951-08-26    OCCUPATIONAL THERAPY DISCHARGE SUMMARY  Visits from Start of Care: 9  Current functional level related to goals / functional outcomes: See above. Pt has returned to work and is using LUE as assist during all daily tasks. No pain during functional task completion, great improvements in ROM and strength.    Remaining deficits: Pt with continued deficits in grip and pinch strength.    Education / Equipment: Reviewed previously provided HEP, provided red theraputty HEP for continued grip and pinch strengthening Plan: Patient agrees to discharge.  Patient goals were met. Patient is being discharged due to meeting the stated rehab goals.  ?????

## 2017-03-16 DIAGNOSIS — I10 Essential (primary) hypertension: Secondary | ICD-10-CM | POA: Diagnosis not present

## 2017-03-16 DIAGNOSIS — Z6834 Body mass index (BMI) 34.0-34.9, adult: Secondary | ICD-10-CM | POA: Diagnosis not present

## 2017-03-16 DIAGNOSIS — E039 Hypothyroidism, unspecified: Secondary | ICD-10-CM | POA: Diagnosis not present

## 2017-03-16 DIAGNOSIS — F419 Anxiety disorder, unspecified: Secondary | ICD-10-CM | POA: Diagnosis not present

## 2017-03-16 DIAGNOSIS — G894 Chronic pain syndrome: Secondary | ICD-10-CM | POA: Diagnosis not present

## 2017-03-16 DIAGNOSIS — E782 Mixed hyperlipidemia: Secondary | ICD-10-CM | POA: Diagnosis not present

## 2017-03-16 DIAGNOSIS — Z23 Encounter for immunization: Secondary | ICD-10-CM | POA: Diagnosis not present

## 2017-03-17 ENCOUNTER — Ambulatory Visit (HOSPITAL_COMMUNITY)
Admission: RE | Admit: 2017-03-17 | Discharge: 2017-03-17 | Disposition: A | Discharge status: Home / Self Care | Payer: Medicare Other | Source: Ambulatory Visit | Attending: Family Medicine | Admitting: Family Medicine

## 2017-03-17 ENCOUNTER — Other Ambulatory Visit (HOSPITAL_COMMUNITY): Payer: Self-pay | Admitting: Family Medicine

## 2017-03-17 DIAGNOSIS — E2839 Other primary ovarian failure: Secondary | ICD-10-CM | POA: Diagnosis not present

## 2017-03-17 DIAGNOSIS — Z87891 Personal history of nicotine dependence: Secondary | ICD-10-CM

## 2017-03-17 DIAGNOSIS — M8589 Other specified disorders of bone density and structure, multiple sites: Secondary | ICD-10-CM | POA: Insufficient documentation

## 2017-03-17 DIAGNOSIS — M85851 Other specified disorders of bone density and structure, right thigh: Secondary | ICD-10-CM | POA: Diagnosis not present

## 2017-03-29 ENCOUNTER — Ambulatory Visit (HOSPITAL_COMMUNITY)
Admission: RE | Admit: 2017-03-29 | Discharge: 2017-03-29 | Disposition: A | Discharge status: Home / Self Care | Payer: Medicare Other | Source: Ambulatory Visit | Attending: Family Medicine | Admitting: Family Medicine

## 2017-03-29 DIAGNOSIS — I700 Atherosclerosis of aorta: Secondary | ICD-10-CM | POA: Insufficient documentation

## 2017-03-29 DIAGNOSIS — Z122 Encounter for screening for malignant neoplasm of respiratory organs: Secondary | ICD-10-CM | POA: Insufficient documentation

## 2017-03-29 DIAGNOSIS — I2510 Atherosclerotic heart disease of native coronary artery without angina pectoris: Secondary | ICD-10-CM | POA: Insufficient documentation

## 2017-03-29 DIAGNOSIS — M5136 Other intervertebral disc degeneration, lumbar region: Secondary | ICD-10-CM | POA: Diagnosis not present

## 2017-03-29 DIAGNOSIS — F17210 Nicotine dependence, cigarettes, uncomplicated: Secondary | ICD-10-CM | POA: Diagnosis not present

## 2017-03-29 DIAGNOSIS — J432 Centrilobular emphysema: Secondary | ICD-10-CM | POA: Diagnosis not present

## 2017-03-29 DIAGNOSIS — F17200 Nicotine dependence, unspecified, uncomplicated: Secondary | ICD-10-CM | POA: Insufficient documentation

## 2017-03-29 DIAGNOSIS — M349 Systemic sclerosis, unspecified: Secondary | ICD-10-CM | POA: Diagnosis not present

## 2017-03-29 DIAGNOSIS — Z87891 Personal history of nicotine dependence: Secondary | ICD-10-CM

## 2017-04-19 DIAGNOSIS — E663 Overweight: Secondary | ICD-10-CM | POA: Diagnosis not present

## 2017-04-19 DIAGNOSIS — M349 Systemic sclerosis, unspecified: Secondary | ICD-10-CM | POA: Diagnosis not present

## 2017-04-19 DIAGNOSIS — I7300 Raynaud's syndrome without gangrene: Secondary | ICD-10-CM | POA: Diagnosis not present

## 2017-04-19 DIAGNOSIS — R0602 Shortness of breath: Secondary | ICD-10-CM | POA: Diagnosis not present

## 2017-04-19 DIAGNOSIS — Z6834 Body mass index (BMI) 34.0-34.9, adult: Secondary | ICD-10-CM | POA: Diagnosis not present

## 2017-05-10 ENCOUNTER — Telehealth (HOSPITAL_COMMUNITY): Payer: Self-pay | Admitting: Internal Medicine

## 2017-05-10 ENCOUNTER — Telehealth (HOSPITAL_COMMUNITY): Payer: Self-pay | Admitting: *Deleted

## 2017-05-10 DIAGNOSIS — M349 Systemic sclerosis, unspecified: Secondary | ICD-10-CM

## 2017-05-10 NOTE — Telephone Encounter (Signed)
Left message for patient to call, need to give her info for appts for PFTs, Echo & new pt appt with Dr Haroldine Laws.

## 2017-05-10 NOTE — Telephone Encounter (Signed)
Received referral from Dr Trudie Reed, pt needs echo, pfts and appt w/Dr Bensimhon for scleroderma.  Orders placed will schedule

## 2017-05-12 NOTE — Telephone Encounter (Signed)
Spoke with patient, she is aware of appts for PFTs, Echo & with Dr. Haroldine Laws on 07/11/17.  New patient packet mailed w/PFT instruction sheet.

## 2017-07-11 ENCOUNTER — Telehealth (HOSPITAL_COMMUNITY): Payer: Self-pay | Admitting: Internal Medicine

## 2017-07-11 ENCOUNTER — Ambulatory Visit (HOSPITAL_BASED_OUTPATIENT_CLINIC_OR_DEPARTMENT_OTHER)
Admission: RE | Admit: 2017-07-11 | Discharge: 2017-07-11 | Disposition: A | Discharge status: Home / Self Care | Payer: Medicare Other | Source: Ambulatory Visit | Attending: Internal Medicine | Admitting: Internal Medicine

## 2017-07-11 ENCOUNTER — Ambulatory Visit (HOSPITAL_COMMUNITY)
Admission: RE | Admit: 2017-07-11 | Discharge: 2017-07-11 | Disposition: A | Discharge status: Home / Self Care | Payer: Medicare Other | Source: Ambulatory Visit | Attending: Internal Medicine | Admitting: Internal Medicine

## 2017-07-11 ENCOUNTER — Encounter (HOSPITAL_COMMUNITY): Payer: Medicare Other

## 2017-07-11 VITALS — BP 176/96 | HR 86 | Wt 204.0 lb

## 2017-07-11 DIAGNOSIS — E039 Hypothyroidism, unspecified: Secondary | ICD-10-CM | POA: Insufficient documentation

## 2017-07-11 DIAGNOSIS — Z885 Allergy status to narcotic agent status: Secondary | ICD-10-CM | POA: Diagnosis not present

## 2017-07-11 DIAGNOSIS — R0600 Dyspnea, unspecified: Secondary | ICD-10-CM

## 2017-07-11 DIAGNOSIS — Z833 Family history of diabetes mellitus: Secondary | ICD-10-CM | POA: Diagnosis not present

## 2017-07-11 DIAGNOSIS — F419 Anxiety disorder, unspecified: Secondary | ICD-10-CM | POA: Insufficient documentation

## 2017-07-11 DIAGNOSIS — M349 Systemic sclerosis, unspecified: Secondary | ICD-10-CM | POA: Insufficient documentation

## 2017-07-11 DIAGNOSIS — Z8041 Family history of malignant neoplasm of ovary: Secondary | ICD-10-CM | POA: Insufficient documentation

## 2017-07-11 DIAGNOSIS — R9389 Abnormal findings on diagnostic imaging of other specified body structures: Secondary | ICD-10-CM | POA: Diagnosis not present

## 2017-07-11 DIAGNOSIS — R0683 Snoring: Secondary | ICD-10-CM | POA: Insufficient documentation

## 2017-07-11 DIAGNOSIS — R0609 Other forms of dyspnea: Secondary | ICD-10-CM | POA: Diagnosis not present

## 2017-07-11 DIAGNOSIS — Z6834 Body mass index (BMI) 34.0-34.9, adult: Secondary | ICD-10-CM | POA: Diagnosis not present

## 2017-07-11 DIAGNOSIS — J449 Chronic obstructive pulmonary disease, unspecified: Secondary | ICD-10-CM | POA: Insufficient documentation

## 2017-07-11 DIAGNOSIS — I2584 Coronary atherosclerosis due to calcified coronary lesion: Secondary | ICD-10-CM

## 2017-07-11 DIAGNOSIS — Z7982 Long term (current) use of aspirin: Secondary | ICD-10-CM | POA: Diagnosis not present

## 2017-07-11 DIAGNOSIS — I2510 Atherosclerotic heart disease of native coronary artery without angina pectoris: Secondary | ICD-10-CM | POA: Diagnosis not present

## 2017-07-11 DIAGNOSIS — E669 Obesity, unspecified: Secondary | ICD-10-CM | POA: Insufficient documentation

## 2017-07-11 DIAGNOSIS — Z8249 Family history of ischemic heart disease and other diseases of the circulatory system: Secondary | ICD-10-CM | POA: Diagnosis not present

## 2017-07-11 DIAGNOSIS — Z79899 Other long term (current) drug therapy: Secondary | ICD-10-CM | POA: Diagnosis not present

## 2017-07-11 DIAGNOSIS — F17210 Nicotine dependence, cigarettes, uncomplicated: Secondary | ICD-10-CM | POA: Diagnosis not present

## 2017-07-11 DIAGNOSIS — I10 Essential (primary) hypertension: Secondary | ICD-10-CM | POA: Diagnosis not present

## 2017-07-11 DIAGNOSIS — Z807 Family history of other malignant neoplasms of lymphoid, hematopoietic and related tissues: Secondary | ICD-10-CM | POA: Insufficient documentation

## 2017-07-11 NOTE — Progress Notes (Signed)
  Echocardiogram 2D Echocardiogram has been performed.  Regina Berry 07/11/2017, 11:11 AM

## 2017-07-11 NOTE — Progress Notes (Signed)
PULMONARY HYPERTENSION CLINIC CONSULT NOTE  Referring Physician: Dr. Trudie Reed PCP: Dr. Hilma Favors    HPI:  Regina Berry is 66 y.o. female smoker with scleroderma who is referred by Dr. Trudie Reed for further screening in the [facility].  She has h/o tobacco use (1 ppd since 66 years old), HTN, hypothroidism, anxiety and obesity. Denies any known cardiac disease. Never had stress test. In 2009 had empyema requiring decortication.   Says she started on anti-HTN meds about 6 months. Says she has had a hard time getting around lately due to arthritis. Over past few months has had progressive SOB. Did hall walk in PCP office and sats dropped to 87%. SOB gets worse when fluid builds up Takes lasix as needed Pending PFTs for today but had to be rescheduled. No CP. Taking care of mother-in-law with emphysema. Husband says she snores a lot.   In 7/18 had CT scan: Moderate COPD. + patulous esophagus. LAD calcification .  ECHO: 60-65%   Reviewed personally.    Review of Systems: [y] = yes, [ ]  = no   General: Weight gain [ ] ; Weight loss [ ] ; Anorexia [ ] ; Fatigue [ y]; Fever [ ] ; Chills [ ] ; Weakness [ ]   Cardiac: Chest pain/pressure [ ] ; Resting SOB [ ] ; Exertional SOB Blue.Reese ]; Orthopnea [ ] ; Pedal Edema Blue.Reese ]; Palpitations [ ] ; Syncope [ ] ; Presyncope [ ] ; Paroxysmal nocturnal dyspnea[ ]   Pulmonary: Cough Blue.Reese ]; Wheezing[ ] ; Hemoptysis[ ] ; Sputum [ ] ; Snoring Blue.Reese ]  GI: Vomiting[ ] ; Dysphagia[ ] ; Melena[ ] ; Hematochezia [ ] ; Heartburn[y ]; Abdominal pain [ ] ; Constipation [ ] ; Diarrhea [ ] ; BRBPR [ ]   GU: Hematuria[ ] ; Dysuria [ ] ; Nocturia[ ]   Vascular: Pain in legs with walking [ ] ; Pain in feet with lying flat [ ] ; Non-healing sores [ ] ; Stroke [ ] ; TIA [ ] ; Slurred speech [ ] ;  Neuro: Headaches[ ] ; Vertigo[ ] ; Seizures[ ] ; Paresthesias[ ] ;Blurred vision [ ] ; Diplopia [ ] ; Vision changes [ ]   Ortho/Skin: Arthritis Blue.Reese ]; Joint pain [ y]; Muscle pain [ ] ; Joint swelling [ ] ; Back Pain Blue.Reese ]; Rash [ ]   Psych:  Depression[ ] ; Anxiety[ y]  Heme: Bleeding problems [ ] ; Clotting disorders [ ] ; Anemia [ ]   Endocrine: Diabetes [ ] ; Thyroid dysfunction[ y]   Past Medical History:  Diagnosis Date  . Anxiety   . Hypertension   . Hypothyroidism   . Pelvic pressure in female 01/23/2014  . Positive fecal occult blood test 06/23/2015  . Rectocele 01/23/2014  . 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 Thyroid disease     Current Outpatient Medications  Medication Sig Dispense Refill  . ALPRAZolam (XANAX) 0.5 MG tablet Take 0.5 mg by mouth daily as needed. anxiety    . aspirin EC 81 MG tablet Take 81 mg daily by mouth.    . calcium-vitamin D 250-100 MG-UNIT tablet Take 1 tablet daily by mouth.    . chlorpheniramine-HYDROcodone (TUSSIONEX) 10-8 MG/5ML SUER Take 5 mLs by mouth.    . citalopram (CELEXA) 40 MG tablet Take 40 mg by mouth daily.     . furosemide (LASIX) 20 MG tablet Take 20 mg by mouth daily as needed. fluid    . HYDROcodone-acetaminophen (NORCO/VICODIN) 5-325 MG tablet Take 1 tablet by mouth every 6 (six) hours as needed for moderate pain. 30 tablet 0  . hydrocortisone (ANUSOL-HC) 25 MG suppository Place 1 suppository (25 mg total) rectally at bedtime. 14 suppository 1  . levothyroxine (  SYNTHROID, LEVOTHROID) 75 MCG tablet Take 75 mcg by mouth daily.    Marland Kitchen losartan-hydrochlorothiazide (HYZAAR) 100-25 MG tablet Take 0.5 tablets daily by mouth.    . Omega-3 Fatty Acids (FISH OIL) 1000 MG CAPS Take by mouth daily.    Marland Kitchen omeprazole (PRILOSEC) 10 MG capsule Take 10 mg daily by mouth.    . zolpidem (AMBIEN) 10 MG tablet Take 5 mg by mouth at bedtime as needed. sleep     No current facility-administered medications for this encounter.     Allergies  Allergen Reactions  . Codeine Itching      Social History   Socioeconomic History  . Marital status: Married    Spouse name: Not on file  . Number of children: Not on file  . Years of education: Not on file  . Highest education level: Not on file    Social Needs  . Financial resource strain: Not on file  . Food insecurity - worry: Not on file  . Food insecurity - inability: Not on file  . Transportation needs - medical: Not on file  . Transportation needs - non-medical: Not on file  Occupational History  . Not on file  Tobacco Use  . Smoking status: Current Every Day Smoker    Packs/day: 1.00    Years: 40.00    Pack years: 40.00    Types: Cigarettes  . Smokeless tobacco: Never Used  . Tobacco comment: smoking x 40 yrs. Down to 4 cigarettes a day x 2 weeks  Substance and Sexual Activity  . Alcohol use: Yes    Alcohol/week: 0.0 oz    Comment: occasionally  . Drug use: No  . Sexual activity: No    Birth control/protection: Post-menopausal  Other Topics Concern  . Not on file  Social History Narrative  . Not on file      Family History  Problem Relation Age of Onset  . Diabetes Mother   . Cancer Mother        ovarian  . COPD Mother   . Diabetes Father   . Cancer Father        lymphoma  . Heart disease Father   . Other Daughter        transverse mylitis    Vitals:   07/11/17 1105  BP: (!) 176/96  Pulse: 86  SpO2: 91%  Weight: 204 lb (92.5 kg)    PHYSICAL EXAM: General:  Obese woman No respiratory difficulty HEENT: normal Neck: supple. JVP 5-6. Carotids 2+ bilat; no bruits. No lymphadenopathy or thryomegaly appreciated. Cor: PMI nondisplaced. Regular rate & rhythm. Distant HS.  No rubs, gallops or murmurs. Lungs: clear but decreased throughout Abdomen: soft, nontender, nondistended. No hepatosplenomegaly. No bruits or masses. Good bowel sounds. Extremities: no cyanosis, clubbing, rash, edema Neuro: alert & oriented x 3, cranial nerves grossly intact. moves all 4 extremities w/o difficulty. Affect pleasant.   ASSESSMENT & PLAN: 1. Scleroderma - Echo reviewed personally in clinic. No evidence of PAH or RV strain on echo. Recent CT with no pulmonary fibrosis - continue yearly screening  2.  Dyspnea   - multifactorial due to COPD, probable diastolic HF, obesity probable OSA. No evidence of pulmonary fibrosis or PAH at this point - encouraged to quit smoking and lose weight - may need O2 in near future  3. Snoring - refer for sleep study  4. Coronary calcium on CT - possible contributor to exertional dyspnea - proceed with Lexiscan Myoview stress testing - would start statin  5. Tobacco use - encouraged smoking cesstion  6. HTN - currently not taking BP med. Encouraged her to start taking - F/u with Dr. Joanne Chars, MD  11:51 AM

## 2017-07-11 NOTE — Patient Instructions (Signed)
Your physician has requested that you have a lexiscan myoview. For further information please visit [email or web address]. Please follow instruction sheet, as given.  Your physician has recommended that you have a sleep study. This test records several body functions during sleep, including: brain activity, eye movement, oxygen and carbon dioxide blood levels, heart rate and rhythm, breathing rate and rhythm, the flow of air through your mouth and nose, snoring, body muscle movements, and chest and belly movement.  We will contact you in 1 year to schedule your next appointment and echocardiogram

## 2017-07-12 NOTE — Telephone Encounter (Signed)
User: [name] Date/time: 07/12/17 11:16 AM  Comment: Called pt and lmsg for her to CB to get scheduled for myoview..  Context:  Outcome: Left Message  Phone number: [phone] Phone Type: Home Phone  Comm. type: Telephone Call type: Outgoing  Contact: Nena Jordan L Relation to patient: Self    User: [name] Date/time: 07/11/17 1:11 PM  Comment: Called pt and spoke with her to to sch myoview and she voiced that she would call me backonce she looked at her schedule.   Context:  Outcome: Completed  Phone number: [phone] Phone Type: Home Phone  Comm. type: Telephone Call type: Outgoing  Contact: Nena Jordan L Relation to patient: Self

## 2017-07-14 ENCOUNTER — Encounter (HOSPITAL_COMMUNITY): Payer: Medicare Other

## 2017-07-19 ENCOUNTER — Ambulatory Visit (HOSPITAL_COMMUNITY)
Admission: RE | Admit: 2017-07-19 | Discharge: 2017-07-19 | Disposition: A | Discharge status: Home / Self Care | Payer: Medicare Other | Source: Ambulatory Visit | Attending: Internal Medicine | Admitting: Internal Medicine

## 2017-07-19 ENCOUNTER — Ambulatory Visit: Payer: Medicare Other | Attending: Internal Medicine | Admitting: Cardiology

## 2017-07-19 DIAGNOSIS — G4733 Obstructive sleep apnea (adult) (pediatric): Secondary | ICD-10-CM | POA: Diagnosis not present

## 2017-07-19 DIAGNOSIS — R0683 Snoring: Secondary | ICD-10-CM | POA: Diagnosis not present

## 2017-07-19 DIAGNOSIS — G4736 Sleep related hypoventilation in conditions classified elsewhere: Secondary | ICD-10-CM | POA: Diagnosis not present

## 2017-07-19 DIAGNOSIS — M349 Systemic sclerosis, unspecified: Secondary | ICD-10-CM | POA: Diagnosis not present

## 2017-07-19 LAB — PULMONARY FUNCTION TEST
DL/VA % pred: 62 %
DL/VA: 3.01 ml/min/mmHg/L
DLCO COR % PRED: 42 %
DLCO UNC: 10.43 ml/min/mmHg
DLCO cor: 10.43 ml/min/mmHg
DLCO unc % pred: 42 %
FEF 25-75 POST: 1.03 L/s
FEF 25-75 Pre: 1 L/sec
FEF2575-%Change-Post: 2 %
FEF2575-%Pred-Post: 49 %
FEF2575-%Pred-Pre: 48 %
FEV1-%Change-Post: -1 %
FEV1-%PRED-POST: 47 %
FEV1-%PRED-PRE: 47 %
FEV1-POST: 1.13 L
FEV1-PRE: 1.14 L
FEV1FVC-%CHANGE-POST: -4 %
FEV1FVC-%Pred-Pre: 102 %
FEV6-%Change-Post: 3 %
FEV6-%PRED-PRE: 48 %
FEV6-%Pred-Post: 49 %
FEV6-PRE: 1.44 L
FEV6-Post: 1.49 L
FEV6FVC-%PRED-POST: 104 %
FEV6FVC-%PRED-PRE: 104 %
FVC-%CHANGE-POST: 3 %
FVC-%PRED-POST: 47 %
FVC-%PRED-PRE: 46 %
FVC-POST: 1.49 L
FVC-PRE: 1.44 L
POST FEV6/FVC RATIO: 100 %
PRE FEV1/FVC RATIO: 79 %
PRE FEV6/FVC RATIO: 100 %
Post FEV1/FVC ratio: 76 %
RV % PRED: 134 %
RV: 2.83 L
TLC % PRED: 85 %
TLC: 4.34 L

## 2017-07-19 MED ORDER — ALBUTEROL SULFATE (2.5 MG/3ML) 0.083% IN NEBU
2.5000 mg | INHALATION_SOLUTION | Freq: Once | RESPIRATORY_TRACT | Status: AC
  Administered 2017-07-19: 2.5 mg via RESPIRATORY_TRACT

## 2017-07-20 ENCOUNTER — Telehealth (HOSPITAL_COMMUNITY): Payer: Self-pay | Admitting: *Deleted

## 2017-07-20 NOTE — Telephone Encounter (Signed)
Left message on voicemail in reference to upcoming appointment scheduled for 07/24/17 Phone number given for a call back so details instructions can be given. Regina Berry Jacqueline   

## 2017-07-21 ENCOUNTER — Telehealth (HOSPITAL_COMMUNITY): Payer: Self-pay | Admitting: *Deleted

## 2017-07-21 NOTE — Telephone Encounter (Signed)
Patient given detailed instructions per Myocardial Perfusion Study Information Sheet for the test on 07/24/17 at 7:30. Patient notified to arrive 15 minutes early and that it is imperative to arrive on time for appointment to keep from having the test rescheduled.  If you need to cancel or reschedule your appointment, please call the office within 24 hours of your appointment. . Patient verbalized understanding.Regina Berry

## 2017-07-24 ENCOUNTER — Ambulatory Visit (HOSPITAL_COMMUNITY): Payer: Medicare Other | Attending: Cardiology

## 2017-07-24 DIAGNOSIS — I2510 Atherosclerotic heart disease of native coronary artery without angina pectoris: Secondary | ICD-10-CM | POA: Diagnosis not present

## 2017-07-24 DIAGNOSIS — I2584 Coronary atherosclerosis due to calcified coronary lesion: Secondary | ICD-10-CM | POA: Insufficient documentation

## 2017-07-24 LAB — MYOCARDIAL PERFUSION IMAGING
CHL CUP NUCLEAR SRS: 2
CSEPPHR: 85 {beats}/min
LV dias vol: 124 mL (ref 46–106)
LV sys vol: 56 mL
NUC STRESS TID: 0.95
RATE: 0.29
Rest HR: 65 {beats}/min
SDS: 1
SSS: 3

## 2017-07-24 MED ORDER — TECHNETIUM TC 99M TETROFOSMIN IV KIT
32.9000 | PACK | Freq: Once | INTRAVENOUS | Status: AC | PRN
  Administered 2017-07-24: 32.9 via INTRAVENOUS
  Filled 2017-07-24: qty 33

## 2017-07-24 MED ORDER — TECHNETIUM TC 99M TETROFOSMIN IV KIT
10.5000 | PACK | Freq: Once | INTRAVENOUS | Status: AC | PRN
  Administered 2017-07-24: 10.5 via INTRAVENOUS
  Filled 2017-07-24: qty 11

## 2017-07-24 MED ORDER — REGADENOSON 0.4 MG/5ML IV SOLN
0.4000 mg | Freq: Once | INTRAVENOUS | Status: AC
  Administered 2017-07-24: 0.4 mg via INTRAVENOUS

## 2017-08-17 NOTE — Procedures (Signed)
   Patient Name: Regina Berry, Regina Berry Date: 07/19/2017 Gender: Female D.O.B: 18-Aug-1951 Age (years): 66 Referring Provider: Shaune Pascal Bensimhon Height (inches): 64 Interpreting Physician: Fransico Him MD, ABSM Weight (lbs): 204 RPSGT: Peak, Robert BMI: 35 MRN: [id number] Neck Size: 17.00  CLINICAL INFORMATION Sleep Study Type: NPSG  Indication for sleep study: Snoring  Epworth Sleepiness Score: 9  SLEEP STUDY TECHNIQUE As per the AASM Manual for the Scoring of Sleep and Associated Events v2.3 (April 2016) with a hypopnea requiring 4% desaturations.  The channels recorded and monitored were frontal, central and occipital EEG, electrooculogram (EOG), submentalis EMG (chin), nasal and oral airflow, thoracic and abdominal wall motion, anterior tibialis EMG, snore microphone, electrocardiogram, and pulse oximetry.  MEDICATIONS Medications self-administered by patient taken the night of the study : N/A  SLEEP ARCHITECTURE The study was initiated at 10:37:43 PM and ended at 5:19:02 AM.  Sleep onset time was 111.1 minutes and the sleep efficiency was 44.8%. The total sleep time was 179.8 minutes.  Stage REM latency was N/A minutes.  The patient spent 24.76% of the night in stage N1 sleep, 75.24% in stage N2 sleep, 0.00% in stage N3 and 0.00% in REM.  Alpha intrusion was absent.  Supine sleep was 100.00%.  RESPIRATORY PARAMETERS The overall apnea/hypopnea index (AHI) was 29.4 per hour. There were 0 total apneas, including 0 obstructive, 0 central and 0 mixed apneas. There were 88 hypopneas and 20 RERAs.  The AHI during Stage REM sleep was N/A per hour.  AHI while supine was 29.4 per hour.  The mean oxygen saturation was 84.42%. The minimum SpO2 during sleep was 75.00%.  loud snoring was noted during this study.  CARDIAC DATA The 2 lead EKG demonstrated sinus rhythm. The mean heart rate was 57.04 beats per minute. Other EKG findings include: None.  LEG MOVEMENT  DATA The total PLMS were 0 with a resulting PLMS index of 0.00. Associated arousal with leg movement index was 0.0 .  IMPRESSIONS - Severe obstructive sleep apnea occurred during this study (AHI = 29.4/h). - No significant central sleep apnea occurred during this study (CAI = 0.0/h). - Severe oxygen desaturation was noted during this study (Min O2 = 75.00%). - The patient snored with loud snoring volume. - No cardiac abnormalities were noted during this study. - Clinically significant periodic limb movements did not occur during sleep. No significant associated arousals.  DIAGNOSIS - Obstructive Sleep Apnea (327.23 [G47.33 ICD-10]) - Nocturnal Hypoxemia (327.26 [G47.36 ICD-10])  RECOMMENDATIONS - Therapeutic CPAP titration to determine optimal pressure required to alleviate sleep disordered breathing. - Positional therapy avoiding supine position during sleep. - Avoid alcohol, sedatives and other CNS depressants that may worsen sleep apnea and disrupt normal sleep architecture. - Sleep hygiene should be reviewed to assess factors that may improve sleep quality. - Weight management and regular exercise should be initiated or continued if appropriate  .[place], American Board of Sleep Medicine  ELECTRONICALLY SIGNED ON:  08/17/2017, 9:41 PM [place] PH: [phone]   FX: [phone] [place]

## 2017-08-18 ENCOUNTER — Telehealth: Payer: Self-pay | Admitting: *Deleted

## 2017-08-18 ENCOUNTER — Encounter: Payer: Self-pay | Admitting: *Deleted

## 2017-08-18 DIAGNOSIS — G4733 Obstructive sleep apnea (adult) (pediatric): Secondary | ICD-10-CM

## 2017-08-18 NOTE — Telephone Encounter (Addendum)
Informed patient of sleep study results and patient understanding was verbalized. Patient understands she stopped breathing 29.4 per hour. Patient understands she has sleep apnea and Dr Radford Pax recommends a CPAP titration to treat her disordered breathing. Patient understands her Titration will be done at [facility] sleep lab in [place] Patient understands her Titration study is scheduled for Sunday September 10 2017. Patient understands she will receive a sleep packet in a week or so. Patient understands to call if she does not receive the sleep packet in a timely manner. Patient agrees with treatment and thanked me for call

## 2017-08-18 NOTE — Telephone Encounter (Signed)
-----   Message from Regina Margarita, MD sent at 08/17/2017  9:43 PM EST ----- Please let patient know that they have sleep apnea and recommend CPAP titration. Please set up titration in the sleep lab.

## 2017-08-24 ENCOUNTER — Encounter: Payer: Self-pay | Admitting: *Deleted

## 2017-08-31 ENCOUNTER — Encounter: Payer: Self-pay | Admitting: *Deleted

## 2017-09-10 ENCOUNTER — Ambulatory Visit: Payer: Medicare Other | Attending: Cardiology | Admitting: Cardiology

## 2017-09-10 DIAGNOSIS — G4733 Obstructive sleep apnea (adult) (pediatric): Secondary | ICD-10-CM | POA: Insufficient documentation

## 2017-09-10 DIAGNOSIS — G4736 Sleep related hypoventilation in conditions classified elsewhere: Secondary | ICD-10-CM | POA: Diagnosis not present

## 2017-09-10 DIAGNOSIS — G4734 Idiopathic sleep related nonobstructive alveolar hypoventilation: Secondary | ICD-10-CM

## 2017-09-11 NOTE — Procedures (Signed)
NAME: Regina Berry DATE OF BIRTH:  May 28, 1951 MEDICAL RECORD NUMBER [id number]  LOCATION: [facility]  PHYSICIAN: Traci Turner  DATE OF STUDY: 09/10/2017  SLEEP STUDY TYPE: Positive Airway Pressure Titration               REFERRING PHYSICIAN: Sueanne Margarita, MD  NECK SIZE: 16 in.  CLINICAL INFORMATION The patient is referred for a CPAP titration to treat sleep apnea.  Date of NPSG, Split Night or HST:  SLEEP STUDY TECHNIQUE As per the AASM Manual for the Scoring of Sleep and Associated Events v2.3 (April 2016) with a hypopnea requiring 4% desaturations.  The channels recorded and monitored were frontal, central and occipital EEG, electrooculogram (EOG), submentalis EMG (chin), nasal and oral airflow, thoracic and abdominal wall motion, anterior tibialis EMG, snore microphone, electrocardiogram, and pulse oximetry. Continuous positive airway pressure (CPAP) was initiated at the beginning of the study and titrated to treat sleep-disordered breathing.  MEDICATIONS Medications self-administered by patient taken the night of the study : N/A  TECHNICIAN COMMENTS Comments added by technician: CPAP therapy started at 4 cm of H20 and increased to 6 cm of H20, due to apnea-like events in later REM stage and snoring episodes. Patient tolerated CPAP very well. Sleep talking noticed throughout the night. Questionable bruxism noticed at times (???). PLMS noticed during CPAP therapy Comments added by scorer: N/A  RESPIRATORY PARAMETERS Optimal PAP Pressure (cm):6  AHI at Optimal Pressure (/hr):3.8 Overall Minimal O2 (%):83.00  Supine % at Optimal Pressure (%):N/A Minimal O2 at Optimal Pressure (%):84.00   SLEEP ARCHITECTURE The study was initiated at 10:24:22 PM and ended at 5:06:58 AM.  Sleep onset time was 45.2 minutes and the sleep efficiency was 82.1%. The total sleep time was 330.4 minutes.  The patient spent 5.30% of the night in stage N1 sleep, 62.92% in  stage N2 sleep, 14.83% in stage N3 and 16.95% in REM.Stage REM latency was 278.5 minutes  Wake after sleep onset was 27.0. Alpha intrusion was absent. Supine sleep was 78.24%.  CARDIAC DATA The 2 lead EKG demonstrated sinus rhythm. The mean heart rate was N/A beats per minute. Other EKG findings include: None.  LEG MOVEMENT DATA The total Periodic Limb Movements of Sleep (PLMS) were 87. The PLMS index was 15.80. A PLMS index of <15 is considered normal in adults.  IMPRESSIONS - An optimal PAP pressureof 6cm H2O was selected. - Central sleep apnea was not noted during this titration (CAI = 0.0/h). - Moderate oxygen desaturations were observed during this titration (min O2 = 83.00%). - The patient snored with soft snoring volume during this titration study. - No cardiac abnormalities were observed during this study. - Mild periodic limb movements were observed during this study. Arousals associated with PLMs were rare.  DIAGNOSIS - Obstructive Sleep Apnea (327.23 [G47.33 ICD-10]) - Nocturnal hypoxemia  RECOMMENDATIONS - Recommend CPAP at 6cm H2O with heated humidity and mask of choice.  - Oxygen at 2L via CPAP for continued hypoxemia. - Overnight pulse oximetry and on CPAP and O2 - Avoid alcohol, sedatives and other CNS depressants that may worsen sleep apnea and disrupt normal sleep architecture. - Sleep hygiene should be reviewed to assess factors that may improve sleep quality. - Weight management and regular exercise should be initiated or continued. - Return to Sleep Center for re-evaluation after 10 weeks of therapy  [place], [place] [facility]  ELECTRONICALLY SIGNED ON:  09/11/2017, 9:43 AM [facility] PH: [phone]   FX: [phone] [place]

## 2017-09-29 ENCOUNTER — Telehealth: Payer: Self-pay | Admitting: *Deleted

## 2017-09-29 NOTE — Telephone Encounter (Addendum)
Informed patient of titration results and verbalized understanding was indicated. Patient understands her CPAP Titration was successful but due to persistent hypoxemia (oxygen drops at night) despite CPAP she will also need oxygen at night. Patient understands Dr. Radford Pax has ordered a overnight pulse ox on CPAP and 02. Patient understands she will be contacted by [facility] to set up her cpap. She understands to call if [facility] does not contact her with new setup in a timely manner. She understands she will be called once confirmation has been received from [facility] that she has received her new machine to schedule 10 week follow up appointment.  [facility] notified of new cpap order  Please add to airview She was grateful for the call and thanked me.

## 2017-09-29 NOTE — Telephone Encounter (Signed)
-----   Message from Sueanne Margarita, MD sent at 09/11/2017  9:47 AM EST ----- Please let patient know that they had a successful PAP titration but due to persistent hypoxemia despite CPAP she will also need oxygen at night.   Please let DME know that orders are in EPIC.  Please set up 10 week OV with me.

## 2017-10-09 ENCOUNTER — Telehealth: Payer: Self-pay | Admitting: *Deleted

## 2017-10-09 NOTE — Telephone Encounter (Signed)
Patient has a 10 week follow up appointment scheduled for December 21 2017. Patient understands she needs to keep this appointment for insurance compliance. Patient was grateful for the call and thanked me.

## 2017-10-20 DIAGNOSIS — I7300 Raynaud's syndrome without gangrene: Secondary | ICD-10-CM | POA: Diagnosis not present

## 2017-10-20 DIAGNOSIS — Z6835 Body mass index (BMI) 35.0-35.9, adult: Secondary | ICD-10-CM | POA: Diagnosis not present

## 2017-10-20 DIAGNOSIS — E669 Obesity, unspecified: Secondary | ICD-10-CM | POA: Diagnosis not present

## 2017-10-20 DIAGNOSIS — M349 Systemic sclerosis, unspecified: Secondary | ICD-10-CM | POA: Diagnosis not present

## 2017-11-28 DIAGNOSIS — J22 Unspecified acute lower respiratory infection: Secondary | ICD-10-CM | POA: Diagnosis not present

## 2017-11-28 DIAGNOSIS — J209 Acute bronchitis, unspecified: Secondary | ICD-10-CM | POA: Diagnosis not present

## 2017-11-28 DIAGNOSIS — E6609 Other obesity due to excess calories: Secondary | ICD-10-CM | POA: Diagnosis not present

## 2017-11-28 DIAGNOSIS — Z1389 Encounter for screening for other disorder: Secondary | ICD-10-CM | POA: Diagnosis not present

## 2017-11-28 DIAGNOSIS — Z6834 Body mass index (BMI) 34.0-34.9, adult: Secondary | ICD-10-CM | POA: Diagnosis not present

## 2017-12-11 ENCOUNTER — Other Ambulatory Visit (HOSPITAL_COMMUNITY)
Admission: RE | Admit: 2017-12-11 | Discharge: 2017-12-11 | Disposition: A | Discharge status: Home / Self Care | Payer: Medicare Other | Source: Ambulatory Visit | Attending: Obstetrics and Gynecology | Admitting: Obstetrics and Gynecology

## 2017-12-11 ENCOUNTER — Ambulatory Visit (INDEPENDENT_AMBULATORY_CARE_PROVIDER_SITE_OTHER): Payer: Medicare Other | Admitting: Obstetrics and Gynecology

## 2017-12-11 ENCOUNTER — Encounter: Payer: Self-pay | Admitting: Obstetrics and Gynecology

## 2017-12-11 ENCOUNTER — Other Ambulatory Visit: Payer: Self-pay

## 2017-12-11 VITALS — BP 126/86 | HR 82 | Ht 64.0 in | Wt 197.0 lb

## 2017-12-11 DIAGNOSIS — N3941 Urge incontinence: Secondary | ICD-10-CM | POA: Diagnosis not present

## 2017-12-11 DIAGNOSIS — K623 Rectal prolapse: Secondary | ICD-10-CM | POA: Insufficient documentation

## 2017-12-11 DIAGNOSIS — K5900 Constipation, unspecified: Secondary | ICD-10-CM

## 2017-12-11 DIAGNOSIS — I10 Essential (primary) hypertension: Secondary | ICD-10-CM | POA: Diagnosis not present

## 2017-12-11 DIAGNOSIS — M349 Systemic sclerosis, unspecified: Secondary | ICD-10-CM | POA: Diagnosis not present

## 2017-12-11 DIAGNOSIS — Z124 Encounter for screening for malignant neoplasm of cervix: Secondary | ICD-10-CM | POA: Diagnosis not present

## 2017-12-11 DIAGNOSIS — E039 Hypothyroidism, unspecified: Secondary | ICD-10-CM | POA: Insufficient documentation

## 2017-12-11 DIAGNOSIS — F419 Anxiety disorder, unspecified: Secondary | ICD-10-CM | POA: Insufficient documentation

## 2017-12-11 NOTE — Progress Notes (Addendum)
Patient ID: [name], female   DOB: July 26, 1951, 67 y.o.   MRN: [id number]   [facility] Visit  @DATE @            Patient name: Regina Berry MRN [id number]  Date of birth: [birth year]  CC & HPI:  Regina Berry is a 67 y.o. female presenting today to discuss surgery options for a her gradually worsening prolapsed rectum. She reports normal bowel moments, except occasionally she will get constipated. When that occurs she will use suppositories for relief.  She has to splint at times to defecate.  She has not had a impaction that required digital relief the patient denies fever, chills or any other symptoms or complaints at this time.  ROS:  ROS +rectocele +constipation -fever -chills All systems are negative except as noted in the HPI and PMH.   Pertinent History Reviewed:   Reviewed: Significant for rectocele Medical         Past Medical History:  Diagnosis Date  . Anxiety   . Hypertension   . Hypothyroidism   . Pelvic pressure in female 01/23/2014  . Positive fecal occult blood test 06/23/2015  . Rectocele 01/23/2014  . 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 Thyroid disease                               Surgical Hx:    Past Surgical History:  Procedure Laterality Date  . CESAREAN SECTION    . COLONOSCOPY N/A 09/11/2015   Procedure: COLONOSCOPY;  Surgeon: Rogene Houston, MD;  Location: [facility];  Service: Endoscopy;  Laterality: N/A;  moved to 1/6 @ 1:35 - Ann to notify pt  . LUNG SURGERY     Medications: Reviewed & Updated - see associated section                       Current Outpatient Medications:  .  ALPRAZolam (XANAX) 0.5 MG tablet, Take 0.5 mg by mouth daily as needed. anxiety, Disp: , Rfl:  .  aspirin EC 81 MG tablet, Take 81 mg daily by mouth., Disp: , Rfl:  .  calcium-vitamin D 250-100 MG-UNIT tablet, Take 1 tablet daily by mouth., Disp: , Rfl:  .  citalopram (CELEXA) 40 MG tablet, Take 40 mg by mouth daily. , Disp: , Rfl:  .  furosemide (LASIX) 20  MG tablet, Take 20 mg by mouth daily as needed. fluid, Disp: , Rfl:  .  HYDROcodone-acetaminophen (NORCO/VICODIN) 5-325 MG tablet, Take 1 tablet by mouth every 6 (six) hours as needed for moderate pain., Disp: 30 tablet, Rfl: 0 .  hydrocortisone (ANUSOL-HC) 25 MG suppository, Place 1 suppository (25 mg total) rectally at bedtime., Disp: 14 suppository, Rfl: 1 .  levothyroxine (SYNTHROID, LEVOTHROID) 75 MCG tablet, Take 75 mcg by mouth daily., Disp: , Rfl:  .  losartan-hydrochlorothiazide (HYZAAR) 100-25 MG tablet, Take 0.5 tablets daily by mouth., Disp: , Rfl:  .  Omega-3 Fatty Acids (FISH OIL) 1000 MG CAPS, Take by mouth daily., Disp: , Rfl:  .  zolpidem (AMBIEN) 10 MG tablet, Take 5 mg by mouth at bedtime as needed. sleep, Disp: , Rfl:    Social History: Reviewed -  reports that she has been smoking cigarettes.  She has a 40.00 pack-year smoking history. She has never used smokeless tobacco.  Objective Findings:  Vitals: Blood pressure 126/86, pulse 82, height 5\' 4"  (1.626 m), weight  197 lb (89.4 kg).  PHYSICAL EXAMINATION General appearance - alert, well appearing, and in no distress, oriented to person, place, and time and overweight Mental status - alert, oriented to person, place, and time, normal mood, behavior, speech, dress, motor activity, and thought processes, affect appropriate to mood Chest - clear to auscultation, no wheezes, rales or rhonchi, symmetric air entry Heart - normal rate and regular rhythm Abdomen - soft, nontender, nondistended, no masses or organomegaly Breasts - breasts appear normal, no suspicious masses, no skin or nipple changes or axillary nodes  PELVIC External genitalia - normal Vagina - anterior support good Cervix - atrophic, tiny  Uterus - atrophic tiny  Adnexa - negative Rectal - normal rectal, no masses, guaiac negative stool obtained, protruding i rectocele to the level of the introitus  Assessment & Plan:   A:  1. Pap Smear 2. Grade III  Rectocele protruding introitus  3. Urge incontinence  P:  1. Posterior (Rectocele) Repair, Scheduled 4/30 2. Rx Ditropan 3. F/u 6 week Post-op  By signing my name below, I, Margit Banda, attest that this documentation has been prepared under the direction and in the presence of Jonnie Kind, MD. Electronically Signed: Margit Banda, Medical Scribe. 12/11/17. 3:16 PM.  I personally performed the services described in this documentation, which was SCRIBED in my presence. The recorded information has been reviewed and considered accurate. It has been edited as necessary during review. Jonnie Kind, MD

## 2017-12-12 DIAGNOSIS — D225 Melanocytic nevi of trunk: Secondary | ICD-10-CM | POA: Diagnosis not present

## 2017-12-12 DIAGNOSIS — L728 Other follicular cysts of the skin and subcutaneous tissue: Secondary | ICD-10-CM | POA: Diagnosis not present

## 2017-12-13 LAB — CYTOLOGY - PAP
DIAGNOSIS: NEGATIVE
HPV: NOT DETECTED

## 2017-12-21 ENCOUNTER — Ambulatory Visit (INDEPENDENT_AMBULATORY_CARE_PROVIDER_SITE_OTHER): Payer: Medicare Other | Admitting: Cardiology

## 2017-12-21 ENCOUNTER — Encounter: Payer: Self-pay | Admitting: Cardiology

## 2017-12-21 ENCOUNTER — Telehealth: Payer: Self-pay | Admitting: *Deleted

## 2017-12-21 DIAGNOSIS — Z9989 Dependence on other enabling machines and devices: Secondary | ICD-10-CM

## 2017-12-21 DIAGNOSIS — E669 Obesity, unspecified: Secondary | ICD-10-CM | POA: Diagnosis not present

## 2017-12-21 DIAGNOSIS — G4733 Obstructive sleep apnea (adult) (pediatric): Secondary | ICD-10-CM | POA: Diagnosis not present

## 2017-12-21 HISTORY — DX: Obesity, unspecified: E66.9

## 2017-12-21 HISTORY — DX: Dependence on other enabling machines and devices: Z99.89

## 2017-12-21 HISTORY — DX: Obstructive sleep apnea (adult) (pediatric): G47.33

## 2017-12-21 NOTE — Telephone Encounter (Signed)
Overnight pulse ox on CPAP and 2L of oxygen Order placed to [facility] today

## 2017-12-21 NOTE — Patient Instructions (Signed)
Medication Instructions:  Your physician recommends that you continue on your current medications as directed. Please refer to the Current Medication list given to you today.  If you need a refill on your cardiac medications, please contact your pharmacy first.  Labwork: None ordered   Testing/Procedures: None ordered   Follow-Up: Your physician wants you to follow-up in: 1 year with Dr. Turner. You will receive a reminder letter in the mail two months in advance. If you don't receive a letter, please call our office to schedule the follow-up appointment.  Any Other Special Instructions Will Be Listed Below (If Applicable).   Thank you for choosing [facility]    Rena Delenn Ahn, RN  [phone]  If you need a refill on your cardiac medications before your next appointment, please call your pharmacy.   

## 2017-12-21 NOTE — Progress Notes (Signed)
Cardiology Office Note:    Date:  12/21/2017   ID:  Regina Berry, DOB 17-May-1951, MRN [id number]  PCP:  Sharilyn Sites, MD  Cardiologist:  No primary care provider on file.    Referring MD: Sharilyn Sites, MD   Chief Complaint  Patient presents with  . Sleep Apnea    History of Present Illness:    Regina Berry is a 67 y.o. female with a hx of liver derma, tobacco use, hypertension, anxiety and obesity.  2D echo cardio gram showed no evidence of pulmonary hypertension or RV strain and CT showed no pulmonary fibrosis.  She was complaining of dyspnea on exertion when she saw Dr. Haroldine Laws and given her obesity a sleep study was ordered.  This showed severe obstructive sleep apnea with an AHI of 29.4 with oxygen saturations as low as 75%.  She underwent CPAP titration to 6 cm water pressure and 2 L of oxygen and was also added for nocturnal hypoxemia. She is doing well with her CPAP device and thinks that she has gotten used to it.  She tolerates the mask and feels the pressure is adequate.  She uses 2 different masks which are a nasal nasal mask and then a nasal pillow mask which she  changes out every other week to use.  Since going on CPAP she feels rested in the am and has no significant daytime sleepiness.  She denies any significant mouth or nasal dryness or nasal congestion.  She says that she had headaches prior to going on CPAP therapy and still has some residual headaches but thinks it is improved some.  She does not think that he snores according to what her husband has told her.     Past Medical History:  Diagnosis Date  . Anxiety   . Hypertension   . Hypothyroidism   . Obesity (BMI 30-39.9) 12/21/2017  . OSA on CPAP 12/21/2017   Severe with AHI 29.4/h and oxygen desaturations as low as 75%.  She is now on CPAP at 6 cm water pressure with 2 L oxygen due to nocturnal hypoxemia  . Pelvic pressure in female 01/23/2014  . Positive fecal occult blood test 06/23/2015  . Rectocele  01/23/2014  . 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 Thyroid disease     Past Surgical History:  Procedure Laterality Date  . CESAREAN SECTION    . COLONOSCOPY N/A 09/11/2015   Procedure: COLONOSCOPY;  Surgeon: Rogene Houston, MD;  [facility];  Service: Endoscopy;  Laterality: N/A;  moved to 1/6 @ 1:35 - Ann to notify pt  . LUNG SURGERY      Current Medications: Current Meds  Medication Sig  . ALPRAZolam (XANAX) 0.5 MG tablet Take 0.5 mg by mouth daily as needed. anxiety  . aspirin EC 81 MG tablet Take 81 mg daily by mouth.  . calcium-vitamin D 250-100 MG-UNIT tablet Take 1 tablet daily by mouth.  . citalopram (CELEXA) 40 MG tablet Take 40 mg by mouth daily.   . furosemide (LASIX) 20 MG tablet Take 20 mg by mouth daily as needed. fluid  . HYDROcodone-acetaminophen (NORCO/VICODIN) 5-325 MG tablet Take 1 tablet by mouth every 6 (six) hours as needed for moderate pain.  . hydrocortisone (ANUSOL-HC) 25 MG suppository Place 1 suppository (25 mg total) rectally at bedtime.  Marland Kitchen levothyroxine (SYNTHROID, LEVOTHROID) 75 MCG tablet Take 75 mcg by mouth daily.  Marland Kitchen losartan-hydrochlorothiazide (HYZAAR) 100-25 MG tablet Take 0.5 tablets daily by mouth.  . Omega-3 Fatty  Acids (FISH OIL) 1000 MG CAPS Take by mouth daily.  Marland Kitchen zolpidem (AMBIEN) 10 MG tablet Take 5 mg by mouth at bedtime as needed. sleep     Allergies:   Codeine   Social History   Socioeconomic History  . Marital status: Married    Spouse name: Not on file  . Number of children: Not on file  . Years of education: Not on file  . Highest education level: Not on file  Occupational History  . Not on file  Social Needs  . Financial resource strain: Not on file  . Food insecurity:    Worry: Not on file    Inability: Not on file  . Transportation needs:    Medical: Not on file    Non-medical: Not on file  Tobacco Use  . Smoking status: Current Every Day Smoker    Packs/day: 1.00    Years: 40.00    Pack years: 40.00     Types: Cigarettes  . Smokeless tobacco: Never Used  Substance and Sexual Activity  . Alcohol use: Yes    Alcohol/week: 0.0 oz    Comment: occasionally  . Drug use: No  . Sexual activity: Not Currently    Birth control/protection: Post-menopausal  Lifestyle  . Physical activity:    Days per week: Not on file    Minutes per session: Not on file  . Stress: Not on file  Relationships  . Social connections:    Talks on phone: Not on file    Gets together: Not on file    Attends religious service: Not on file    Active member of club or organization: Not on file    Attends meetings of clubs or organizations: Not on file    Relationship status: Not on file  Other Topics Concern  . Not on file  Social History Narrative  . Not on file     Family History: The patient's family history includes COPD in her mother; Cancer in her father and mother; Diabetes in her father and mother; Heart disease in her father; Other in her daughter.  ROS:   Please see the history of present illness.    ROS  All other systems reviewed and negative.   EKGs/Labs/Other Studies Reviewed:    The following studies were reviewed today: PAP download  EKG:  EKG is not ordered today.  Recent Labs: No results found for requested labs within last 8760 hours.   Recent Lipid Panel No results found for: CHOL, TRIG, HDL, CHOLHDL, VLDL, LDLCALC, LDLDIRECT  Physical Exam:    VS:  Ht 5\' 4"  (1.626 m)   BMI 33.81 kg/m     Wt Readings from Last 3 Encounters:  12/11/17 197 lb (89.4 kg)  07/24/17 204 lb (92.5 kg)  07/11/17 204 lb (92.5 kg)     GEN:  Well nourished, well developed in no acute distress HEENT: Normal NECK: No JVD; No carotid bruits LYMPHATICS: No lymphadenopathy CARDIAC: RRR, no murmurs, rubs, gallops RESPIRATORY:  Clear to auscultation without rales, wheezing or rhonchi  ABDOMEN: Soft, non-tender, non-distended MUSCULOSKELETAL:  No edema; No deformity  SKIN: Warm and dry NEUROLOGIC:   Alert and oriented x 3 PSYCHIATRIC:  Normal affect   ASSESSMENT:    1. OSA on CPAP   2. Obesity (BMI 30-39.9)    PLAN:    In order of problems listed above:  1.  OSA - the patient is tolerating PAP therapy well without any problems. The PAP download was reviewed today  and showed an AHI of 1.1/hr on 6 cm H2O with 77% compliance in using more than 4 hours nightly.  The patient has been using and benefiting from PAP use and will continue to benefit from therapy.  I am going to get an overnight pulse oximetry on her CPAP and 2 L of oxygen to make sure that her nocturnal hypoxemia is adequately treated.  Encouraged her to try to avoid sleeping on her back and sleep more on her side.  2.  Obesity - I have encouraged her to get into a routine exercise program and cut back on carbs and portions.      Medication Adjustments/Labs and Tests Ordered: Current medicines are reviewed at length with the patient today.  Concerns regarding medicines are outlined above.  No orders of the defined types were placed in this encounter.  No orders of the defined types were placed in this encounter.   Signed, Fransico Him, MD  12/21/2017 10:58 AM    [place]

## 2017-12-21 NOTE — Telephone Encounter (Signed)
-----   Message from Stark, RN sent at 12/21/2017  1:05 PM EDT ----- Regarding: dme order dme order placed   Thanks  Rena

## 2017-12-25 DIAGNOSIS — J0100 Acute maxillary sinusitis, unspecified: Secondary | ICD-10-CM | POA: Diagnosis not present

## 2017-12-25 DIAGNOSIS — Z1389 Encounter for screening for other disorder: Secondary | ICD-10-CM | POA: Diagnosis not present

## 2017-12-25 DIAGNOSIS — Z6834 Body mass index (BMI) 34.0-34.9, adult: Secondary | ICD-10-CM | POA: Diagnosis not present

## 2017-12-25 DIAGNOSIS — E6609 Other obesity due to excess calories: Secondary | ICD-10-CM | POA: Diagnosis not present

## 2017-12-25 DIAGNOSIS — J069 Acute upper respiratory infection, unspecified: Secondary | ICD-10-CM | POA: Diagnosis not present

## 2017-12-26 ENCOUNTER — Other Ambulatory Visit: Payer: Self-pay | Admitting: Obstetrics and Gynecology

## 2017-12-26 NOTE — Patient Instructions (Signed)
Regina Berry  12/26/2017     @PREFPERIOPPHARMACY @   Your procedure is scheduled on 01/02/2018.  Report to [facility] at 6:50 A.M.  Call this number if you have problems the morning of surgery:  [phone]   Remember:  Do not eat food or drink liquids after midnight.  Take these medicines the morning of surgery with A SIP OF WATER Xanax, Celexa, Hydrocodone, Synthroid   Do not wear jewelry, make-up or nail polish.  Do not wear lotions, powders, or perfumes, or deodorant.  Do not shave 48 hours prior to surgery.  Men may shave face and neck.  Do not bring valuables to the hospital.  [facility] is not responsible for any belongings or valuables.  Contacts, dentures or bridgework may not be worn into surgery.  Leave your suitcase in the car.  After surgery it may be brought to your room.  For patients admitted to the hospital, discharge time will be determined by your treatment team.  Patients discharged the day of surgery will not be allowed to drive home.    Please read over the following fact sheets that you were given. Surgical Site Infection Prevention and Anesthesia Post-op Instructions     PATIENT INSTRUCTIONS POST-ANESTHESIA  IMMEDIATELY FOLLOWING SURGERY:  Do not drive or operate machinery for the first twenty four hours after surgery.  Do not make any important decisions for twenty four hours after surgery or while taking narcotic pain medications or sedatives.  If you develop intractable nausea and vomiting or a severe headache please notify your doctor immediately.  FOLLOW-UP:  Please make an appointment with your surgeon as instructed. You do not need to follow up with anesthesia unless specifically instructed to do so.  WOUND CARE INSTRUCTIONS (if applicable):  Keep a dry clean dressing on the anesthesia/puncture wound site if there is drainage.  Once the wound has quit draining you may leave it open to air.  Generally you should leave the bandage intact for  twenty four hours unless there is drainage.  If the epidural site drains for more than 36-48 hours please call the anesthesia department.  QUESTIONS?:  Please feel free to call your physician or the hospital operator if you have any questions, and they will be happy to assist you.      About Rectocele  Overview  A rectocele is a type of hernia which causes different degrees of bulging of the rectal tissues into the vaginal wall.  You may even notice that it presses against the vaginal wall so much that some vaginal tissues droop outside of the opening of your vagina.  Causes of Rectocele  The most common cause is childbirth.  The muscles and ligaments in the pelvis that hold up and support the female organs and vagina become stretched and weakened during labor and delivery.  The more babies you have, the more the support tissues are stretched and weakened.  Not everyone who has a baby will develop a rectocele.  Some women have stronger supporting tissue in the pelvis and may not have as much of a problem as others.  Women who have a Cesarean section usually do not get rectocele's unless they pushed a long time prior to the cesarean delivery.  Other conditions that can cause a rectocele include chronic constipation, a chronic cough, a lot of heavy lifting, and obesity.  Older women may have this problem because the loss of female hormones causes the vaginal tissue to become weaker.  Symptoms  There may not be any symptoms.  If you do have symptoms, they may include:  Pelvic pressure in the rectal area  Protrusion of the lower part of the vagina through the opening of the vagina  Constipation and trapping of the stool, making it difficult to have a bowel movement.  In severe cases, you may have to press on the lower part of your vagina to help push the stool out of you rectum.  This is called splinting to empty.  Diagnosing Rectocele  Your health care provider will ask about your symptoms  and perform a pelvic exam.  S/he will ask you to bear down, pushing like you are having a bowel movement so as to see how far the lower part of the vagina protrudes into the vagina and possible outside of the vagina.  Your provider will also ask you to contract the muscles of your pelvis (like you are stopping the stream in the middle of urinating) to determine the strength of your pelvic muscles.  Your provider may also do a rectal exam.  Treatment Options  If you do not have any symptoms, no treatment may be necessary.  Other treatment options include:  Pelvic floor exercises: Contracting the muscles in your genital area may help strengthen your muscles and support the organs.  Be sure to get proper exercise instruction from you physical therapist.  A pessary (removealbe pelvic support device) sometimes helps rectocele symptoms.  Surgery: Surgical repair may be necessary. In some cases the uterus may need to be taken out ( a hysterectomy) as well.  There are many types of surgery for pelvic support problems.  Look for physicians who specialize in repair procedures.  You can take care of yourself by:  Treating and preventing constipation  Avoiding heavy lifting, and lifting correctly (with your legs, not with you waist or back)  Treating a chronic cough or bronchitis  Not smoking  avoiding too much weight gain  Doing pelvic floor exercises   2007, [employer] Doc.33

## 2017-12-27 ENCOUNTER — Other Ambulatory Visit: Payer: Self-pay

## 2017-12-27 ENCOUNTER — Encounter (HOSPITAL_COMMUNITY)
Admission: RE | Admit: 2017-12-27 | Discharge: 2017-12-27 | Disposition: A | Discharge status: Home / Self Care | Payer: Medicare Other | Source: Ambulatory Visit | Attending: Obstetrics and Gynecology | Admitting: Obstetrics and Gynecology

## 2017-12-27 ENCOUNTER — Encounter (HOSPITAL_COMMUNITY): Payer: Self-pay

## 2017-12-27 DIAGNOSIS — Z01818 Encounter for other preprocedural examination: Secondary | ICD-10-CM | POA: Diagnosis not present

## 2017-12-27 DIAGNOSIS — Z0183 Encounter for blood typing: Secondary | ICD-10-CM | POA: Diagnosis not present

## 2017-12-27 DIAGNOSIS — Z01812 Encounter for preprocedural laboratory examination: Secondary | ICD-10-CM | POA: Diagnosis not present

## 2017-12-27 DIAGNOSIS — J069 Acute upper respiratory infection, unspecified: Secondary | ICD-10-CM | POA: Diagnosis not present

## 2017-12-27 HISTORY — DX: Unspecified chronic bronchitis: J42

## 2017-12-27 HISTORY — DX: Gastro-esophageal reflux disease without esophagitis: K21.9

## 2017-12-27 HISTORY — DX: Anemia, unspecified: D64.9

## 2017-12-27 LAB — CBC
HCT: 41.4 % (ref 36.0–46.0)
Hemoglobin: 13.3 g/dL (ref 12.0–15.0)
MCH: 29.6 pg (ref 26.0–34.0)
MCHC: 32.1 g/dL (ref 30.0–36.0)
MCV: 92 fL (ref 78.0–100.0)
PLATELETS: 218 10*3/uL (ref 150–400)
RBC: 4.5 MIL/uL (ref 3.87–5.11)
RDW: 14.4 % (ref 11.5–15.5)
WBC: 10 10*3/uL (ref 4.0–10.5)

## 2017-12-27 LAB — COMPREHENSIVE METABOLIC PANEL
ALT: 19 U/L (ref 14–54)
AST: 35 U/L (ref 15–41)
Albumin: 3.8 g/dL (ref 3.5–5.0)
Alkaline Phosphatase: 58 U/L (ref 38–126)
Anion gap: 11 (ref 5–15)
BUN: 18 mg/dL (ref 6–20)
CHLORIDE: 96 mmol/L — AB (ref 101–111)
CO2: 30 mmol/L (ref 22–32)
CREATININE: 0.92 mg/dL (ref 0.44–1.00)
Calcium: 9.5 mg/dL (ref 8.9–10.3)
Glucose, Bld: 106 mg/dL — ABNORMAL HIGH (ref 65–99)
POTASSIUM: 3.3 mmol/L — AB (ref 3.5–5.1)
SODIUM: 137 mmol/L (ref 135–145)
Total Bilirubin: 0.7 mg/dL (ref 0.3–1.2)
Total Protein: 7 g/dL (ref 6.5–8.1)

## 2017-12-29 ENCOUNTER — Other Ambulatory Visit (HOSPITAL_COMMUNITY): Payer: Self-pay | Admitting: Family Medicine

## 2017-12-29 ENCOUNTER — Ambulatory Visit (HOSPITAL_COMMUNITY)
Admission: RE | Admit: 2017-12-29 | Discharge: 2017-12-29 | Disposition: A | Discharge status: Home / Self Care | Payer: Medicare Other | Source: Ambulatory Visit | Attending: Family Medicine | Admitting: Family Medicine

## 2017-12-29 DIAGNOSIS — Z01818 Encounter for other preprocedural examination: Secondary | ICD-10-CM | POA: Diagnosis not present

## 2017-12-29 DIAGNOSIS — R05 Cough: Secondary | ICD-10-CM | POA: Diagnosis not present

## 2017-12-29 DIAGNOSIS — I10 Essential (primary) hypertension: Secondary | ICD-10-CM | POA: Diagnosis not present

## 2017-12-29 DIAGNOSIS — Z0183 Encounter for blood typing: Secondary | ICD-10-CM | POA: Diagnosis not present

## 2017-12-29 DIAGNOSIS — Z01812 Encounter for preprocedural laboratory examination: Secondary | ICD-10-CM | POA: Diagnosis not present

## 2017-12-29 DIAGNOSIS — E782 Mixed hyperlipidemia: Secondary | ICD-10-CM | POA: Diagnosis not present

## 2017-12-29 DIAGNOSIS — E6609 Other obesity due to excess calories: Secondary | ICD-10-CM | POA: Diagnosis not present

## 2017-12-29 DIAGNOSIS — J069 Acute upper respiratory infection, unspecified: Secondary | ICD-10-CM

## 2017-12-29 DIAGNOSIS — Z6834 Body mass index (BMI) 34.0-34.9, adult: Secondary | ICD-10-CM | POA: Diagnosis not present

## 2017-12-29 DIAGNOSIS — M349 Systemic sclerosis, unspecified: Secondary | ICD-10-CM | POA: Diagnosis not present

## 2017-12-29 DIAGNOSIS — Z0001 Encounter for general adult medical examination with abnormal findings: Secondary | ICD-10-CM | POA: Diagnosis not present

## 2017-12-29 DIAGNOSIS — E876 Hypokalemia: Secondary | ICD-10-CM | POA: Diagnosis not present

## 2017-12-29 DIAGNOSIS — G894 Chronic pain syndrome: Secondary | ICD-10-CM | POA: Diagnosis not present

## 2018-01-01 DIAGNOSIS — Z0001 Encounter for general adult medical examination with abnormal findings: Secondary | ICD-10-CM | POA: Diagnosis not present

## 2018-01-01 DIAGNOSIS — Z1389 Encounter for screening for other disorder: Secondary | ICD-10-CM | POA: Diagnosis not present

## 2018-01-01 DIAGNOSIS — E876 Hypokalemia: Secondary | ICD-10-CM | POA: Diagnosis not present

## 2018-01-01 DIAGNOSIS — R946 Abnormal results of thyroid function studies: Secondary | ICD-10-CM | POA: Diagnosis not present

## 2018-01-02 ENCOUNTER — Encounter (HOSPITAL_COMMUNITY)
Admission: RE | Disposition: A | Discharge status: Home / Self Care | Payer: Self-pay | Source: Ambulatory Visit | Attending: Obstetrics and Gynecology

## 2018-01-02 ENCOUNTER — Encounter (HOSPITAL_COMMUNITY): Payer: Self-pay | Admitting: *Deleted

## 2018-01-02 ENCOUNTER — Ambulatory Visit (HOSPITAL_COMMUNITY)
Admission: RE | Admit: 2018-01-02 | Discharge: 2018-01-02 | Disposition: A | Discharge status: Home / Self Care | Payer: Medicare Other | Source: Ambulatory Visit | Attending: Obstetrics and Gynecology | Admitting: Obstetrics and Gynecology

## 2018-01-02 ENCOUNTER — Ambulatory Visit (HOSPITAL_COMMUNITY): Payer: Medicare Other | Admitting: Anesthesiology

## 2018-01-02 DIAGNOSIS — Z791 Long term (current) use of non-steroidal anti-inflammatories (NSAID): Secondary | ICD-10-CM | POA: Diagnosis not present

## 2018-01-02 DIAGNOSIS — F17210 Nicotine dependence, cigarettes, uncomplicated: Secondary | ICD-10-CM | POA: Insufficient documentation

## 2018-01-02 DIAGNOSIS — N816 Rectocele: Secondary | ICD-10-CM | POA: Diagnosis present

## 2018-01-02 DIAGNOSIS — Z79899 Other long term (current) drug therapy: Secondary | ICD-10-CM | POA: Insufficient documentation

## 2018-01-02 DIAGNOSIS — Z79890 Hormone replacement therapy (postmenopausal): Secondary | ICD-10-CM | POA: Insufficient documentation

## 2018-01-02 DIAGNOSIS — E663 Overweight: Secondary | ICD-10-CM | POA: Insufficient documentation

## 2018-01-02 DIAGNOSIS — K623 Rectal prolapse: Secondary | ICD-10-CM | POA: Diagnosis not present

## 2018-01-02 DIAGNOSIS — N3941 Urge incontinence: Secondary | ICD-10-CM | POA: Diagnosis not present

## 2018-01-02 DIAGNOSIS — I10 Essential (primary) hypertension: Secondary | ICD-10-CM | POA: Insufficient documentation

## 2018-01-02 DIAGNOSIS — N815 Vaginal enterocele: Secondary | ICD-10-CM | POA: Diagnosis not present

## 2018-01-02 DIAGNOSIS — F419 Anxiety disorder, unspecified: Secondary | ICD-10-CM | POA: Insufficient documentation

## 2018-01-02 DIAGNOSIS — Z7982 Long term (current) use of aspirin: Secondary | ICD-10-CM | POA: Insufficient documentation

## 2018-01-02 DIAGNOSIS — E039 Hypothyroidism, unspecified: Secondary | ICD-10-CM | POA: Diagnosis not present

## 2018-01-02 DIAGNOSIS — F17200 Nicotine dependence, unspecified, uncomplicated: Secondary | ICD-10-CM | POA: Diagnosis not present

## 2018-01-02 DIAGNOSIS — K469 Unspecified abdominal hernia without obstruction or gangrene: Secondary | ICD-10-CM | POA: Diagnosis present

## 2018-01-02 DIAGNOSIS — M349 Systemic sclerosis, unspecified: Secondary | ICD-10-CM | POA: Diagnosis not present

## 2018-01-02 HISTORY — PX: RECTOCELE REPAIR: SHX761

## 2018-01-02 LAB — POCT I-STAT 4, (NA,K, GLUC, HGB,HCT)
Glucose, Bld: 78 mg/dL (ref 65–99)
HCT: 42 % (ref 36.0–46.0)
HEMOGLOBIN: 14.3 g/dL (ref 12.0–15.0)
Potassium: 5.4 mmol/L — ABNORMAL HIGH (ref 3.5–5.1)
SODIUM: 139 mmol/L (ref 135–145)

## 2018-01-02 LAB — TYPE AND SCREEN
ABO/RH(D): B POS
ANTIBODY SCREEN: NEGATIVE

## 2018-01-02 SURGERY — COLPORRHAPHY, POSTERIOR, FOR RECTOCELE REPAIR
Anesthesia: General | Site: Vagina

## 2018-01-02 MED ORDER — LACTATED RINGERS IV SOLN
INTRAVENOUS | Status: DC
  Administered 2018-01-02: 09:00:00 via INTRAVENOUS
  Administered 2018-01-02: 1000 mL via INTRAVENOUS

## 2018-01-02 MED ORDER — LIDOCAINE HCL (PF) 1 % IJ SOLN
INTRAMUSCULAR | Status: AC
  Filled 2018-01-02: qty 10

## 2018-01-02 MED ORDER — ONDANSETRON HCL 4 MG/2ML IJ SOLN
INTRAMUSCULAR | Status: DC | PRN
  Administered 2018-01-02: 4 mg via INTRAVENOUS

## 2018-01-02 MED ORDER — BUPIVACAINE-EPINEPHRINE 0.5% -1:200000 IJ SOLN
INTRAMUSCULAR | Status: DC | PRN
  Administered 2018-01-02: 18 mL

## 2018-01-02 MED ORDER — GLYCOPYRROLATE 0.2 MG/ML IJ SOLN
INTRAMUSCULAR | Status: DC | PRN
  Administered 2018-01-02: .5 mg via INTRAVENOUS

## 2018-01-02 MED ORDER — EPHEDRINE SULFATE 50 MG/ML IJ SOLN
INTRAMUSCULAR | Status: DC | PRN
Start: 2018-01-02 — End: 2018-01-02
  Administered 2018-01-02: 10 mg via INTRAVENOUS

## 2018-01-02 MED ORDER — DEXAMETHASONE SODIUM PHOSPHATE 4 MG/ML IJ SOLN
INTRAMUSCULAR | Status: AC
  Filled 2018-01-02: qty 1

## 2018-01-02 MED ORDER — GLYCOPYRROLATE 0.2 MG/ML IJ SOLN
INTRAMUSCULAR | Status: AC
  Filled 2018-01-02: qty 4

## 2018-01-02 MED ORDER — BUPIVACAINE-EPINEPHRINE (PF) 0.5% -1:200000 IJ SOLN
INTRAMUSCULAR | Status: AC
  Filled 2018-01-02: qty 30

## 2018-01-02 MED ORDER — HYDROCODONE-ACETAMINOPHEN 5-325 MG PO TABS: 1.0000 | ORAL_TABLET | Freq: Four times a day (QID) | ORAL | 0 refills | Status: DC | PRN

## 2018-01-02 MED ORDER — PROPOFOL 10 MG/ML IV BOLUS
INTRAVENOUS | Status: DC | PRN
  Administered 2018-01-02: 150 mg via INTRAVENOUS

## 2018-01-02 MED ORDER — POLYETHYLENE GLYCOL 3350 17 GM/SCOOP PO POWD: 17.0000 g | Freq: Every day | ORAL | 99 refills | Status: DC

## 2018-01-02 MED ORDER — MIDAZOLAM HCL 2 MG/2ML IJ SOLN: 0.5000 mg | Freq: Once | INTRAMUSCULAR | Status: DC | PRN

## 2018-01-02 MED ORDER — NEOSTIGMINE METHYLSULFATE 10 MG/10ML IV SOLN
INTRAVENOUS | Status: DC | PRN
  Administered 2018-01-02: 3 mg via INTRAVENOUS

## 2018-01-02 MED ORDER — CEFAZOLIN SODIUM-DEXTROSE 2-4 GM/100ML-% IV SOLN
INTRAVENOUS | Status: AC
  Filled 2018-01-02: qty 100

## 2018-01-02 MED ORDER — LIDOCAINE HCL (CARDIAC) PF 50 MG/5ML IV SOSY
PREFILLED_SYRINGE | INTRAVENOUS | Status: DC | PRN
  Administered 2018-01-02: 40 mg via INTRAVENOUS

## 2018-01-02 MED ORDER — SODIUM CHLORIDE 0.9 % IR SOLN
Status: DC | PRN
  Administered 2018-01-02: 1000 mL

## 2018-01-02 MED ORDER — ACETAMINOPHEN 10 MG/ML IV SOLN: 1000.0000 mg | Freq: Once | INTRAVENOUS | Status: DC | PRN

## 2018-01-02 MED ORDER — METOPROLOL TARTRATE 5 MG/5ML IV SOLN
INTRAVENOUS | Status: AC
  Filled 2018-01-02: qty 5

## 2018-01-02 MED ORDER — SUCCINYLCHOLINE CHLORIDE 20 MG/ML IJ SOLN
INTRAMUSCULAR | Status: AC
Start: 2018-01-02 — End: ?
  Filled 2018-01-02: qty 2

## 2018-01-02 MED ORDER — CEFAZOLIN SODIUM-DEXTROSE 2-4 GM/100ML-% IV SOLN
2.0000 g | INTRAVENOUS | Status: AC
  Administered 2018-01-02: 2 g via INTRAVENOUS

## 2018-01-02 MED ORDER — SUGAMMADEX SODIUM 200 MG/2ML IV SOLN
INTRAVENOUS | Status: AC
  Filled 2018-01-02: qty 2

## 2018-01-02 MED ORDER — SODIUM CHLORIDE 0.9 % IJ SOLN
INTRAMUSCULAR | Status: AC
  Filled 2018-01-02: qty 20

## 2018-01-02 MED ORDER — PROMETHAZINE HCL 25 MG/ML IJ SOLN: 6.2500 mg | INTRAMUSCULAR | Status: DC | PRN

## 2018-01-02 MED ORDER — NEOSTIGMINE METHYLSULFATE 10 MG/10ML IV SOLN
INTRAVENOUS | Status: AC
  Filled 2018-01-02: qty 1

## 2018-01-02 MED ORDER — FENTANYL CITRATE (PF) 250 MCG/5ML IJ SOLN
INTRAMUSCULAR | Status: AC
Start: 2018-01-02 — End: ?
  Filled 2018-01-02: qty 5

## 2018-01-02 MED ORDER — ROCURONIUM BROMIDE 50 MG/5ML IV SOLN
INTRAVENOUS | Status: AC
  Filled 2018-01-02: qty 1

## 2018-01-02 MED ORDER — ONDANSETRON HCL 4 MG/2ML IJ SOLN
INTRAMUSCULAR | Status: AC
  Filled 2018-01-02: qty 2

## 2018-01-02 MED ORDER — SUCCINYLCHOLINE 20MG/ML (10ML) SYRINGE FOR MEDFUSION PUMP - OPTIME
INTRAMUSCULAR | Status: DC | PRN
  Administered 2018-01-02: 100 mg via INTRAVENOUS

## 2018-01-02 MED ORDER — MIDAZOLAM HCL 5 MG/5ML IJ SOLN
INTRAMUSCULAR | Status: DC | PRN
  Administered 2018-01-02: 2 mg via INTRAVENOUS

## 2018-01-02 MED ORDER — MIDAZOLAM HCL 2 MG/2ML IJ SOLN
INTRAMUSCULAR | Status: AC
  Filled 2018-01-02: qty 2

## 2018-01-02 MED ORDER — FENTANYL CITRATE (PF) 100 MCG/2ML IJ SOLN
INTRAMUSCULAR | Status: DC | PRN
  Administered 2018-01-02 (×3): 50 ug via INTRAVENOUS

## 2018-01-02 MED ORDER — EPHEDRINE SULFATE 50 MG/ML IJ SOLN
INTRAMUSCULAR | Status: AC
  Filled 2018-01-02: qty 2

## 2018-01-02 MED ORDER — ROCURONIUM 10MG/ML (10ML) SYRINGE FOR MEDFUSION PUMP - OPTIME
INTRAVENOUS | Status: DC | PRN
  Administered 2018-01-02: 25 mg via INTRAVENOUS
  Administered 2018-01-02: 5 mg via INTRAVENOUS

## 2018-01-02 SURGICAL SUPPLY — 29 items
BAG HAMPER (MISCELLANEOUS) ×2 IMPLANT
CLOTH BEACON ORANGE TIMEOUT ST (SAFETY) ×2 IMPLANT
COVER LIGHT HANDLE STERIS (MISCELLANEOUS) ×4 IMPLANT
DECANTER SPIKE VIAL GLASS SM (MISCELLANEOUS) ×2 IMPLANT
DRAPE HALF SHEET 40X57 (DRAPES) ×2 IMPLANT
DRAPE PROXIMA HALF (DRAPES) ×2 IMPLANT
DRAPE STERI URO 9X17 APER PCH (DRAPES) ×2 IMPLANT
ELECT REM PT RETURN 9FT ADLT (ELECTROSURGICAL) ×2
ELECTRODE REM PT RTRN 9FT ADLT (ELECTROSURGICAL) ×1 IMPLANT
GAUZE PACKING 2X5 YD STRL (GAUZE/BANDAGES/DRESSINGS) ×2 IMPLANT
GLOVE BIOGEL PI IND STRL 7.0 (GLOVE) ×2 IMPLANT
GLOVE BIOGEL PI IND STRL 9 (GLOVE) ×1 IMPLANT
GLOVE BIOGEL PI INDICATOR 7.0 (GLOVE) ×2
GLOVE BIOGEL PI INDICATOR 9 (GLOVE) ×1
GLOVE ECLIPSE 9.0 STRL (GLOVE) ×4 IMPLANT
GLOVE SS BIOGEL STRL SZ 6.5 (GLOVE) ×3 IMPLANT
GLOVE SUPERSENSE BIOGEL SZ 6.5 (GLOVE) ×3
GOWN SPEC L3 XXLG W/TWL (GOWN DISPOSABLE) ×2 IMPLANT
GOWN STRL REUS W/TWL LRG LVL3 (GOWN DISPOSABLE) ×2 IMPLANT
KIT ROOM TURNOVER AP CYSTO (KITS) ×2 IMPLANT
MANIFOLD NEPTUNE II (INSTRUMENTS) ×2 IMPLANT
NEEDLE HYPO 25X1 1.5 SAFETY (NEEDLE) ×2 IMPLANT
NS IRRIG 1000ML POUR BTL (IV SOLUTION) ×2 IMPLANT
PACK PERI GYN (CUSTOM PROCEDURE TRAY) ×2 IMPLANT
PAD ARMBOARD 7.5X6 YLW CONV (MISCELLANEOUS) ×2 IMPLANT
SET BASIN LINEN APH (SET/KITS/TRAYS/PACK) ×2 IMPLANT
SUT CHROMIC 2 0 CT 1 (SUTURE) ×2 IMPLANT
SUT VIC AB 0 CT2 8-18 (SUTURE) ×2 IMPLANT
SYR CONTROL 10ML LL (SYRINGE) ×2 IMPLANT

## 2018-01-02 NOTE — Op Note (Signed)
01/02/2018  9:01 AM  PATIENT:  Regina Berry  67 y.o. female  PRE-OPERATIVE DIAGNOSIS:  Rectocele  Urge Incontinence  POST-OPERATIVE DIAGNOSIS:  Rectocele  Enterocele  PROCEDURE:  Procedure(s): POSTERIOR REPAIR (RECTOCELE) (N/A)  SURGEON:  Surgeon(s) and Role:    Jonnie Kind, MD - Primary  PHYSICIAN ASSISTANT:   ASSISTANTS: [place] RNFA   ANESTHESIA: local , general  BLOOD ADMINISTERED:none  DRAINS: none   LOCAL MEDICATIONS USED:  MARCAINE    and Amount: 18 ml  SPECIMEN:  No Specimen  DISPOSITION OF SPECIMEN:  N/A  COUNTS:  YES  TOURNIQUET:  * No tourniquets in log *  DICTATION: .Dragon Dictation  PLAN OF CARE: Discharge to home after PACU  PATIENT DISPOSITION:  PACU - hemodynamically stable.   Delay start of Pharmacological VTE agent (>24hrs) due to surgical blood loss or risk of bleeding: not applicable Details of procedure: Patient was taken the operating room prepped and draped for general anesthesia with legs supported in candycane stirrups.  Timeout was conducted Ancef was administered.  The perineum was infiltrated with Marcaine x8 cc, and a midline cut made in the old episiotomy repair site and the vaginal epithelium was opened approximately 6 cm of the posterior vaginal wall and the vaginal epithelium was dissected free from the underlying connective tissue.  There was a large bulge of fatty tissue coming from behind the uterus between the uterosacral ligaments consistent with enterocele. After adequate elevation of the vaginal epithelium to each side approximately 2 cm for the entire length of the dissection, we were able to place a double globe right index finger beneath the vaginal bib and into the rectum and delineate the margins of the rectum.  There was an asymmetric defect in the perineal body support.  The tissues were thicker on the patient left and can be pulled across the midline and cephalad and appropriate tissue identified on the right  lateral sidewall and this connective tissue was pulled across and cephalad sufficiently to rebuild the perineal support.  With new gloves, we proceeded with using Allis clamps to pull the tissue across the midline and and reposition it.  3 interrupted sutures resulted in significantly improved perineal body thickness and then the enterocele was addressed.  The upper aspects of the newly developed rectovaginal septum tissues were sutured to the backside of the vaginal epithelium in the posterior cul-de-sac in a pursestring fashion resulting in improved support and the fatty connective tissue of the enterocele was no longer visible.  No tissue was removed during this process, was just placed and pushed elevated cephalad. Vaginal diameter was inspected and 2 fingers could be placed side-by-side in the vagina so vaginal diameter was considered adequate The remainder of the perineal body was rebuilt by placing 3 additional horizontal mattress sutures of 0 Vicryl in the lower perineal body rebuilding tissue resulting in a smooth posterior vaginal surface.  The vaginal epithelium was trimmed slightly, approximately 1 cm tissue from each side and then the vaginal epithelium closed with a running 2-0 chromic subcuticular closure, closing the potential space beneath the epithelium.  Hemostasis was quite good and was considered that the patient will be a candidate for discharge from recovery.  Sponge and needle counts correct

## 2018-01-02 NOTE — Op Note (Signed)
Please see the brief operative note for surgical details 

## 2018-01-02 NOTE — H&P (Signed)
2. Margit Banda at 12/11/2017 3:52 PM - Shared     Patient ID: Regina Berry, female   DOB: 1950/10/01, 66 y.o.   MRN: [id number]   [facility] Visit  @DATE @            Patient name: Regina Berry    MRN [id number]  Date of birth: Jan 30, 1951  CC & HPI:  Regina Berry is a 68 y.o. female presenting today to discuss surgery options for a her gradually worsening prolapsed rectum. She reports normal bowel moments, except occasionally she will get constipated. When that occurs she will use suppositories for relief.  She has to splint at times to defecate.  She has not had a impaction that required digital relief the patient denies fever, chills or any other symptoms or complaints at this time.  She underwent bowel prep yesterday and stopped having loose bowel movements around 7 PM.  She had a low potassium of 3.3 and is taking potassium supplements increased rate for the last 5 days.  She drank a cup of coffee with nondairy creamer this morning at 5 AM.  This is been discussed with anesthesia and the decision to proceed has been made.  ROS:  ROS +rectocele +constipation -fever -chills All systems are negative except as noted in the HPI and PMH.  Pap smear results are class I normal Pap with negative HPV Pertinent History Reviewed:   Reviewed: Significant for rectocele Medical             Past Medical History:  Diagnosis Date  . Anxiety   . Hypertension   . Hypothyroidism   . Pelvic pressure in female 01/23/2014  . Positive fecal occult blood test 06/23/2015  . Rectocele 01/23/2014  . 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 Thyroid disease                               Surgical Hx:         Past Surgical History:  Procedure Laterality Date  . CESAREAN SECTION    . COLONOSCOPY N/A 09/11/2015   Procedure: COLONOSCOPY;  Surgeon: Rogene Houston, MD;  Location: [facility];  Service: Endoscopy;  Laterality: N/A;  moved to 1/6 @ 1:35 - Ann to notify pt  . LUNG  SURGERY     Medications: Reviewed & Updated - see associated section                       Current Outpatient Medications:  .  ALPRAZolam (XANAX) 0.5 MG tablet, Take 0.5 mg by mouth daily as needed. anxiety, Disp: , Rfl:  .  aspirin EC 81 MG tablet, Take 81 mg daily by mouth., Disp: , Rfl:  .  calcium-vitamin D 250-100 MG-UNIT tablet, Take 1 tablet daily by mouth., Disp: , Rfl:  .  citalopram (CELEXA) 40 MG tablet, Take 40 mg by mouth daily. , Disp: , Rfl:  .  furosemide (LASIX) 20 MG tablet, Take 20 mg by mouth daily as needed. fluid, Disp: , Rfl:  .  HYDROcodone-acetaminophen (NORCO/VICODIN) 5-325 MG tablet, Take 1 tablet by mouth every 6 (six) hours as needed for moderate pain., Disp: 30 tablet, Rfl: 0 .  hydrocortisone (ANUSOL-HC) 25 MG suppository, Place 1 suppository (25 mg total) rectally at bedtime., Disp: 14 suppository, Rfl: 1 .  levothyroxine (SYNTHROID, LEVOTHROID) 75 MCG tablet, Take 75 mcg by mouth daily., Disp: , Rfl:  .  losartan-hydrochlorothiazide (HYZAAR) 100-25 MG tablet, Take 0.5 tablets daily by mouth., Disp: , Rfl:  .  Omega-3 Fatty Acids (FISH OIL) 1000 MG CAPS, Take by mouth daily., Disp: , Rfl:  .  zolpidem (AMBIEN) 10 MG tablet, Take 5 mg by mouth at bedtime as needed. sleep, Disp: , Rfl:    Social History: Reviewed -  reports that she has been smoking cigarettes.  She has a 40.00 pack-year smoking history. She has never used smokeless tobacco.  Objective Findings:  Vitals: Blood pressure 126/86, pulse 82, height 5\' 4"  (1.626 m), weight 197 lb (89.4 kg).  PHYSICAL EXAMINATION General appearance - alert, well appearing, and in no distress, oriented to person, place, and time and overweight Mental status - alert, oriented to person, place, and time, normal mood, behavior, speech, dress, motor activity, and thought processes, affect appropriate to mood Chest - clear to auscultation, no wheezes, rales or rhonchi, symmetric air entry Heart - normal rate and  regular rhythm Abdomen - soft, nontender, nondistended, no masses or organomegaly Breasts - breasts appear normal, no suspicious masses, no skin or nipple changes or axillary nodes  PELVIC External genitalia - normal Vagina - anterior support good Cervix - atrophic, tiny  Uterus - atrophic tiny  Adnexa - negative Rectal - normal rectal, no masses, guaiac negative stool obtained, protruding i rectocele to the level of the introitus  CBC    Component Value Date/Time   WBC 10.0 12/27/2017 1511   RBC 4.50 12/27/2017 1511   HGB 13.3 12/27/2017 1511   HCT 41.4 12/27/2017 1511   PLT 218 12/27/2017 1511   MCV 92.0 12/27/2017 1511   MCH 29.6 12/27/2017 1511   MCHC 32.1 12/27/2017 1511   RDW 14.4 12/27/2017 1511   CMP Latest Ref Rng & Units 12/27/2017 04/01/2012 01/20/2008  Glucose 65 - 99 mg/dL 106(H) 90 146(H)  BUN 6 - 20 mg/dL 18 17 9   Creatinine 0.44 - 1.00 mg/dL 0.92 0.99 0.59  Sodium 135 - 145 mmol/L 137 137 140  Potassium 3.5 - 5.1 mmol/L 3.3(L) 3.8 3.5  Chloride 101 - 111 mmol/L 96(L) 102 106  CO2 22 - 32 mmol/L 30 29 28   Calcium 8.9 - 10.3 mg/dL 9.5 9.6 8.5  Total Protein 6.5 - 8.1 g/dL 7.0 - -  Total Bilirubin 0.3 - 1.2 mg/dL 0.7 - -  Alkaline Phos 38 - 126 U/L 58 - -  AST 15 - 41 U/L 35 - -  ALT 14 - 54 U/L 19 - -     Assessment & Plan:   A:  1. Pap Smear, normal 2. Grade III Rectocele protruding introitus  3. Urge incontinence  P:  1. Posterior (Rectocele) Repair, Scheduled 4/30 2. Rx Ditropan     The surgery has been discussed with the patient.  The decision to leave the uterus was discussed and the patient agrees with only correcting her posterior repair rectocele area

## 2018-01-02 NOTE — Anesthesia Procedure Notes (Signed)
Procedure Name: Intubation Date/Time: 01/02/2018 7:55 AM Performed by: Ollen Bowl, CRNA Pre-anesthesia Checklist: Patient identified, Patient being monitored, Timeout performed, Emergency Drugs available and Suction available Patient Re-evaluated:Patient Re-evaluated prior to induction Oxygen Delivery Method: Circle system utilized Preoxygenation: Pre-oxygenation with 100% oxygen Induction Type: IV induction, Rapid sequence and Cricoid Pressure applied Ventilation: Mask ventilation without difficulty Laryngoscope Size: Mac and 3 Grade View: Grade I Tube type: Oral Tube size: 7.0 mm Number of attempts: 1 Airway Equipment and Method: Stylet Placement Confirmation: ETT inserted through vocal cords under direct vision,  positive ETCO2 and breath sounds checked- equal and bilateral Secured at: 21 cm Tube secured with: Tape Dental Injury: Teeth and Oropharynx as per pre-operative assessment

## 2018-01-02 NOTE — Discharge Instructions (Signed)
Posterior Colporrhaphy (posterior repair), Care After This sheet gives you information about how to care for yourself after your procedure. Your health care provider may also give you more specific instructions. If you have problems or questions, contact your health care provider. What can I expect after the procedure? After the procedure, it is common to have:  Pain in the surgical area.  Vaginal discharge. You will need to use a sanitary pad during this time.  Fatigue.  Follow these instructions at home: Incision care  Follow instructions from your health care provider about how to take care of your incision. Make sure you: ? Wash your hands with soap and water before touching the incision area. If soap and water are not available, use hand sanitizer. ? Clean your incision as told by your health care provider. ? Leave stitches (sutures), skin glue, or adhesive strips in place. These skin closures may need to stay in place for 2 weeks or longer. If adhesive strip edges start to loosen and curl up, you may trim the loose edges. Do not remove adhesive strips completely unless your health care provider tells you to do that.  Check your incision area every day for signs of infection. Check for: ? Redness, swelling, or pain. ? Fluid or blood. ? Warmth. ? Pus or a bad smell.  Check your incision every day to make sure the incision area is not separating or opening.  Do not take baths, swim, or use a hot tub until your health care provider approves. You may shower.  Keep the area between your vagina and rectum (perineal area) clean and dry. Make sure you clean the area after each bowel movement and each time you urinate.  Ask your health care provider if you can take a sitz bath or sit in a tub of clean, warm water. Activity  Do gentle, daily activity as told by your health care provider. You may be told to take short walks every day and go farther each time. Ask your health care  provider what activities are safe for you.  Limit stair climbing to once or twice a day in the first week, then slowly increase this activity.  Do not lift anything that is heavier than 10 lbs. (4.5 kg), or the limit that your health care provider tells you, until he or she says that it is safe. Avoid pushing or pulling motions.  Avoid standing for long periods of time.  Do not douche, use tampons, or have sex until your health care provider says it is okay.  Do not drive or use heavy machinery while taking prescription pain medicine. To prevent constipation  To prevent or treat constipation while you are taking prescription pain medicine, your health care provider may recommend that you: ? Take over-the-counter or prescription medicines. ? Eat foods that are high in fiber, such as fresh fruits and vegetables, whole grains, and beans. ? Drink enough fluid to keep your urine clear or pale yellow. ? Limit foods that are high in fat and processed sugars, such as fried and sweet foods. General instructions  You may be instructed to do pelvic floor exercises (kegels) as told by your health care provider.  Take over-the-counter and prescription medicines only as told by your health care provider.  Keep all follow-up visits as told by your health care provider. This is important. Contact a health care provider if:  Medicine does not help your pain.  You have frequent or urgent urination, or you are unable  to completely empty your bladder.  You feel a burning sensation when urinating.  You have fluid or blood coming from your incision.  You have pus or a bad smell coming from the incision.  Your incision feels warm to the touch.  You have redness, swelling, or pain around your incision. Get help right away if:  You have a fever or chills.  Your incision separates or opens.  You cannot urinate.  You have trouble breathing. Summary  After the procedure, it is common to have  pain, fatigue, and discharge from the vagina.  Keep the area between your vagina and rectum (perineal area) clean and dry. Make sure you clean the area after each bowel movement and each time you urinate.  Follow instructions from your health care provider on any activity restrictions after the procedure. This information is not intended to replace advice given to you by your health care provider. Make sure you discuss any questions you have with your health care provider. Document Released: 04/05/2004 Document Revised: 08/22/2016 Document Reviewed: 08/22/2016 Elsevier Interactive Patient Education  2017 [place]. Please take MiraLAX 1 capful daily to avoid constipation.  Notify office for any fever or chills or increasing pain after the first day

## 2018-01-02 NOTE — Transfer of Care (Signed)
Immediate Anesthesia Transfer of Care Note  Patient: Regina Berry  Procedure(s) Performed: POSTERIOR REPAIR (RECTOCELE) (N/A Vagina )  Patient Location: PACU  Anesthesia Type:General  Level of Consciousness: awake  Airway & Oxygen Therapy: Patient Spontanous Breathing and Patient connected to face mask oxygen  Post-op Assessment: Report given to RN  Post vital signs: Reviewed and stable  Last Vitals:  Vitals Value Taken Time  BP 129/52 01/02/2018  9:00 AM  Temp    Pulse 81 01/02/2018  9:04 AM  Resp 15 01/02/2018  9:04 AM  SpO2 95 % 01/02/2018  9:04 AM  Vitals shown include unvalidated device data.  Last Pain:  Vitals:   01/02/18 0737  TempSrc: Oral  PainSc: 0-No pain      Patients Stated Pain Goal: 7 (72/25/75 0518)  Complications: No apparent anesthesia complications

## 2018-01-02 NOTE — Anesthesia Postprocedure Evaluation (Signed)
Anesthesia Post Note  Patient: [name]  Procedure(s) Performed: POSTERIOR REPAIR (RECTOCELE) (N/A Vagina )  Patient location during evaluation: PACU Anesthesia Type: General Level of consciousness: awake and alert Pain management: pain level controlled Vital Signs Assessment: post-procedure vital signs reviewed and stable Respiratory status: spontaneous breathing, nonlabored ventilation, respiratory function stable and patient connected to nasal cannula oxygen Cardiovascular status: blood pressure returned to baseline and stable Postop Assessment: no apparent nausea or vomiting Anesthetic complications: no     Last Vitals:  Vitals:   01/02/18 0737 01/02/18 0900  BP: 119/74 (!) 129/52  Pulse: (!) 59 84  Resp: 20 18  Temp: 36.7 C 36.7 C  SpO2: 95% 100%    Last Pain:  Vitals:   01/02/18 0900  TempSrc:   PainSc: Asleep                 Benay Pike

## 2018-01-02 NOTE — Anesthesia Preprocedure Evaluation (Signed)
Anesthesia Evaluation  Patient identified by MRN, date of birth, ID band Patient awake    Reviewed: Allergy & Precautions, H&P , NPO status , Patient's Chart, lab work & pertinent test results, reviewed documented beta blocker date and time   Airway Mallampati: II  TM Distance: <3 FB Neck ROM: full    Dental no notable dental hx. (+) Teeth Intact, Poor Dentition, Chipped   Pulmonary neg pulmonary ROS, Current Smoker,    Pulmonary exam normal breath sounds clear to auscultation       Cardiovascular Exercise Tolerance: Good hypertension, negative cardio ROS Normal cardiovascular exam Rhythm:regular Rate:Normal     Neuro/Psych negative neurological ROS  negative psych ROS   GI/Hepatic negative GI ROS, Neg liver ROS,   Endo/Other  negative endocrine ROS  Renal/GU negative Renal ROS  negative genitourinary   Musculoskeletal   Abdominal   Peds  Hematology negative hematology ROS (+)   Anesthesia Other Findings Chronic narcotics No clinical complaints Coffee at 5 am  Reproductive/Obstetrics negative OB ROS                             Anesthesia Physical Anesthesia Plan  ASA: III  Anesthesia Plan: General   Post-op Pain Management:    Induction:   PONV Risk Score and Plan:   Airway Management Planned:   Additional Equipment:   Intra-op Plan:   Post-operative Plan:   Informed Consent: I have reviewed the patients History and Physical, chart, labs and discussed the procedure including the risks, benefits and alternatives for the proposed anesthesia with the patient or authorized representative who has indicated his/her understanding and acceptance.   Dental Advisory Given  Plan Discussed with: CRNA  Anesthesia Plan Comments:         Anesthesia Quick Evaluation

## 2018-01-03 ENCOUNTER — Encounter (HOSPITAL_COMMUNITY): Payer: Self-pay | Admitting: Obstetrics and Gynecology

## 2018-01-15 ENCOUNTER — Encounter: Payer: Self-pay | Admitting: Obstetrics and Gynecology

## 2018-01-15 ENCOUNTER — Ambulatory Visit (INDEPENDENT_AMBULATORY_CARE_PROVIDER_SITE_OTHER): Payer: Medicare Other | Admitting: Obstetrics and Gynecology

## 2018-01-15 VITALS — BP 124/70 | HR 76 | Ht 64.0 in | Wt 199.0 lb

## 2018-01-15 DIAGNOSIS — N816 Rectocele: Secondary | ICD-10-CM

## 2018-01-15 DIAGNOSIS — Z09 Encounter for follow-up examination after completed treatment for conditions other than malignant neoplasm: Secondary | ICD-10-CM

## 2018-01-15 DIAGNOSIS — Z9889 Other specified postprocedural states: Secondary | ICD-10-CM

## 2018-01-15 NOTE — Progress Notes (Signed)
   Subjective:  Regina Berry is a 67 y.o. female now 2 weeks status post posterior repair. She says her bottom "feels fabulous", and she is not having any issues with bowel movements. She says the bleeding went down almost immediately, and she only had some spotting.   Review of Systems Negative   Diet:   normal   Bowel movements : normal.  The patient is not having any pain.  Objective:  BP 124/70 (BP Location: Right Arm, Patient Position: Sitting, Cuff Size: Normal)   Pulse 76   Ht 5\' 4"  (1.626 m)   Wt 199 lb (90.3 kg)   BMI 34.16 kg/m  General:Well developed, well nourished.  No acute distress. Abdomen: Bowel sounds normal, soft, non-tender. Pelvic Exam:    External Genitalia:  Normal.    Vagina: Normal    Cervix: Normal    Uterus: Normal    Adnexa/Bimanual: Normal  Incision(s):   Healing well, no drainage, no erythema, no hernia, no swelling, no dehiscence, some swelling  Kegel : good increase in rectal-vaginal tone. Assessment:  Post-Op 2 weeks s/p posterior repair    Doing well postoperatively.   Plan:  1.Wound care discussed  2. . current medications.none 3. Activity restrictions: no lifting more than 25 pounds, no sexual activity until fully healed 4. return to work: 4 weeks. 5. Follow up in 4 weeks.   By signing my name below, I, Izna Ahmed, attest that this documentation has been prepared under the direction and in the presence of Jonnie Kind, MD. Electronically Signed: Jabier Gauss, Medical Scribe. 01/15/18. 2:40 PM.  I personally performed the services described in this documentation, which was SCRIBED in my presence. The recorded information has been reviewed and considered accurate. It has been edited as necessary during review. Jonnie Kind, MD

## 2018-01-19 ENCOUNTER — Telehealth: Payer: Self-pay | Admitting: *Deleted

## 2018-01-19 NOTE — Telephone Encounter (Signed)
-----   Message from Sueanne Margarita, MD sent at 01/16/2018  8:51 AM EDT ----- No significant oxygen desats on CPAP

## 2018-01-19 NOTE — Telephone Encounter (Signed)
Patient understands her ONO showed No significant oxygen desats on CPAP. Pt is aware and agreeable to normal results.

## 2018-02-12 ENCOUNTER — Ambulatory Visit (INDEPENDENT_AMBULATORY_CARE_PROVIDER_SITE_OTHER): Payer: Medicare Other | Admitting: Obstetrics and Gynecology

## 2018-02-12 ENCOUNTER — Encounter: Payer: Self-pay | Admitting: Obstetrics and Gynecology

## 2018-02-12 VITALS — BP 115/69 | HR 70 | Ht 64.0 in | Wt 201.0 lb

## 2018-02-12 DIAGNOSIS — Z9889 Other specified postprocedural states: Secondary | ICD-10-CM

## 2018-02-12 DIAGNOSIS — Z30431 Encounter for routine checking of intrauterine contraceptive device: Secondary | ICD-10-CM

## 2018-02-12 NOTE — Progress Notes (Signed)
Patient ID: Regina Berry, female   DOB: Oct 30, 1950, 67 y.o.   MRN: [id number]  Subjective:  Regina Berry is a 67 y.o. female now 6 weeks status post POSTERIOR REPAIR (RECTOCELE).   Patient is still using stool softener and reports no complaints easy passing bowel movements described. Review of Systems Negative    Diet:   normal   Bowel movements : normal.  The patient is not having any pain.  She is not sexually active  Objective:  There were no vitals taken for this visit. General:Well developed, well nourished.  No acute distress. Abdomen: Bowel sounds normal, soft, non-tender. Pelvic Exam:    External Genitalia:  Normal.    Vagina: Normal, good muscle tone    Cervix: Normal    Uterus: Normal    Adnexa/Bimanual: Normal  Incision(s):   Healing well, no drainage, no erythema, no hernia, no swelling, no dehiscence,  Assessment:  Post-Op 6 weeks s/p POSTERIOR REPAIR (RECTOCELE)   Doing well postoperatively.   Plan:  1.Wound care discussed   2. Current medications. 3. Activity restrictions: none 4. return to work: not applicable. 5. Follow up in  PRN.  By signing my name below, I, Samul Dada, attest that this documentation has been prepared under the direction and in the presence of Jonnie Kind, MD. Electronically Signed: [place]. 02/12/18. 9:53 AM.  I personally performed the services described in this documentation, which was SCRIBED in my presence. The recorded information has been reviewed and considered accurate. It has been edited as necessary during review. Jonnie Kind, MD

## 2018-06-14 DIAGNOSIS — E669 Obesity, unspecified: Secondary | ICD-10-CM | POA: Diagnosis not present

## 2018-06-14 DIAGNOSIS — Z6835 Body mass index (BMI) 35.0-35.9, adult: Secondary | ICD-10-CM | POA: Diagnosis not present

## 2018-06-14 DIAGNOSIS — M349 Systemic sclerosis, unspecified: Secondary | ICD-10-CM | POA: Diagnosis not present

## 2018-06-14 DIAGNOSIS — I7300 Raynaud's syndrome without gangrene: Secondary | ICD-10-CM | POA: Diagnosis not present

## 2018-06-14 DIAGNOSIS — R5383 Other fatigue: Secondary | ICD-10-CM | POA: Diagnosis not present

## 2018-06-15 DIAGNOSIS — I10 Essential (primary) hypertension: Secondary | ICD-10-CM | POA: Diagnosis not present

## 2018-06-15 DIAGNOSIS — Z23 Encounter for immunization: Secondary | ICD-10-CM | POA: Diagnosis not present

## 2018-06-15 DIAGNOSIS — Z719 Counseling, unspecified: Secondary | ICD-10-CM | POA: Diagnosis not present

## 2018-06-15 DIAGNOSIS — Z6835 Body mass index (BMI) 35.0-35.9, adult: Secondary | ICD-10-CM | POA: Diagnosis not present

## 2018-06-15 DIAGNOSIS — E039 Hypothyroidism, unspecified: Secondary | ICD-10-CM | POA: Diagnosis not present

## 2018-06-15 DIAGNOSIS — Z1389 Encounter for screening for other disorder: Secondary | ICD-10-CM | POA: Diagnosis not present

## 2018-06-15 DIAGNOSIS — E782 Mixed hyperlipidemia: Secondary | ICD-10-CM | POA: Diagnosis not present

## 2018-06-15 DIAGNOSIS — F419 Anxiety disorder, unspecified: Secondary | ICD-10-CM | POA: Diagnosis not present

## 2018-06-25 ENCOUNTER — Other Ambulatory Visit (HOSPITAL_COMMUNITY): Payer: Self-pay | Admitting: Family Medicine

## 2018-06-25 DIAGNOSIS — Z1231 Encounter for screening mammogram for malignant neoplasm of breast: Secondary | ICD-10-CM

## 2018-07-05 ENCOUNTER — Ambulatory Visit (HOSPITAL_COMMUNITY)
Admission: RE | Admit: 2018-07-05 | Discharge: 2018-07-05 | Disposition: A | Discharge status: Home / Self Care | Payer: Medicare Other | Source: Ambulatory Visit | Attending: Family Medicine | Admitting: Family Medicine

## 2018-07-05 DIAGNOSIS — Z1231 Encounter for screening mammogram for malignant neoplasm of breast: Secondary | ICD-10-CM | POA: Diagnosis not present

## 2018-09-05 DIAGNOSIS — C801 Malignant (primary) neoplasm, unspecified: Secondary | ICD-10-CM

## 2018-09-05 HISTORY — DX: Malignant (primary) neoplasm, unspecified: C80.1

## 2018-12-05 DIAGNOSIS — Z1389 Encounter for screening for other disorder: Secondary | ICD-10-CM | POA: Diagnosis not present

## 2018-12-05 DIAGNOSIS — E039 Hypothyroidism, unspecified: Secondary | ICD-10-CM | POA: Diagnosis not present

## 2018-12-05 DIAGNOSIS — Z6835 Body mass index (BMI) 35.0-35.9, adult: Secondary | ICD-10-CM | POA: Diagnosis not present

## 2018-12-05 DIAGNOSIS — F419 Anxiety disorder, unspecified: Secondary | ICD-10-CM | POA: Diagnosis not present

## 2018-12-05 DIAGNOSIS — G894 Chronic pain syndrome: Secondary | ICD-10-CM | POA: Diagnosis not present

## 2018-12-05 DIAGNOSIS — I10 Essential (primary) hypertension: Secondary | ICD-10-CM | POA: Diagnosis not present

## 2018-12-05 DIAGNOSIS — E6609 Other obesity due to excess calories: Secondary | ICD-10-CM | POA: Diagnosis not present

## 2018-12-05 DIAGNOSIS — E0500 Thyrotoxicosis with diffuse goiter without thyrotoxic crisis or storm: Secondary | ICD-10-CM | POA: Diagnosis not present

## 2018-12-05 DIAGNOSIS — M349 Systemic sclerosis, unspecified: Secondary | ICD-10-CM | POA: Diagnosis not present

## 2019-03-13 DIAGNOSIS — I7300 Raynaud's syndrome without gangrene: Secondary | ICD-10-CM | POA: Diagnosis not present

## 2019-03-13 DIAGNOSIS — G894 Chronic pain syndrome: Secondary | ICD-10-CM | POA: Diagnosis not present

## 2019-03-13 DIAGNOSIS — Z1389 Encounter for screening for other disorder: Secondary | ICD-10-CM | POA: Diagnosis not present

## 2019-03-13 DIAGNOSIS — Z6835 Body mass index (BMI) 35.0-35.9, adult: Secondary | ICD-10-CM | POA: Diagnosis not present

## 2019-03-13 DIAGNOSIS — E6609 Other obesity due to excess calories: Secondary | ICD-10-CM | POA: Diagnosis not present

## 2019-03-13 DIAGNOSIS — Z0001 Encounter for general adult medical examination with abnormal findings: Secondary | ICD-10-CM | POA: Diagnosis not present

## 2019-03-13 DIAGNOSIS — E039 Hypothyroidism, unspecified: Secondary | ICD-10-CM | POA: Diagnosis not present

## 2019-03-13 DIAGNOSIS — E0500 Thyrotoxicosis with diffuse goiter without thyrotoxic crisis or storm: Secondary | ICD-10-CM | POA: Diagnosis not present

## 2019-03-13 DIAGNOSIS — I10 Essential (primary) hypertension: Secondary | ICD-10-CM | POA: Diagnosis not present

## 2019-03-13 DIAGNOSIS — F419 Anxiety disorder, unspecified: Secondary | ICD-10-CM | POA: Diagnosis not present

## 2019-03-22 ENCOUNTER — Telehealth: Payer: Self-pay

## 2019-03-22 NOTE — Telephone Encounter (Signed)
YOUR CARDIOLOGY TEAM HAS ARRANGED FOR AN E-VISIT FOR YOUR APPOINTMENT - PLEASE REVIEW IMPORTANT INFORMATION BELOW SEVERAL DAYS PRIOR TO YOUR APPOINTMENT  Due to the recent COVID-19 pandemic, we are transitioning in-person office visits to tele-medicine visits in an effort to decrease unnecessary exposure to our patients, their families, and staff. These visits are billed to your insurance just like a normal visit is. We also encourage you to sign up for MyChart if you have not already done so. You will need a smartphone if possible. For patients that do not have this, we can still complete the visit using a regular telephone but do prefer a smartphone to enable video when possible. You may have a family member that lives with you that can help. If possible, we also ask that you have a blood pressure cuff and scale at home to measure your blood pressure, heart rate and weight prior to your scheduled appointment. Patients with clinical needs that need an in-person evaluation and testing will still be able to come to the office if absolutely necessary. If you have any questions, feel free to call our office.     YOUR PROVIDER WILL BE USING THE FOLLOWING PLATFORM TO COMPLETE YOUR VISIT: Doximity  . IF USING MYCHART - How to Download the MyChart App to Your SmartPhone   - If Apple, go to App Store and type in MyChart in the search bar and download the app. If Android, ask patient to go to Google Play Store and type in MyChart in the search bar and download the app. The app is free but as with any other app downloads, your phone may require you to verify saved payment information or Apple/Android password.  - You will need to then log into the app with your MyChart username and password, and select [facility] as your healthcare provider to link the account.  - When it is time for your visit, go to the MyChart app, find appointments, and click Begin Video Visit. Be sure to Select Allow for your device to  access the Microphone and Camera for your visit. You will then be connected, and your provider will be with you shortly.  **If you have any issues connecting or need assistance, please contact MyChart service desk [phone] ([phone])**  **If using a computer, in order to ensure the best quality for your visit, you will need to use either of the following Internet Browsers: Google Chrome or Microsoft Edge**  . IF USING DOXIMITY or DOXY.ME - The staff will give you instructions on receiving your link to join the meeting the day of your visit.      2-3 DAYS BEFORE YOUR APPOINTMENT  You will receive a telephone call from one of our [facility] team members - your caller ID may say "Unknown caller." If this is a video visit, we will walk you through how to get the video launched on your phone. We will remind you check your blood pressure, heart rate and weight prior to your scheduled appointment. If you have an Apple Watch or Kardia, please upload any pertinent ECG strips the day before or morning of your appointment to MyChart. Our staff will also make sure you have reviewed the consent and agree to move forward with your scheduled tele-health visit.     THE DAY OF YOUR APPOINTMENT  Approximately 15 minutes prior to your scheduled appointment, you will receive a telephone call from one of Heart[facility] team - your caller ID may say "Unknown caller."    Our staff will confirm medications, vital signs for the day and any symptoms you may be experiencing. Please have this information available prior to the time of visit start. It may also be helpful for you to have a pad of paper and pen handy for any instructions given during your visit. They will also walk you through joining the smartphone meeting if this is a video visit.    CONSENT FOR TELE-HEALTH VISIT - PLEASE REVIEW  I hereby voluntarily request, consent and authorize [facility] and its employed or contracted physicians, physician  assistants, nurse practitioners or other licensed health care professionals (the Practitioner), to provide me with telemedicine health care services (the "Services") as deemed necessary by the treating Practitioner. I acknowledge and consent to receive the Services by the Practitioner via telemedicine. I understand that the telemedicine visit will involve communicating with the Practitioner through live audiovisual communication technology and the disclosure of certain medical information by electronic transmission. I acknowledge that I have been given the opportunity to request an in-person assessment or other available alternative prior to the telemedicine visit and am voluntarily participating in the telemedicine visit.  I understand that I have the right to withhold or withdraw my consent to the use of telemedicine in the course of my care at any time, without affecting my right to future care or treatment, and that the Practitioner or I may terminate the telemedicine visit at any time. I understand that I have the right to inspect all information obtained and/or recorded in the course of the telemedicine visit and may receive copies of available information for a reasonable fee.  I understand that some of the potential risks of receiving the Services via telemedicine include:  . Delay or interruption in medical evaluation due to technological equipment failure or disruption; . Information transmitted may not be sufficient (e.g. poor resolution of images) to allow for appropriate medical decision making by the Practitioner; and/or  . In rare instances, security protocols could fail, causing a breach of personal health information.  Furthermore, I acknowledge that it is my responsibility to provide information about my medical history, conditions and care that is complete and accurate to the best of my ability. I acknowledge that Practitioner's advice, recommendations, and/or decision may be based on  factors not within their control, such as incomplete or inaccurate data provided by me or distortions of diagnostic images or specimens that may result from electronic transmissions. I understand that the practice of medicine is not an exact science and that Practitioner makes no warranties or guarantees regarding treatment outcomes. I acknowledge that I will receive a copy of this consent concurrently upon execution via email to the email address I last provided but may also request a printed copy by calling the office of [facility].    I understand that my insurance will be billed for this visit.   I have read or had this consent read to me. . I understand the contents of this consent, which adequately explains the benefits and risks of the Services being provided via telemedicine.  . I have been provided ample opportunity to ask questions regarding this consent and the Services and have had my questions answered to my satisfaction. . I give my informed consent for the services to be provided through the use of telemedicine in my medical care  By participating in this telemedicine visit I agree to the above.  

## 2019-03-24 NOTE — Progress Notes (Signed)
Virtual Visit via Video Note   This visit type was conducted due to national recommendations for restrictions regarding the COVID-19 Pandemic (e.g. social distancing) in an effort to limit this patient's exposure and mitigate transmission in our community.  Due to her co-morbid illnesses, this patient is at least at moderate risk for complications without adequate follow up.  This format is felt to be most appropriate for this patient at this time.  All issues noted in this document were discussed and addressed.  A limited physical exam was performed with this format.  Please refer to the patient's chart for her consent to telehealth for [facility].   Evaluation Performed:  Follow-up visit  This visit type was conducted due to national recommendations for restrictions regarding the COVID-19 Pandemic (e.g. social distancing).  This format is felt to be most appropriate for this patient at this time.  All issues noted in this document were discussed and addressed.  No physical exam was performed (except for noted visual exam findings with Video Visits).  Please refer to the patient's chart (MyChart message for video visits and phone note for telephone visits) for the patient's consent to telehealth for [facility].  Date:  03/25/2019   ID:  [name], DOB 01/27/1951, MRN [id number]  Patient Location:  Home  Provider location:   [place]  PCP:  Sharilyn Sites, MD  Sleep Medicine:  Fransico Him, MD Electrophysiologist:  None   Chief Complaint:  OSA  History of Present Illness:    Regina Berry is a 68 y.o. female who presents via audio/video conferencing for a telehealth visit today.    JOCELYNNE Berry is a 68 y.o. female with a hx of liver derma, tobacco use, hypertension, anxiety and obesity.  2D echo showed no evidence of pulmonary hypertension or RV strain and CT showed no pulmonary fibrosis.  She was complaining of dyspnea on exertion when she saw Dr. Haroldine Laws and  given her obesity a sleep study was ordered.  This showed severe obstructive sleep apnea with an AHI of 29.4 with oxygen saturations as low as 75%.  She underwent CPAP titration to 6 cm water pressure and 2 L of oxygen and was also added for nocturnal hypoxemia  She is doing well with her CPAP device and thinks that she has gotten used to it.  She tolerates the mask and feels the pressure is adequate.  Since going on CPAP she feels rested in the am and has no significant daytime sleepiness.  She denies any significant mouth or nasal dryness or nasal congestion.  She does not think that he snores.     The patient does not have symptoms concerning for COVID-19 infection (fever, chills, cough, or new shortness of breath).    Prior CV studies:   The following studies were reviewed today:  PAP compliance download  Past Medical History:  Diagnosis Date  . Anemia   . Anxiety   . Chronic bronchitis ([place])   . GERD (gastroesophageal reflux disease)   . Hypertension   . Hypothyroidism   . Obesity (BMI 30-39.9) 12/21/2017  . OSA on CPAP 12/21/2017   Severe with AHI 29.4/h and oxygen desaturations as low as 75%.  She is now on CPAP at 6 cm water pressure with 2 L oxygen due to nocturnal hypoxemia  . Pelvic pressure in female 01/23/2014  . Positive fecal occult blood test 06/23/2015  . Rectocele 01/23/2014  . 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 Thyroid disease  Past Surgical History:  Procedure Laterality Date  . CESAREAN SECTION    . COLONOSCOPY N/A 09/11/2015   Procedure: COLONOSCOPY;  Surgeon: Rogene Houston, MD;  [facility];  Service: Endoscopy;  Laterality: N/A;  moved to 1/6 @ 1:35 - Ann to notify pt  . LUNG SURGERY Left    infection on outside of lung that had to be"scaped off".  Tildon Husky REPAIR N/A 01/02/2018   Procedure: POSTERIOR REPAIR (RECTOCELE);  Surgeon: Jonnie Kind, MD;  Location: [facility] ORS;  Service: Gynecology;  Laterality: N/A;     Current Meds  Medication Sig   . albuterol (PROVENTIL HFA;VENTOLIN HFA) 108 (90 Base) MCG/ACT inhaler Inhale 1-2 puffs into the lungs every 6 (six) hours as needed for wheezing or shortness of breath.  Marland Kitchen aspirin EC 81 MG tablet Take 81 mg daily by mouth.  Marland Kitchen buPROPion (WELLBUTRIN XL) 300 MG 24 hr tablet Take 300 mg by mouth daily.  . Calcium-Magnesium-Zinc (CAL-MAG-ZINC PO) Take 1 tablet by mouth daily.  . citalopram (CELEXA) 40 MG tablet Take 40 mg by mouth at bedtime.   . fluticasone (FLONASE) 50 MCG/ACT nasal spray Place 2 sprays into both nostrils daily.  . furosemide (LASIX) 20 MG tablet Take 20 mg by mouth daily as needed (for fluid retention.).   Marland Kitchen HYDROcodone-acetaminophen (NORCO/VICODIN) 5-325 MG tablet Take 1 tablet by mouth every 6 (six) hours as needed for moderate pain.  Marland Kitchen ibuprofen (ADVIL,MOTRIN) 200 MG tablet Take 600 mg by mouth every 8 (eight) hours as needed (for pain/headaches.).  Marland Kitchen levocetirizine (XYZAL) 5 MG tablet Take 5 mg by mouth daily.  Marland Kitchen levothyroxine (SYNTHROID, LEVOTHROID) 75 MCG tablet Take 88 mcg by mouth daily.   Marland Kitchen losartan-hydrochlorothiazide (HYZAAR) 100-25 MG tablet Take 0.5 tablets daily by mouth.  . Omega-3 Fatty Acids (FISH OIL) 1200 MG CAPS Take 1,200 mg by mouth daily.  . polyethylene glycol powder (MIRALAX) powder Take 17 g by mouth daily. To prevent constipation  . potassium chloride SA (K-DUR) 20 MEQ tablet Take 20 mEq by mouth once.  Marland Kitchen VITAMIN E PO Take 1 tablet by mouth daily.  Marland Kitchen zolpidem (AMBIEN) 10 MG tablet Take 10 mg by mouth at bedtime as needed for sleep.      Allergies:   Codeine   Social History   Tobacco Use  . Smoking status: Current Every Day Smoker    Packs/day: 1.00    Years: 40.00    Pack years: 40.00    Types: Cigarettes  . Smokeless tobacco: Never Used  Substance Use Topics  . Alcohol use: Yes    Alcohol/week: 0.0 standard drinks    Comment: occasionally  . Drug use: No     Family Hx: The patient's family history includes COPD in her mother; Cancer  in her father and mother; Diabetes in her father and mother; Heart disease in her father; Other in her daughter.  ROS:   Please see the history of present illness.     All other systems reviewed and are negative.   Labs/Other Tests and Data Reviewed:    Recent Labs: No results found for requested labs within last 8760 hours.   Recent Lipid Panel No results found for: CHOL, TRIG, HDL, CHOLHDL, LDLCALC, LDLDIRECT  Wt Readings from Last 3 Encounters:  03/25/19 199 lb (90.3 kg)  02/12/18 201 lb (91.2 kg)  01/15/18 199 lb (90.3 kg)     Objective:    Vital Signs:  BP (!) 146/71   Pulse 71  Ht 5\' 4"  (1.626 m)   Wt 199 lb (90.3 kg)   BMI 34.16 kg/m    CONSTITUTIONAL:  Well nourished, well developed female in no acute distress.  EYES: anicteric MOUTH: oral mucosa is pink RESPIRATORY: Normal respiratory effort, symmetric expansion CARDIOVASCULAR: No peripheral edema SKIN: No rash, lesions or ulcers MUSCULOSKELETAL: no digital cyanosis NEURO: Cranial Nerves II-XII grossly intact, moves all extremities PSYCH: Intact judgement and insight.  A&O x 3, Mood/affect appropriate   ASSESSMENT & PLAN:    1.  OSA - the patient is tolerating PAP therapy well without any problems. The PAP download was reviewed today and showed an AHI of 0.5/hr on 6 cm H2O with 97% compliance in using more than 4 hours nightly.  The patient has been using and benefiting from PAP use and will continue to benefit from therapy.   2.  Obesity  -I have encouraged her to get into a routine exercise program and cut back on carbs and portions.  -she is having problems with hip pain and has not been exercising  3.  Hypertension -Her BP is borderline controlled on exam today -She will continue on Losartan-HCT 100-25mg  1/2 tab daily  COVID-19 Education: The signs and symptoms of COVID-19 were discussed with the patient and how to seek care for testing (follow up with PCP or arrange E-visit).  The importance of  social distancing was discussed today.  Patient Risk:   After full review of this patient's clinical status, I feel that they are at least moderate risk at this time.  Time:   Today, I have spent 20 minutes directly with telemedicine discussing medical problems including OSA and obesity.  We also reviewed the symptoms of COVID 19 and the ways to protect against contracting the virus with telehealth technology.  I spent an additional 5 minutes reviewing patient's chart including PAP compliance download.  Medication Adjustments/Labs and Tests Ordered: Current medicines are reviewed at length with the patient today.  Concerns regarding medicines are outlined above.  Tests Ordered: No orders of the defined types were placed in this encounter.  Medication Changes: No orders of the defined types were placed in this encounter.   Disposition:  Follow up in 1 year(s)  Signed, Fransico Him, MD  03/25/2019 9:11 AM    [place]

## 2019-03-25 ENCOUNTER — Telehealth (INDEPENDENT_AMBULATORY_CARE_PROVIDER_SITE_OTHER): Payer: Medicare Other | Admitting: Cardiology

## 2019-03-25 ENCOUNTER — Other Ambulatory Visit: Payer: Self-pay

## 2019-03-25 ENCOUNTER — Encounter: Payer: Self-pay | Admitting: Cardiology

## 2019-03-25 ENCOUNTER — Telehealth: Payer: Self-pay | Admitting: *Deleted

## 2019-03-25 VITALS — BP 146/71 | HR 71 | Ht 64.0 in | Wt 199.0 lb

## 2019-03-25 DIAGNOSIS — E669 Obesity, unspecified: Secondary | ICD-10-CM | POA: Diagnosis not present

## 2019-03-25 DIAGNOSIS — G4733 Obstructive sleep apnea (adult) (pediatric): Secondary | ICD-10-CM

## 2019-03-25 DIAGNOSIS — I10 Essential (primary) hypertension: Secondary | ICD-10-CM | POA: Diagnosis not present

## 2019-03-25 DIAGNOSIS — Z9989 Dependence on other enabling machines and devices: Secondary | ICD-10-CM | POA: Diagnosis not present

## 2019-03-25 NOTE — Telephone Encounter (Signed)
-----   Message from Richmond Campbell, LPN sent at 8/00/3491  9:25 AM EDT ----- PT NEEDS C PAP SUPPLIES PER DR [place]

## 2019-03-25 NOTE — Patient Instructions (Signed)
Your physician recommends that you continue on your current medications as directed. Please refer to the Current Medication list given to you today.   Your physician wants you to follow-up in: YEAR WITH DR TURNER You will receive a reminder letter in the mail two months in advance. If you don't receive a letter, please call our office to schedule the follow-up appointment.  

## 2019-03-25 NOTE — Telephone Encounter (Signed)
Order placed today to [facility] for C PAP SUPPLIES PER DR TURNER

## 2019-04-03 DIAGNOSIS — M5442 Lumbago with sciatica, left side: Secondary | ICD-10-CM | POA: Diagnosis not present

## 2019-04-03 DIAGNOSIS — M1612 Unilateral primary osteoarthritis, left hip: Secondary | ICD-10-CM | POA: Diagnosis not present

## 2019-04-06 ENCOUNTER — Other Ambulatory Visit: Payer: Self-pay

## 2019-04-06 ENCOUNTER — Encounter (HOSPITAL_COMMUNITY): Payer: Self-pay

## 2019-04-06 ENCOUNTER — Emergency Department (HOSPITAL_COMMUNITY)
Admission: EM | Admit: 2019-04-06 | Discharge: 2019-04-06 | Disposition: A | Discharge status: Home / Self Care | Payer: Medicare Other | Attending: Emergency Medicine | Admitting: Emergency Medicine

## 2019-04-06 DIAGNOSIS — Y999 Unspecified external cause status: Secondary | ICD-10-CM | POA: Insufficient documentation

## 2019-04-06 DIAGNOSIS — Y929 Unspecified place or not applicable: Secondary | ICD-10-CM | POA: Diagnosis not present

## 2019-04-06 DIAGNOSIS — Y998 Other external cause status: Secondary | ICD-10-CM | POA: Diagnosis not present

## 2019-04-06 DIAGNOSIS — W2209XA Striking against other stationary object, initial encounter: Secondary | ICD-10-CM | POA: Insufficient documentation

## 2019-04-06 DIAGNOSIS — L03115 Cellulitis of right lower limb: Secondary | ICD-10-CM | POA: Insufficient documentation

## 2019-04-06 DIAGNOSIS — I10 Essential (primary) hypertension: Secondary | ICD-10-CM | POA: Insufficient documentation

## 2019-04-06 DIAGNOSIS — L089 Local infection of the skin and subcutaneous tissue, unspecified: Secondary | ICD-10-CM | POA: Diagnosis not present

## 2019-04-06 DIAGNOSIS — Y939 Activity, unspecified: Secondary | ICD-10-CM | POA: Diagnosis not present

## 2019-04-06 DIAGNOSIS — S8991XA Unspecified injury of right lower leg, initial encounter: Secondary | ICD-10-CM | POA: Diagnosis present

## 2019-04-06 DIAGNOSIS — T148XXA Other injury of unspecified body region, initial encounter: Secondary | ICD-10-CM

## 2019-04-06 DIAGNOSIS — F17210 Nicotine dependence, cigarettes, uncomplicated: Secondary | ICD-10-CM | POA: Insufficient documentation

## 2019-04-06 DIAGNOSIS — E039 Hypothyroidism, unspecified: Secondary | ICD-10-CM | POA: Diagnosis not present

## 2019-04-06 MED ORDER — SULFAMETHOXAZOLE-TRIMETHOPRIM 800-160 MG PO TABS: 1.0000 | ORAL_TABLET | Freq: Two times a day (BID) | ORAL | 0 refills | Status: AC

## 2019-04-06 MED ORDER — CEPHALEXIN 500 MG PO CAPS: 500.0000 mg | ORAL_CAPSULE | Freq: Three times a day (TID) | ORAL | 0 refills | Status: AC

## 2019-04-06 NOTE — ED Provider Notes (Signed)
[facility] Emergency Department Provider Note MRN:  [id number]  Arrival date & time: 04/06/19     Chief Complaint   Laceration   History of Present Illness   Regina Berry is a 68 y.o. year-old female with a history of GERD, hypertension, obesity presenting to the ED with chief complaint of laceration.  Patient explains that she grazed her right shin on the wooden stairs 4 days ago.  Did not injure any other parts of her body, no head trauma.  Sustained a scrape to her right shin, which she has been trying to manage at home.  Over the past 1 or 2 days, the wound has become more tender, more redness surrounding the wound, some discharge.  Her mother told her that it was infected and she needed to be seen by her doctor.  She denies fever or any other symptoms.  Review of Systems  A complete 10 system review of systems was obtained and all systems are negative except as noted in the HPI and PMH.   Patient's Health History    Past Medical History:  Diagnosis Date  . Anemia   . Anxiety   . Chronic bronchitis ([place])   . GERD (gastroesophageal reflux disease)   . Hypertension   . Hypothyroidism   . Obesity (BMI 30-39.9) 12/21/2017  . OSA on CPAP 12/21/2017   Severe with AHI 29.4/h and oxygen desaturations as low as 75%.  She is now on CPAP at 6 cm water pressure with 2 L oxygen due to nocturnal hypoxemia  . Pelvic pressure in female 01/23/2014  . Positive fecal occult blood test 06/23/2015  . Rectocele 01/23/2014  . 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 Thyroid disease     Past Surgical History:  Procedure Laterality Date  . CESAREAN SECTION    . COLONOSCOPY N/A 09/11/2015   Procedure: COLONOSCOPY;  Surgeon: Rogene Houston, MD;  [facility];  Service: Endoscopy;  Laterality: N/A;  moved to 1/6 @ 1:35 - Ann to notify pt  . LUNG SURGERY Left    infection on outside of lung that had to be"scaped off".  Tildon Husky REPAIR N/A 01/02/2018   Procedure: POSTERIOR  REPAIR (RECTOCELE);  Surgeon: Jonnie Kind, MD;  Location: [facility] ORS;  Service: Gynecology;  Laterality: N/A;    Family History  Problem Relation Age of Onset  . Diabetes Mother   . Cancer Mother        ovarian  . COPD Mother   . Diabetes Father   . Cancer Father        lymphoma  . Heart disease Father   . Other Daughter        transverse mylitis    Social History   Socioeconomic History  . Marital status: Married    Spouse name: Not on file  . Number of children: Not on file  . Years of education: Not on file  . Highest education level: Not on file  Occupational History  . Not on file  Social Needs  . Financial resource strain: Not on file  . Food insecurity    Worry: Not on file    Inability: Not on file  . Transportation needs    Medical: Not on file    Non-medical: Not on file  Tobacco Use  . Smoking status: Current Every Day Smoker    Packs/day: 1.00    Years: 40.00    Pack years: 40.00    Types: Cigarettes  .  Smokeless tobacco: Never Used  Substance and Sexual Activity  . Alcohol use: Yes    Alcohol/week: 0.0 standard drinks    Comment: occasionally  . Drug use: No  . Sexual activity: Not Currently    Birth control/protection: Post-menopausal  Lifestyle  . Physical activity    Days per week: Not on file    Minutes per session: Not on file  . Stress: Not on file  Relationships  . Social Herbalist on phone: Not on file    Gets together: Not on file    Attends religious service: Not on file    Active member of club or organization: Not on file    Attends meetings of clubs or organizations: Not on file    Relationship status: Not on file  . Intimate partner violence    Fear of current or ex partner: Not on file    Emotionally abused: Not on file    Physically abused: Not on file    Forced sexual activity: Not on file  Other Topics Concern  . Not on file  Social History Narrative  . Not on file     Physical Exam  Vital Signs and  Nursing Notes reviewed Vitals:   04/06/19 0841 04/06/19 0906  BP:    Pulse:    Resp:  16  Temp: 98.2 F (36.8 C)     CONSTITUTIONAL: Well-appearing, NAD NEURO:  Alert and oriented x 3, no focal deficits EYES:  eyes equal and reactive ENT/NECK:  no LAD, no JVD CARDIO: Regular rate, well-perfused, normal S1 and S2 PULM:  CTAB no wheezing or rhonchi GI/GU:  normal bowel sounds, non-distended, non-tender MSK/SPINE:  No gross deformities, no edema SKIN:  no rash, 3 cm oval shaped wound to the mid right shin with some purulent material within, with surrounding mild erythema PSYCH:  Appropriate speech and behavior  Diagnostic and Interventional Summary    Labs Reviewed - No data to display  No orders to display    Medications - No data to display   Procedures Critical Care  ED Course and Medical Decision Making  I have reviewed the triage vital signs and the nursing notes.  Pertinent labs & imaging results that were available during my care of the patient were reviewed by me and considered in my medical decision making (see below for details).  Suspect wound infection with surrounding cellulitis, this was a grazing injury according to the patient, little to no concern for deep space infection or osteo, no systemic symptoms, no fever, normal vital signs here.  No indication for imaging, patient is appropriate for outpatient management with p.o. antibiotics, strict return precautions for fever or worsening redness or pain.  After the discussed management above, the patient was determined to be safe for discharge.  The patient was in agreement with this plan and all questions regarding their care were answered.  ED return precautions were discussed and the patient will return to the ED with any significant worsening of condition.  Barth Kirks. Sedonia Small, [place] [email or web address]  Final Clinical Impressions(s) / ED Diagnoses      ICD-10-CM   1. Wound infection  T14.8XXA    L08.9   2. Cellulitis of right lower extremity  L03.115     ED Discharge Orders         Ordered    cephALEXin (KEFLEX) 500 MG capsule  3 times daily     04/06/19 1022  sulfamethoxazole-trimethoprim (BACTRIM DS) 800-160 MG tablet  2 times daily     04/06/19 1022             Maudie Flakes, MD 04/06/19 1025

## 2019-04-06 NOTE — ED Notes (Signed)
Pt removed dressing.  Skin tear noted to r shin.  Area red around wound and greenish colored drainage noted.

## 2019-04-06 NOTE — Discharge Instructions (Addendum)
You were evaluated in the Emergency Department and after careful evaluation, we did not find any emergent condition requiring admission or further testing in the hospital.  Your symptoms today seem to be due to an infection of the wound of your leg.  Please take the antibiotic medications prescribed as directed and use Tylenol or ibuprofen for pain.  Please return to the Emergency Department if you experience any worsening of your condition.  We encourage you to follow up with a primary care provider.  Thank you for allowing [place] to be a part of your care.

## 2019-04-06 NOTE — ED Triage Notes (Signed)
Pt reports rolled out of bed Tuesday and hit r shin on bed rail.  Reports had been treating it at home but now concerned it's infected.  Swelling noted below the dressing.

## 2019-04-12 DIAGNOSIS — M545 Low back pain: Secondary | ICD-10-CM | POA: Diagnosis not present

## 2019-04-12 DIAGNOSIS — M25552 Pain in left hip: Secondary | ICD-10-CM | POA: Diagnosis not present

## 2019-04-13 DIAGNOSIS — M545 Low back pain: Secondary | ICD-10-CM | POA: Diagnosis not present

## 2019-04-13 DIAGNOSIS — M25552 Pain in left hip: Secondary | ICD-10-CM | POA: Diagnosis not present

## 2019-04-17 DIAGNOSIS — M545 Low back pain: Secondary | ICD-10-CM | POA: Diagnosis not present

## 2019-04-17 DIAGNOSIS — M1612 Unilateral primary osteoarthritis, left hip: Secondary | ICD-10-CM | POA: Diagnosis not present

## 2019-04-18 DIAGNOSIS — H57813 Brow ptosis, bilateral: Secondary | ICD-10-CM | POA: Diagnosis not present

## 2019-04-18 DIAGNOSIS — H02835 Dermatochalasis of left lower eyelid: Secondary | ICD-10-CM | POA: Diagnosis not present

## 2019-04-18 DIAGNOSIS — H02831 Dermatochalasis of right upper eyelid: Secondary | ICD-10-CM | POA: Diagnosis not present

## 2019-04-18 DIAGNOSIS — H02832 Dermatochalasis of right lower eyelid: Secondary | ICD-10-CM | POA: Diagnosis not present

## 2019-04-18 DIAGNOSIS — H02834 Dermatochalasis of left upper eyelid: Secondary | ICD-10-CM | POA: Diagnosis not present

## 2019-04-18 DIAGNOSIS — H02413 Mechanical ptosis of bilateral eyelids: Secondary | ICD-10-CM | POA: Diagnosis not present

## 2019-05-06 DIAGNOSIS — M1612 Unilateral primary osteoarthritis, left hip: Secondary | ICD-10-CM | POA: Diagnosis not present

## 2019-05-06 DIAGNOSIS — M25552 Pain in left hip: Secondary | ICD-10-CM | POA: Diagnosis not present

## 2019-05-29 DIAGNOSIS — M25552 Pain in left hip: Secondary | ICD-10-CM | POA: Diagnosis not present

## 2019-05-29 DIAGNOSIS — M1612 Unilateral primary osteoarthritis, left hip: Secondary | ICD-10-CM | POA: Diagnosis not present

## 2019-06-13 ENCOUNTER — Other Ambulatory Visit: Payer: Self-pay

## 2019-06-13 DIAGNOSIS — Z20822 Contact with and (suspected) exposure to covid-19: Secondary | ICD-10-CM

## 2019-06-14 LAB — NOVEL CORONAVIRUS, NAA: SARS-CoV-2, NAA: DETECTED — AB

## 2019-06-18 ENCOUNTER — Telehealth: Payer: Self-pay

## 2019-06-18 NOTE — Telephone Encounter (Signed)
Pt. Called back to review quarantine requirements. Verbalizes understanding.

## 2019-06-20 DIAGNOSIS — J22 Unspecified acute lower respiratory infection: Secondary | ICD-10-CM | POA: Diagnosis not present

## 2019-06-20 DIAGNOSIS — U071 COVID-19: Secondary | ICD-10-CM | POA: Diagnosis not present

## 2019-07-15 DIAGNOSIS — M349 Systemic sclerosis, unspecified: Secondary | ICD-10-CM | POA: Diagnosis not present

## 2019-07-15 DIAGNOSIS — E669 Obesity, unspecified: Secondary | ICD-10-CM | POA: Diagnosis not present

## 2019-07-15 DIAGNOSIS — I7300 Raynaud's syndrome without gangrene: Secondary | ICD-10-CM | POA: Diagnosis not present

## 2019-07-15 DIAGNOSIS — Z6836 Body mass index (BMI) 36.0-36.9, adult: Secondary | ICD-10-CM | POA: Diagnosis not present

## 2019-09-06 HISTORY — PX: EYE SURGERY: SHX253

## 2019-09-23 DIAGNOSIS — L298 Other pruritus: Secondary | ICD-10-CM | POA: Diagnosis not present

## 2019-09-23 DIAGNOSIS — B078 Other viral warts: Secondary | ICD-10-CM | POA: Diagnosis not present

## 2019-09-23 DIAGNOSIS — C44722 Squamous cell carcinoma of skin of right lower limb, including hip: Secondary | ICD-10-CM | POA: Diagnosis not present

## 2019-09-23 DIAGNOSIS — Z1283 Encounter for screening for malignant neoplasm of skin: Secondary | ICD-10-CM | POA: Diagnosis not present

## 2019-09-23 DIAGNOSIS — D225 Melanocytic nevi of trunk: Secondary | ICD-10-CM | POA: Diagnosis not present

## 2019-09-24 ENCOUNTER — Other Ambulatory Visit (HOSPITAL_COMMUNITY): Payer: Self-pay | Admitting: Family Medicine

## 2019-09-24 DIAGNOSIS — Z1231 Encounter for screening mammogram for malignant neoplasm of breast: Secondary | ICD-10-CM

## 2019-09-30 ENCOUNTER — Other Ambulatory Visit: Payer: Self-pay

## 2019-09-30 ENCOUNTER — Ambulatory Visit (HOSPITAL_COMMUNITY)
Admission: RE | Admit: 2019-09-30 | Discharge: 2019-09-30 | Disposition: A | Discharge status: Home / Self Care | Payer: Medicare Other | Source: Ambulatory Visit | Attending: Family Medicine | Admitting: Family Medicine

## 2019-09-30 DIAGNOSIS — Z1231 Encounter for screening mammogram for malignant neoplasm of breast: Secondary | ICD-10-CM | POA: Insufficient documentation

## 2019-10-29 DIAGNOSIS — H25813 Combined forms of age-related cataract, bilateral: Secondary | ICD-10-CM | POA: Diagnosis not present

## 2019-11-20 DIAGNOSIS — H25013 Cortical age-related cataract, bilateral: Secondary | ICD-10-CM | POA: Diagnosis not present

## 2019-11-20 DIAGNOSIS — H2511 Age-related nuclear cataract, right eye: Secondary | ICD-10-CM | POA: Diagnosis not present

## 2019-11-20 DIAGNOSIS — H2513 Age-related nuclear cataract, bilateral: Secondary | ICD-10-CM | POA: Diagnosis not present

## 2019-11-20 DIAGNOSIS — H25043 Posterior subcapsular polar age-related cataract, bilateral: Secondary | ICD-10-CM | POA: Diagnosis not present

## 2019-11-26 DIAGNOSIS — L820 Inflamed seborrheic keratosis: Secondary | ICD-10-CM | POA: Diagnosis not present

## 2019-11-26 DIAGNOSIS — Z85828 Personal history of other malignant neoplasm of skin: Secondary | ICD-10-CM | POA: Diagnosis not present

## 2019-11-26 DIAGNOSIS — Z08 Encounter for follow-up examination after completed treatment for malignant neoplasm: Secondary | ICD-10-CM | POA: Diagnosis not present

## 2019-12-05 DIAGNOSIS — Z23 Encounter for immunization: Secondary | ICD-10-CM | POA: Diagnosis not present

## 2019-12-17 DIAGNOSIS — H25042 Posterior subcapsular polar age-related cataract, left eye: Secondary | ICD-10-CM | POA: Diagnosis not present

## 2019-12-17 DIAGNOSIS — H2512 Age-related nuclear cataract, left eye: Secondary | ICD-10-CM | POA: Diagnosis not present

## 2019-12-17 DIAGNOSIS — H25012 Cortical age-related cataract, left eye: Secondary | ICD-10-CM | POA: Diagnosis not present

## 2019-12-17 DIAGNOSIS — H2511 Age-related nuclear cataract, right eye: Secondary | ICD-10-CM | POA: Diagnosis not present

## 2019-12-17 DIAGNOSIS — H25811 Combined forms of age-related cataract, right eye: Secondary | ICD-10-CM | POA: Diagnosis not present

## 2019-12-24 DIAGNOSIS — H25812 Combined forms of age-related cataract, left eye: Secondary | ICD-10-CM | POA: Diagnosis not present

## 2019-12-24 DIAGNOSIS — H2512 Age-related nuclear cataract, left eye: Secondary | ICD-10-CM | POA: Diagnosis not present

## 2019-12-24 DIAGNOSIS — H2511 Age-related nuclear cataract, right eye: Secondary | ICD-10-CM | POA: Diagnosis not present

## 2019-12-27 DIAGNOSIS — Z23 Encounter for immunization: Secondary | ICD-10-CM | POA: Diagnosis not present

## 2020-01-04 ENCOUNTER — Other Ambulatory Visit: Payer: Self-pay

## 2020-01-04 ENCOUNTER — Ambulatory Visit (INDEPENDENT_AMBULATORY_CARE_PROVIDER_SITE_OTHER): Payer: Medicare Other

## 2020-01-04 ENCOUNTER — Ambulatory Visit
Admission: EM | Admit: 2020-01-04 | Discharge: 2020-01-04 | Disposition: A | Discharge status: Home / Self Care | Payer: Medicare Other | Attending: Emergency Medicine | Admitting: Emergency Medicine

## 2020-01-04 DIAGNOSIS — R0789 Other chest pain: Secondary | ICD-10-CM | POA: Diagnosis not present

## 2020-01-04 DIAGNOSIS — R0781 Pleurodynia: Secondary | ICD-10-CM

## 2020-01-04 DIAGNOSIS — R079 Chest pain, unspecified: Secondary | ICD-10-CM | POA: Diagnosis not present

## 2020-01-04 MED ORDER — PREDNISONE 10 MG (21) PO TBPK: ORAL_TABLET | Freq: Every day | ORAL | 0 refills | Status: DC

## 2020-01-04 NOTE — Discharge Instructions (Addendum)
Unable to rule out cardiac disease or blood clot in urgent care setting.  Offered patient further evaluation and management in the ED.  Patient declines at this time and would like to try outpatient therapy first.  Aware of the risk associated with this decision including missed diagnosis, organ damage, organ failure, and/or death.  Patient aware and in agreement.     Chest x-ray negative for cardiopulmonary disease or fracture Continue conservative management of rest, and ice Prednisone prescribed.  Take as directed and to completion Follow up with PCP if symptoms persist Present to ER if worsening or new symptoms (fever, chills, chest pain, worsening shortness of breath, calf pain, coughing up blood, lightheadedness, dizziness, etc...)

## 2020-01-04 NOTE — ED Triage Notes (Signed)
Pt presents with c/o pain under left breast. Describes as a sharp pain that comes and goes

## 2020-01-04 NOTE — ED Notes (Signed)
Pt also reports fall walking up steps, may have hit side

## 2020-01-04 NOTE — ED Provider Notes (Signed)
[place]   [id number] 01/04/20 Arrival Time: [id number]   CC: CHEST PAIN  SUBJECTIVE:  Regina Berry is a 69 y.o. female who presents with complaint of abrupt chest wall pain that began 3 days ago.  Reports a fall while walking up steps, but unsure if she hit her chest.  Localizes chest pain to underneath left breast, over LT side of rib cage.  Describes as worsening, that is intermittent (with episodes lasting a few seconds) and sharp in character.  Rates pain as 9-10/10.   Has tried OTC ibuprofen without relief.  Symptoms made worse with cough and deep breath.  Denies radiating symptoms.  Reports previous symptoms in the past with pleurisy.  Complains of chronic SOB, sweating at night, and anxiety.   Denies fever, chills, lightheadedness, dizziness, palpitations, tachycardia, nausea, vomiting, abdominal pain, changes in bowel or bladder habits, numbness/tingling in extremities, peripheral edema.    Admit to skin cancer, and tobacco use (1 PPD x 40-50 years)  Denies calf pain or swelling, recent long travel, recent surgery, malignancy, hormone use, or previous blood clot  Denies close relatives with cardiac hx  Previous cardiac testing: chest x-ray.  Had lung surgery on LT side for locuated emphysema  ROS: As per HPI.  All other pertinent ROS negative.    Past Medical History:  Diagnosis Date  . Anemia   . Anxiety   . Chronic bronchitis ([place])   . GERD (gastroesophageal reflux disease)   . Hypertension   . Hypothyroidism   . Obesity (BMI 30-39.9) 12/21/2017  . OSA on CPAP 12/21/2017   Severe with AHI 29.4/h and oxygen desaturations as low as 75%.  She is now on CPAP at 6 cm water pressure with 2 L oxygen due to nocturnal hypoxemia  . Pelvic pressure in female 01/23/2014  . Positive fecal occult blood test 06/23/2015  . Rectocele 01/23/2014  . 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 Thyroid disease    Past Surgical History:  Procedure Laterality Date  . CESAREAN SECTION    .  COLONOSCOPY N/A 09/11/2015   Procedure: COLONOSCOPY;  Surgeon: Rogene Houston, MD;  Location: [facility];  Service: Endoscopy;  Laterality: N/A;  moved to 1/6 @ 1:35 - Ann to notify pt  . LUNG SURGERY Left    infection on outside of lung that had to be"scaped off".  Tildon Husky REPAIR N/A 01/02/2018   Procedure: POSTERIOR REPAIR (RECTOCELE);  Surgeon: Jonnie Kind, MD;  Location: [facility] ORS;  Service: Gynecology;  Laterality: N/A;   Allergies  Allergen Reactions  . Codeine Itching   No current facility-administered medications on file prior to encounter.   Current Outpatient Medications on File Prior to Encounter  Medication Sig Dispense Refill  . albuterol (PROVENTIL HFA;VENTOLIN HFA) 108 (90 Base) MCG/ACT inhaler Inhale 1-2 puffs into the lungs every 6 (six) hours as needed for wheezing or shortness of breath.    Marland Kitchen aspirin EC 81 MG tablet Take 81 mg daily by mouth.    Marland Kitchen buPROPion (WELLBUTRIN XL) 300 MG 24 hr tablet Take 300 mg by mouth daily.    . Calcium-Magnesium-Zinc (CAL-MAG-ZINC PO) Take 1 tablet by mouth daily.    . fluticasone (FLONASE) 50 MCG/ACT nasal spray Place 2 sprays into both nostrils daily as needed.     . furosemide (LASIX) 20 MG tablet Take 20 mg by mouth daily as needed (for fluid retention.).     Marland Kitchen HYDROcodone-acetaminophen (NORCO/VICODIN) 5-325 MG tablet Take 1 tablet by mouth every  6 (six) hours as needed for moderate pain. 30 tablet 0  . ibuprofen (ADVIL,MOTRIN) 200 MG tablet Take 600 mg by mouth every 8 (eight) hours as needed (for pain/headaches.).    Marland Kitchen levocetirizine (XYZAL) 5 MG tablet Take 5 mg by mouth daily as needed.     Marland Kitchen levothyroxine (SYNTHROID) 88 MCG tablet Take 88 mcg by mouth daily.     Marland Kitchen losartan-hydrochlorothiazide (HYZAAR) 100-25 MG tablet Take 1 tablet by mouth daily.     . Omega-3 Fatty Acids (FISH OIL) 1200 MG CAPS Take 1,200 mg by mouth daily.    . polyethylene glycol powder (MIRALAX) powder Take 17 g by mouth daily. To prevent constipation  (Patient taking differently: Take 17 g by mouth daily as needed. To prevent constipation) 255 g prn  . potassium chloride SA (K-DUR) 20 MEQ tablet Take 20 mEq by mouth daily.     Marland Kitchen VITAMIN E PO Take 1 tablet by mouth daily.    Marland Kitchen zolpidem (AMBIEN) 10 MG tablet Take 5 mg by mouth at bedtime as needed for sleep.      Social History   Socioeconomic History  . Marital status: Married    Spouse name: Not on file  . Number of children: Not on file  . Years of education: Not on file  . Highest education level: Not on file  Occupational History  . Not on file  Tobacco Use  . Smoking status: Current Every Day Smoker    Packs/day: 1.00    Years: 40.00    Pack years: 40.00    Types: Cigarettes  . Smokeless tobacco: Never Used  Substance and Sexual Activity  . Alcohol use: Yes    Alcohol/week: 0.0 standard drinks    Comment: occasionally  . Drug use: No  . Sexual activity: Not Currently    Birth control/protection: Post-menopausal  Other Topics Concern  . Not on file  Social History Narrative  . Not on file   Social Determinants of Health   Financial Resource Strain:   . Difficulty of Paying Living Expenses:   Food Insecurity:   . Worried About Charity fundraiser in the Last Year:   . Arboriculturist in the Last Year:   Transportation Needs:   . Film/video editor (Medical):   Marland Kitchen Lack of Transportation (Non-Medical):   Physical Activity:   . Days of Exercise per Week:   . Minutes of Exercise per Session:   Stress:   . Feeling of Stress :   Social Connections:   . Frequency of Communication with Friends and Family:   . Frequency of Social Gatherings with Friends and Family:   . Attends Religious Services:   . Active Member of Clubs or Organizations:   . Attends Archivist Meetings:   Marland Kitchen Marital Status:   Intimate Partner Violence:   . Fear of Current or Ex-Partner:   . Emotionally Abused:   Marland Kitchen Physically Abused:   . Sexually Abused:    Family History    Problem Relation Age of Onset  . Diabetes Mother   . Cancer Mother        ovarian  . COPD Mother   . Diabetes Father   . Cancer Father        lymphoma  . Heart disease Father   . Other Daughter        transverse mylitis     OBJECTIVE:  Vitals:   01/04/20 0921  BP: (!) 151/84  Pulse: 75  Resp: 20  Temp: 98.5 F (36.9 C)  SpO2: 93%    General appearance: alert; no distress Eyes: PERRLA; EOMI; conjunctiva normal HENT: normocephalic; atraumatic Neck: supple Lungs: clear to auscultation bilaterally without adventitious breath sounds Heart: regular rate and rhythm.  Clear S1 and S2 without rubs, gallops, or murmur. Chest Wall: TTP over LT anterior lateral rib cage; TTP with AP and lateral compression of chest Extremities: no cyanosis or edema; symmetrical with no gross deformities Skin: warm and dry Psychological: alert and cooperative; normal mood and affect  DIAGNOSTIC STUDIES:  DG Chest 2 View  Result Date: 01/04/2020 CLINICAL DATA:  Pleuritic chest pain left-sided. Fall down stairs within the past week. EXAM: CHEST - 2 VIEW COMPARISON:  12/29/2017 FINDINGS: Lungs are adequately inflated and otherwise clear. Cardiomediastinal silhouette is normal. Degenerative change of the spine. No acute fracture. IMPRESSION: No acute findings. Electronically Signed   By: Marin Olp M.D.   On: 01/04/2020 10:01    X-rays negative for cardiopulmonary disease or fracture  I have reviewed the x-rays myself and the radiologist interpretation. I am in agreement with the radiologist interpretation.     ASSESSMENT & PLAN:  1. Chest wall pain     Meds ordered this encounter  Medications  . predniSONE (STERAPRED UNI-PAK 21 TAB) 10 MG (21) TBPK tablet    Sig: Take by mouth daily. Take 6 tabs by mouth daily  for 2 days, then 5 tabs for 2 days, then 4 tabs for 2 days, then 3 tabs for 2 days, 2 tabs for 2 days, then 1 tab by mouth daily for 2 days    Dispense:  42 tablet    Refill:  0     Order Specific Question:   Supervising Provider    Answer:   Raylene Everts [id number]   Unable to rule out cardiac disease or blood clot in urgent care setting.  Offered patient further evaluation and management in the ED.  Patient declines at this time and would like to try outpatient therapy first.  Aware of the risk associated with this decision including missed diagnosis, organ damage, organ failure, and/or death.  Patient aware and in agreement.     Chest x-ray negative for cardiopulmonary disease or fracture Continue conservative management of rest, and ice Prednisone prescribed.  Take as directed and to completion Follow up with PCP if symptoms persist Present to ER if worsening or new symptoms (fever, chills, chest pain, worsening shortness of breath, calf pain, coughing up blood, lightheadedness, dizziness, etc...)   Chest pain precautions given. Reviewed expectations re: course of current medical issues. Questions answered. Outlined signs and symptoms indicating need for more acute intervention. Patient verbalized understanding. After Visit Summary given.   Lestine Box, PA-C 01/04/20 1011

## 2020-02-28 NOTE — Patient Instructions (Addendum)
DUE TO COVID-19 ONLY ONE VISITOR IS ALLOWED TO COME WITH YOU AND STAY IN THE WAITING ROOM ONLY DURING PRE OP AND PROCEDURE DAY OF SURGERY. TWO  VISITOR MAY VISIT WITH YOU AFTER SURGERY IN YOUR PRIVATE ROOM DURING VISITING HOURS ONLY!  YOU NEED TO HAVE A COVID 19 TEST ON_7-9-______ @__2 :35_____, THIS TEST MUST BE DONE BEFORE SURGERY, COME  Regina Berry , [id number].  ([place]) ONCE YOUR COVID TEST IS COMPLETED, PLEASE BEGIN THE QUARANTINE INSTRUCTIONS AS OUTLINED IN YOUR HANDOUT.                [name]   Your procedure is scheduled on: 03-17-20   Report to [facility] Main  Entrance   Report to admitting  at     05:30 AM     Call this number if you have problems the morning of surgery [phone]    Remember: NO SOLID FOOD AFTER MIDNIGHT THE NIGHT PRIOR TO SURGERY.   NOTHING BY MOUTH EXCEPT CLEAR LIQUIDS UNTIL    04:30 am  .   PLEASE FINISH ENSURE DRINK PER SURGEON ORDER  WHICH NEEDS TO BE COMPLETED AT    0430 am then nothing by mouth .   BRUSH YOUR TEETH MORNING OF SURGERY AND RINSE YOUR MOUTH OUT, NO CHEWING GUM CANDY OR MINTS.     Take these medicines the morning of surgery with A SIP OF WATER: levothyroxine, wellbutrin                                 You may not have any metal on your body including hair pins and              piercings  Do not wear jewelry, make-up, lotions, powders or perfumes, deodorant             Do not wear nail polish on your fingernails.  Do not shave  48 hours prior to surgery.              Do not bring valuables to the hospital. [place].  Contacts, dentures or bridgework may not be worn into surgery. .     Patients discharged the day of surgery will not be allowed to drive home . IF YOU ARE HAVING SURGERY AND GOING HOME THE SAME DAY, YOU MUST HAVE AN ADULT TO DRIVE YOU HOME AND BE WITH YOU FOR 24 HOURS. YOU MAY GO HOME BY TAXI OR UBER OR  ORTHERWISE, BUT AN ADULT MUST ACCOMPANY YOU HOME AND STAY WITH YOU FOR 24 HOURS.  Name and phone number of your driver:  Special Instructions: N/A              Please read over the following fact sheets you were given: _____________________________________________________________________             [facility] - Preparing for Surgery Before surgery, you can play an important role.   Because skin is not sterile, your skin needs to be as free of germs as possible.   You can reduce the number of germs on your skin by washing with CHG (chlorahexidine gluconate) soap before surgery.   CHG is an antiseptic cleaner which kills germs and bonds with the skin to continue killing germs even after washing. Please DO NOT use  if you have an allergy to CHG or antibacterial soaps.   If your skin becomes reddened/irritated stop using the CHG and inform your nurse when you arrive at Short Stay. Do not shave (including legs and underarms) for at least 48 hours prior to the first CHG shower.   Please follow these instructions carefully:  1.  Shower with CHG Soap the night before surgery and the  morning of Surgery.  2.  If you choose to wash your hair, wash your hair first as usual with your  normal  shampoo.  3.  After you shampoo, rinse your hair and body thoroughly to remove the  shampoo.                                        4.  Use CHG as you would any other liquid soap.  You can apply chg directly  to the skin and wash                       Gently with a scrungie or clean washcloth.  5.  Apply the CHG Soap to your body ONLY FROM THE NECK DOWN.   Do not use on face/ open                           Wound or open sores. Avoid contact with eyes, ears mouth and genitals (private parts).                       Wash face,  Genitals (private parts) with your normal soap.             6.  Wash thoroughly, paying special attention to the area where your surgery  will be performed.  7.  Thoroughly rinse your body  with warm water from the neck down.  8.  DO NOT shower/wash with your normal soap after using and rinsing off  the CHG Soap.             9.  Pat yourself dry with a clean towel.            10.  Wear clean pajamas.            11.  Place clean sheets on your bed the night of your first shower and do not  sleep with pets. Day of Surgery : Do not apply any lotions/deodorants the morning of surgery.  Please wear clean clothes to the hospital/surgery center.  FAILURE TO FOLLOW THESE INSTRUCTIONS MAY RESULT IN THE CANCELLATION OF YOUR SURGERY PATIENT SIGNATURE_________________________________  NURSE SIGNATURE__________________________________  ________________________________________________________________________   [name]  An incentive spirometer is a tool that can help keep your lungs clear and active. This tool measures how well you are filling your lungs with each breath. Taking long deep breaths may help reverse or decrease the chance of developing breathing (pulmonary) problems (especially infection) following:  A long period of time when you are unable to move or be active. BEFORE THE PROCEDURE   If the spirometer includes an indicator to show your best effort, your nurse or respiratory therapist will set it to a desired goal.  If possible, sit up straight or lean slightly forward. Try not to slouch.  Hold the incentive spirometer in an upright position. INSTRUCTIONS FOR USE  1. Sit on the edge  of your bed if possible, or sit up as far as you can in bed or on a chair. 2. Hold the incentive spirometer in an upright position. 3. Breathe out normally. 4. Place the mouthpiece in your mouth and seal your lips tightly around it. 5. Breathe in slowly and as deeply as possible, raising the piston or the ball toward the top of the column. 6. Hold your breath for 3-5 seconds or for as long as possible. Allow the piston or ball to fall to the bottom of the column. 7. Remove the  mouthpiece from your mouth and breathe out normally. 8. Rest for a few seconds and repeat Steps 1 through 7 at least 10 times every 1-2 hours when you are awake. Take your time and take a few normal breaths between deep breaths. 9. The spirometer may include an indicator to show your best effort. Use the indicator as a goal to work toward during each repetition. 10. After each set of 10 deep breaths, practice coughing to be sure your lungs are clear. If you have an incision (the cut made at the time of surgery), support your incision when coughing by placing a pillow or rolled up towels firmly against it. Once you are able to get out of bed, walk around indoors and cough well. You may stop using the incentive spirometer when instructed by your caregiver.  RISKS AND COMPLICATIONS  Take your time so you do not get dizzy or light-headed.  If you are in pain, you may need to take or ask for pain medication before doing incentive spirometry. It is harder to take a deep breath if you are having pain. AFTER USE  Rest and breathe slowly and easily.  It can be helpful to keep track of a log of your progress. Your caregiver can provide you with a simple table to help with this. If you are using the spirometer at home, follow these instructions: [place] IF:   You are having difficultly using the spirometer.  You have trouble using the spirometer as often as instructed.  Your pain medication is not giving enough relief while using the spirometer.  You develop fever of 100.5 F (38.1 C) or higher. SEEK IMMEDIATE MEDICAL CARE IF:   You cough up bloody sputum that had not been present before.  You develop fever of 102 F (38.9 C) or greater.  You develop worsening pain at or near the incision site. MAKE SURE YOU:   Understand these instructions.  Will watch your condition.  Will get help right away if you are not doing well or get worse. Document Released: 01/02/2007 Document  Revised: 11/14/2011 Document Reviewed: 03/05/2007 [facility] Patient Information 2014 [place], [place].   ________________________________________________________________________

## 2020-03-05 ENCOUNTER — Encounter (HOSPITAL_COMMUNITY): Payer: Self-pay

## 2020-03-05 ENCOUNTER — Encounter (HOSPITAL_COMMUNITY)
Admission: RE | Admit: 2020-03-05 | Discharge: 2020-03-05 | Disposition: A | Discharge status: Home / Self Care | Payer: Medicare Other | Source: Ambulatory Visit | Attending: Orthopedic Surgery | Admitting: Orthopedic Surgery

## 2020-03-05 ENCOUNTER — Other Ambulatory Visit: Payer: Self-pay

## 2020-03-05 DIAGNOSIS — Z01818 Encounter for other preprocedural examination: Secondary | ICD-10-CM | POA: Diagnosis not present

## 2020-03-05 HISTORY — DX: Dyspnea, unspecified: R06.00

## 2020-03-05 HISTORY — DX: Unspecified osteoarthritis, unspecified site: M19.90

## 2020-03-05 HISTORY — DX: Chronic obstructive pulmonary disease, unspecified: J44.9

## 2020-03-05 LAB — COMPREHENSIVE METABOLIC PANEL
ALT: 11 U/L (ref 0–44)
AST: 23 U/L (ref 15–41)
Albumin: 3.9 g/dL (ref 3.5–5.0)
Alkaline Phosphatase: 60 U/L (ref 38–126)
Anion gap: 10 (ref 5–15)
BUN: 15 mg/dL (ref 8–23)
CO2: 30 mmol/L (ref 22–32)
Calcium: 9.3 mg/dL (ref 8.9–10.3)
Chloride: 101 mmol/L (ref 98–111)
Creatinine, Ser: 0.89 mg/dL (ref 0.44–1.00)
GFR calc Af Amer: >60 mL/min (ref >60)
GFR calc non Af Amer: >60 mL/min (ref >60)
Glucose, Bld: 90 mg/dL (ref 70–99)
Potassium: 4.7 mmol/L (ref 3.5–5.1)
Sodium: 141 mmol/L (ref 135–145)
Total Bilirubin: 0.6 mg/dL (ref 0.3–1.2)
Total Protein: 6.9 g/dL (ref 6.5–8.1)

## 2020-03-05 LAB — CBC
HCT: 41.3 % (ref 36.0–46.0)
Hemoglobin: 13.2 g/dL (ref 12.0–15.0)
MCH: 29.3 pg (ref 26.0–34.0)
MCHC: 32 g/dL (ref 30.0–36.0)
MCV: 91.6 fL (ref 80.0–100.0)
Platelets: 227 10*3/uL (ref 150–400)
RBC: 4.51 MIL/uL (ref 3.87–5.11)
RDW: 13.6 % (ref 11.5–15.5)
WBC: 8.3 10*3/uL (ref 4.0–10.5)
nRBC: 0 % (ref 0.0–0.2)

## 2020-03-05 LAB — SURGICAL PCR SCREEN
MRSA, PCR: NEGATIVE
Staphylococcus aureus: NEGATIVE

## 2020-03-05 NOTE — Progress Notes (Signed)
COVID Vaccine Completed:Yes Date COVID Vaccine completed:12/27/19 COVID vaccine manufacturer: [employer]      PCP - Dr. Sharilyn Sites Cardiologist - no  Chest x-ray - no EKG - 03/05/20 Stress Test - 2018 ECHO - 2018 Cardiac Cath - no  Sleep Study - yes CPAP - yes with 2 L O2  Fasting Blood Sugar - NA Checks Blood Sugar _____ times a day  Blood Thinner Instructions:ASA Aspirin Instructions:Pt couldn't remember the instructions. I told her to read them when she got home and call Md if any questions Last Dose:  Anesthesia review:   Patient denies shortness of breath, fever, cough and chest pain at PAT appointment Yes   Patient verbalized understanding of instructions that were given to them at the PAT appointment. Patient was also instructed that they will need to review over the PAT instructions again at home before surgery.  Yes  Pt is a smoker and has COPD. She is SOB when walking anywhere but she doesn't get SOB with House work.

## 2020-03-05 NOTE — Care Plan (Signed)
Ortho Bundle Case Management Note  Patient Details  Name: Regina Berry MRN: [id number] Date of Birth: 08/15/1951   Spoke with patient prior to surgery. Will discharge to home with family to assist. Rolling walker ordered for home use. HHPT referral to [facility]. OPPT set up  [facility].  Patient and MD in agreement with plan. Choice offered                   DME Arranged:  Walker rolling DME Agency:  [employer]  HH Arranged:  PT [place] Agency:  [facility] (formerly [facility])  Additional Comments: Please contact me with any questions of if this plan should need to change.  Ladell Heads,  [place] [facility]opaedic Specialist  [phone] 03/05/2020, 3:13 PM

## 2020-03-13 ENCOUNTER — Other Ambulatory Visit (HOSPITAL_COMMUNITY)
Admission: RE | Admit: 2020-03-13 | Discharge: 2020-03-13 | Disposition: A | Discharge status: Home / Self Care | Payer: Medicare Other | Source: Ambulatory Visit | Attending: Orthopedic Surgery | Admitting: Orthopedic Surgery

## 2020-03-13 DIAGNOSIS — Z01812 Encounter for preprocedural laboratory examination: Secondary | ICD-10-CM | POA: Insufficient documentation

## 2020-03-13 DIAGNOSIS — Z20822 Contact with and (suspected) exposure to covid-19: Secondary | ICD-10-CM | POA: Diagnosis not present

## 2020-03-13 LAB — SARS CORONAVIRUS 2 (TAT 6-24 HRS): SARS Coronavirus 2: NEGATIVE

## 2020-03-16 NOTE — Anesthesia Preprocedure Evaluation (Addendum)
Anesthesia Evaluation  Patient identified by MRN, date of birth, ID band Patient awake    Reviewed: Allergy & Precautions, H&P , NPO status , Patient's Chart, lab work & pertinent test results  Airway Mallampati: I       Dental  (+) Poor Dentition, Chipped   Pulmonary neg pulmonary ROS, sleep apnea and Continuous Positive Airway Pressure Ventilation , COPD, Current Smoker,    Pulmonary exam normal        Cardiovascular Exercise Tolerance: Good hypertension, Pt. on medications negative cardio ROS Normal cardiovascular exam     Neuro/Psych PSYCHIATRIC DISORDERS Anxiety Depression negative neurological ROS  negative psych ROS   GI/Hepatic Neg liver ROS,   Endo/Other  negative endocrine ROSHypothyroidism   Renal/GU negative Renal ROS  negative genitourinary   Musculoskeletal   Abdominal (+) + obese,   Peds  Hematology   Anesthesia Other Findings   Reproductive/Obstetrics negative OB ROS                            Anesthesia Physical  Anesthesia Plan  ASA: II  Anesthesia Plan: Spinal   Post-op Pain Management:    Induction:   PONV Risk Score and Plan: 3 and Ondansetron, Dexamethasone and Midazolam  Airway Management Planned: Natural Airway and Simple Face Mask  Additional Equipment: None  Intra-op Plan:   Post-operative Plan:   Informed Consent: I have reviewed the patients History and Physical, chart, labs and discussed the procedure including the risks, benefits and alternatives for the proposed anesthesia with the patient or authorized representative who has indicated his/her understanding and acceptance.       Plan Discussed with: CRNA  Anesthesia Plan Comments:        Anesthesia Quick Evaluation

## 2020-03-17 ENCOUNTER — Observation Stay (HOSPITAL_COMMUNITY): Payer: Medicare Other

## 2020-03-17 ENCOUNTER — Encounter (HOSPITAL_COMMUNITY)
Admission: RE | Disposition: A | Discharge status: Home / Self Care | Payer: Self-pay | Source: Home / Self Care | Attending: Orthopedic Surgery

## 2020-03-17 ENCOUNTER — Observation Stay (HOSPITAL_COMMUNITY)
Admission: RE | Admit: 2020-03-17 | Discharge: 2020-03-18 | Disposition: A | Discharge status: Home / Self Care | Payer: Medicare Other | Attending: Orthopedic Surgery | Admitting: Orthopedic Surgery

## 2020-03-17 ENCOUNTER — Other Ambulatory Visit: Payer: Self-pay

## 2020-03-17 ENCOUNTER — Ambulatory Visit (HOSPITAL_COMMUNITY): Payer: Medicare Other | Admitting: Anesthesiology

## 2020-03-17 ENCOUNTER — Encounter (HOSPITAL_COMMUNITY): Payer: Self-pay | Admitting: Orthopedic Surgery

## 2020-03-17 DIAGNOSIS — M1612 Unilateral primary osteoarthritis, left hip: Secondary | ICD-10-CM | POA: Diagnosis not present

## 2020-03-17 DIAGNOSIS — E039 Hypothyroidism, unspecified: Secondary | ICD-10-CM | POA: Diagnosis not present

## 2020-03-17 DIAGNOSIS — Z471 Aftercare following joint replacement surgery: Secondary | ICD-10-CM | POA: Diagnosis not present

## 2020-03-17 DIAGNOSIS — F418 Other specified anxiety disorders: Secondary | ICD-10-CM | POA: Diagnosis not present

## 2020-03-17 DIAGNOSIS — Z96642 Presence of left artificial hip joint: Secondary | ICD-10-CM | POA: Diagnosis not present

## 2020-03-17 DIAGNOSIS — I10 Essential (primary) hypertension: Secondary | ICD-10-CM | POA: Diagnosis not present

## 2020-03-17 HISTORY — PX: TOTAL HIP ARTHROPLASTY: SHX124

## 2020-03-17 SURGERY — ARTHROPLASTY, HIP, TOTAL,POSTERIOR APPROACH
Anesthesia: Spinal | Site: Hip | Laterality: Left

## 2020-03-17 MED ORDER — LOSARTAN POTASSIUM-HCTZ 100-25 MG PO TABS: 1.0000 | ORAL_TABLET | Freq: Every day | ORAL | Status: DC

## 2020-03-17 MED ORDER — POTASSIUM CHLORIDE IN NACL 20-0.45 MEQ/L-% IV SOLN
INTRAVENOUS | Status: DC
  Filled 2020-03-17 (×2): qty 1000

## 2020-03-17 MED ORDER — ACETAMINOPHEN 500 MG PO TABS
1000.0000 mg | ORAL_TABLET | Freq: Once | ORAL | Status: AC
  Administered 2020-03-17: 1000 mg via ORAL
  Filled 2020-03-17: qty 2

## 2020-03-17 MED ORDER — ORAL CARE MOUTH RINSE: 15.0000 mL | Freq: Once | OROMUCOSAL | Status: AC

## 2020-03-17 MED ORDER — LIDOCAINE 2% (20 MG/ML) 5 ML SYRINGE
INTRAMUSCULAR | Status: AC
  Filled 2020-03-17: qty 5

## 2020-03-17 MED ORDER — KETOROLAC TROMETHAMINE 30 MG/ML IJ SOLN
INTRAMUSCULAR | Status: AC
  Filled 2020-03-17: qty 1

## 2020-03-17 MED ORDER — KETOROLAC TROMETHAMINE 30 MG/ML IJ SOLN
INTRAMUSCULAR | Status: DC | PRN
  Administered 2020-03-17: 30 mg

## 2020-03-17 MED ORDER — BUPIVACAINE IN DEXTROSE 0.75-8.25 % IT SOLN: INTRATHECAL | Status: DC | PRN

## 2020-03-17 MED ORDER — LORATADINE 10 MG PO TABS
10.0000 mg | ORAL_TABLET | Freq: Every day | ORAL | Status: DC
  Administered 2020-03-17: 10 mg via ORAL
  Filled 2020-03-17: qty 1

## 2020-03-17 MED ORDER — DOCUSATE SODIUM 100 MG PO CAPS
100.0000 mg | ORAL_CAPSULE | Freq: Two times a day (BID) | ORAL | Status: DC
  Administered 2020-03-17 – 2020-03-18 (×2): 100 mg via ORAL
  Filled 2020-03-17 (×2): qty 1

## 2020-03-17 MED ORDER — BISACODYL 10 MG RE SUPP: 10.0000 mg | Freq: Every day | RECTAL | Status: DC | PRN

## 2020-03-17 MED ORDER — FLUTICASONE PROPIONATE 50 MCG/ACT NA SUSP: 2.0000 | Freq: Every day | NASAL | Status: DC | PRN

## 2020-03-17 MED ORDER — KETOROLAC TROMETHAMINE 15 MG/ML IJ SOLN
7.5000 mg | Freq: Four times a day (QID) | INTRAMUSCULAR | Status: DC
  Administered 2020-03-17 – 2020-03-18 (×3): 7.5 mg via INTRAVENOUS
  Filled 2020-03-17 (×2): qty 1

## 2020-03-17 MED ORDER — LIDOCAINE HCL (CARDIAC) PF 100 MG/5ML IV SOSY
PREFILLED_SYRINGE | INTRAVENOUS | Status: DC | PRN
  Administered 2020-03-17: 100 mg via INTRAVENOUS

## 2020-03-17 MED ORDER — LACTATED RINGERS IV SOLN: INTRAVENOUS | Status: DC

## 2020-03-17 MED ORDER — PHENYLEPHRINE HCL-NACL 10-0.9 MG/250ML-% IV SOLN
INTRAVENOUS | Status: DC | PRN
  Administered 2020-03-17: 35 ug/min via INTRAVENOUS

## 2020-03-17 MED ORDER — PROPOFOL 10 MG/ML IV BOLUS
INTRAVENOUS | Status: DC | PRN
  Administered 2020-03-17 (×2): 10 mg via INTRAVENOUS
  Administered 2020-03-17: 20 mg via INTRAVENOUS
  Administered 2020-03-17: 100 ug/kg/min via INTRAVENOUS

## 2020-03-17 MED ORDER — BUPIVACAINE HCL (PF) 0.25 % IJ SOLN
INTRAMUSCULAR | Status: DC | PRN
  Administered 2020-03-17: 30 mL

## 2020-03-17 MED ORDER — MIDAZOLAM HCL 5 MG/5ML IJ SOLN
INTRAMUSCULAR | Status: DC | PRN
  Administered 2020-03-17: 2 mg via INTRAVENOUS

## 2020-03-17 MED ORDER — CEFAZOLIN SODIUM-DEXTROSE 2-4 GM/100ML-% IV SOLN
2.0000 g | INTRAVENOUS | Status: AC
  Administered 2020-03-17: 2 g via INTRAVENOUS
  Filled 2020-03-17: qty 100

## 2020-03-17 MED ORDER — MEPERIDINE HCL 50 MG/ML IJ SOLN: 6.2500 mg | INTRAMUSCULAR | Status: DC | PRN

## 2020-03-17 MED ORDER — ONDANSETRON HCL 4 MG/2ML IJ SOLN
INTRAMUSCULAR | Status: DC | PRN
  Administered 2020-03-17: 4 mg via INTRAVENOUS

## 2020-03-17 MED ORDER — FENTANYL CITRATE (PF) 100 MCG/2ML IJ SOLN
INTRAMUSCULAR | Status: DC | PRN
  Administered 2020-03-17 (×4): 50 ug via INTRAVENOUS

## 2020-03-17 MED ORDER — KETOROLAC TROMETHAMINE 15 MG/ML IJ SOLN: 15.0000 mg | Freq: Once | INTRAMUSCULAR | Status: AC

## 2020-03-17 MED ORDER — ONDANSETRON HCL 4 MG PO TABS: 4.0000 mg | ORAL_TABLET | Freq: Four times a day (QID) | ORAL | Status: DC | PRN

## 2020-03-17 MED ORDER — TRANEXAMIC ACID-NACL 1000-0.7 MG/100ML-% IV SOLN
1000.0000 mg | INTRAVENOUS | Status: AC
  Administered 2020-03-17: 1000 mg via INTRAVENOUS
  Filled 2020-03-17: qty 100

## 2020-03-17 MED ORDER — POVIDONE-IODINE 10 % EX SWAB
2.0000 "application " | Freq: Once | CUTANEOUS | Status: AC
  Administered 2020-03-17: 2 via TOPICAL

## 2020-03-17 MED ORDER — FENTANYL CITRATE (PF) 100 MCG/2ML IJ SOLN: 25.0000 ug | INTRAMUSCULAR | Status: DC | PRN

## 2020-03-17 MED ORDER — DEXAMETHASONE SODIUM PHOSPHATE 10 MG/ML IJ SOLN
INTRAMUSCULAR | Status: DC | PRN
  Administered 2020-03-17: 10 mg via INTRAVENOUS

## 2020-03-17 MED ORDER — OXYCODONE HCL 5 MG PO TABS
5.0000 mg | ORAL_TABLET | ORAL | Status: DC | PRN
  Administered 2020-03-17 (×3): 10 mg via ORAL
  Filled 2020-03-17 (×4): qty 2

## 2020-03-17 MED ORDER — HYDROMORPHONE HCL 1 MG/ML IJ SOLN: 0.5000 mg | INTRAMUSCULAR | Status: DC | PRN

## 2020-03-17 MED ORDER — MAGNESIUM CITRATE PO SOLN: 1.0000 | Freq: Once | ORAL | Status: DC | PRN

## 2020-03-17 MED ORDER — DIPHENHYDRAMINE HCL 12.5 MG/5ML PO ELIX: 12.5000 mg | ORAL_SOLUTION | ORAL | Status: DC | PRN

## 2020-03-17 MED ORDER — PROMETHAZINE HCL 25 MG/ML IJ SOLN: 6.2500 mg | INTRAMUSCULAR | Status: DC | PRN

## 2020-03-17 MED ORDER — DEXAMETHASONE SODIUM PHOSPHATE 10 MG/ML IJ SOLN
INTRAMUSCULAR | Status: AC
  Filled 2020-03-17: qty 1

## 2020-03-17 MED ORDER — MIDAZOLAM HCL 2 MG/2ML IJ SOLN
INTRAMUSCULAR | Status: AC
  Filled 2020-03-17: qty 2

## 2020-03-17 MED ORDER — ONDANSETRON HCL 4 MG/2ML IJ SOLN
INTRAMUSCULAR | Status: AC
  Filled 2020-03-17: qty 2

## 2020-03-17 MED ORDER — ALUM & MAG HYDROXIDE-SIMETH 200-200-20 MG/5ML PO SUSP
30.0000 mL | ORAL | Status: DC | PRN
  Administered 2020-03-18: 30 mL via ORAL
  Filled 2020-03-17: qty 30

## 2020-03-17 MED ORDER — STERILE WATER FOR IRRIGATION IR SOLN
Status: DC | PRN
  Administered 2020-03-17: 2000 mL

## 2020-03-17 MED ORDER — BUPIVACAINE HCL 0.25 % IJ SOLN
INTRAMUSCULAR | Status: AC
  Filled 2020-03-17: qty 1

## 2020-03-17 MED ORDER — VITAMIN B-12 1000 MCG PO TABS
1000.0000 ug | ORAL_TABLET | Freq: Every day | ORAL | Status: DC
  Administered 2020-03-18: 1000 ug via ORAL
  Filled 2020-03-17: qty 1

## 2020-03-17 MED ORDER — EPHEDRINE 5 MG/ML INJ
INTRAVENOUS | Status: AC
  Filled 2020-03-17: qty 10

## 2020-03-17 MED ORDER — HYDROCHLOROTHIAZIDE 25 MG PO TABS
25.0000 mg | ORAL_TABLET | Freq: Every day | ORAL | Status: DC
  Administered 2020-03-18: 25 mg via ORAL
  Filled 2020-03-17: qty 1

## 2020-03-17 MED ORDER — ASPIRIN EC 325 MG PO TBEC
325.0000 mg | DELAYED_RELEASE_TABLET | Freq: Every day | ORAL | Status: DC
  Administered 2020-03-18: 325 mg via ORAL
  Filled 2020-03-17: qty 1

## 2020-03-17 MED ORDER — MENTHOL 3 MG MT LOZG: 1.0000 | LOZENGE | OROMUCOSAL | Status: DC | PRN

## 2020-03-17 MED ORDER — ACETAMINOPHEN 325 MG PO TABS: 325.0000 mg | ORAL_TABLET | Freq: Four times a day (QID) | ORAL | Status: DC | PRN

## 2020-03-17 MED ORDER — ZOLPIDEM TARTRATE 5 MG PO TABS: 5.0000 mg | ORAL_TABLET | Freq: Every evening | ORAL | Status: DC | PRN

## 2020-03-17 MED ORDER — FENTANYL CITRATE (PF) 100 MCG/2ML IJ SOLN
INTRAMUSCULAR | Status: AC
  Filled 2020-03-17: qty 2

## 2020-03-17 MED ORDER — CEFAZOLIN SODIUM-DEXTROSE 2-4 GM/100ML-% IV SOLN
2.0000 g | Freq: Four times a day (QID) | INTRAVENOUS | Status: AC
  Administered 2020-03-17 (×2): 2 g via INTRAVENOUS
  Filled 2020-03-17 (×2): qty 100

## 2020-03-17 MED ORDER — ONDANSETRON HCL 4 MG/2ML IJ SOLN: 4.0000 mg | Freq: Four times a day (QID) | INTRAMUSCULAR | Status: DC | PRN

## 2020-03-17 MED ORDER — POLYETHYLENE GLYCOL 3350 17 G PO PACK: 17.0000 g | PACK | Freq: Every day | ORAL | Status: DC | PRN

## 2020-03-17 MED ORDER — DEXAMETHASONE SODIUM PHOSPHATE 10 MG/ML IJ SOLN
10.0000 mg | Freq: Once | INTRAMUSCULAR | Status: DC
  Filled 2020-03-17: qty 1

## 2020-03-17 MED ORDER — CITALOPRAM HYDROBROMIDE 20 MG PO TABS
40.0000 mg | ORAL_TABLET | Freq: Every day | ORAL | Status: DC
  Administered 2020-03-17: 40 mg via ORAL
  Filled 2020-03-17: qty 2

## 2020-03-17 MED ORDER — LEVOTHYROXINE SODIUM 88 MCG PO TABS
88.0000 ug | ORAL_TABLET | Freq: Every day | ORAL | Status: DC
  Administered 2020-03-18: 88 ug via ORAL
  Filled 2020-03-17: qty 1

## 2020-03-17 MED ORDER — CHLORHEXIDINE GLUCONATE 0.12 % MT SOLN
15.0000 mL | Freq: Once | OROMUCOSAL | Status: AC
  Administered 2020-03-17: 15 mL via OROMUCOSAL

## 2020-03-17 MED ORDER — POTASSIUM CHLORIDE CRYS ER 20 MEQ PO TBCR
20.0000 meq | EXTENDED_RELEASE_TABLET | Freq: Every day | ORAL | Status: DC
  Administered 2020-03-18: 20 meq via ORAL
  Filled 2020-03-17: qty 1

## 2020-03-17 MED ORDER — LOSARTAN POTASSIUM 50 MG PO TABS: 100.0000 mg | ORAL_TABLET | Freq: Every day | ORAL | Status: DC

## 2020-03-17 MED ORDER — SODIUM CHLORIDE 0.9 % IR SOLN
Status: DC | PRN
  Administered 2020-03-17: 1000 mL

## 2020-03-17 MED ORDER — FUROSEMIDE 20 MG PO TABS: 20.0000 mg | ORAL_TABLET | Freq: Every day | ORAL | Status: DC | PRN

## 2020-03-17 MED ORDER — OXYCODONE HCL 5 MG PO TABS
10.0000 mg | ORAL_TABLET | ORAL | Status: DC | PRN
  Administered 2020-03-18: 10 mg via ORAL

## 2020-03-17 MED ORDER — PHENOL 1.4 % MT LIQD: 1.0000 | OROMUCOSAL | Status: DC | PRN

## 2020-03-17 MED ORDER — BUPIVACAINE IN DEXTROSE 0.75-8.25 % IT SOLN
INTRATHECAL | Status: DC | PRN
  Administered 2020-03-17: 1.6 mL via INTRATHECAL

## 2020-03-17 MED ORDER — METOCLOPRAMIDE HCL 5 MG/ML IJ SOLN: 5.0000 mg | Freq: Three times a day (TID) | INTRAMUSCULAR | Status: DC | PRN

## 2020-03-17 MED ORDER — BUPROPION HCL ER (XL) 300 MG PO TB24
300.0000 mg | ORAL_TABLET | Freq: Every day | ORAL | Status: DC
  Administered 2020-03-18: 300 mg via ORAL
  Filled 2020-03-17: qty 1

## 2020-03-17 MED ORDER — METOCLOPRAMIDE HCL 5 MG PO TABS: 5.0000 mg | ORAL_TABLET | Freq: Three times a day (TID) | ORAL | Status: DC | PRN

## 2020-03-17 MED ORDER — ACETAMINOPHEN 10 MG/ML IV SOLN: 1000.0000 mg | Freq: Once | INTRAVENOUS | Status: DC | PRN

## 2020-03-17 MED ORDER — PHENYLEPHRINE HCL (PRESSORS) 10 MG/ML IV SOLN
INTRAVENOUS | Status: AC
  Filled 2020-03-17: qty 1

## 2020-03-17 MED ORDER — ACETAMINOPHEN 500 MG PO TABS
1000.0000 mg | ORAL_TABLET | Freq: Four times a day (QID) | ORAL | Status: DC
  Administered 2020-03-17 – 2020-03-18 (×3): 1000 mg via ORAL
  Filled 2020-03-17 (×2): qty 2

## 2020-03-17 SURGICAL SUPPLY — 58 items
BALL HIP ARTICU EZE 36 8.5 (Hips) IMPLANT
BIT DRILL 2.0X128 (BIT) ×2 IMPLANT
BLADE SAW SAG 73X25 THK (BLADE) ×1
BLADE SAW SGTL 73X25 THK (BLADE) ×1 IMPLANT
CLSR STERI-STRIP ANTIMIC 1/2X4 (GAUZE/BANDAGES/DRESSINGS) ×2 IMPLANT
COVER SURGICAL LIGHT HANDLE (MISCELLANEOUS) ×2 IMPLANT
COVER WAND RF STERILE (DRAPES) IMPLANT
DRAPE INCISE IOBAN 66X45 STRL (DRAPES) ×2 IMPLANT
DRAPE ORTHO SPLIT 77X108 STRL (DRAPES) ×4
DRAPE POUCH INSTRU U-SHP 10X18 (DRAPES) ×2 IMPLANT
DRAPE SHEET LG 3/4 BI-LAMINATE (DRAPES) ×2 IMPLANT
DRAPE SURG 17X11 SM STRL (DRAPES) ×2 IMPLANT
DRAPE SURG ORHT 6 SPLT 77X108 (DRAPES) ×2 IMPLANT
DRAPE U-SHAPE 47X51 STRL (DRAPES) ×2 IMPLANT
DRSG MEPILEX BORDER 4X12 (GAUZE/BANDAGES/DRESSINGS) ×2 IMPLANT
DRSG MEPILEX BORDER 4X8 (GAUZE/BANDAGES/DRESSINGS) ×2 IMPLANT
DURAPREP 26ML APPLICATOR (WOUND CARE) ×4 IMPLANT
ELECT BLADE TIP CTD 4 INCH (ELECTRODE) ×2 IMPLANT
ELECT REM PT RETURN 15FT ADLT (MISCELLANEOUS) ×2 IMPLANT
ELIMINATOR HOLE APEX DEPUY (Hips) ×1 IMPLANT
FACESHIELD WRAPAROUND (MASK) ×2 IMPLANT
GLOVE BIO SURGEON STRL SZ7 (GLOVE) ×2 IMPLANT
GLOVE BIO SURGEON STRL SZ7.5 (GLOVE) ×2 IMPLANT
GLOVE BIOGEL PI IND STRL 7.0 (GLOVE) ×1 IMPLANT
GLOVE BIOGEL PI IND STRL 8 (GLOVE) ×1 IMPLANT
GLOVE BIOGEL PI INDICATOR 7.0 (GLOVE) ×1
GLOVE BIOGEL PI INDICATOR 8 (GLOVE) ×1
GOWN STRL REUS W/TWL LRG LVL3 (GOWN DISPOSABLE) ×4 IMPLANT
HIP BALL ARTICU EZE 36 8.5 (Hips) ×2 IMPLANT
HOOD PEEL AWAY FLYTE STAYCOOL (MISCELLANEOUS) ×6 IMPLANT
KIT BASIN OR (CUSTOM PROCEDURE TRAY) ×1 IMPLANT
KIT TURNOVER KIT A (KITS) ×2 IMPLANT
LINER NEUTRAL 52X36MM PLUS 4 (Liner) ×1 IMPLANT
MANIFOLD NEPTUNE II (INSTRUMENTS) ×2 IMPLANT
NDL SAFETY ECLIPSE 18X1.5 (NEEDLE) ×2 IMPLANT
NEEDLE HYPO 18GX1.5 SHARP (NEEDLE) ×4
NS IRRIG 1000ML POUR BTL (IV SOLUTION) ×2 IMPLANT
PACK TOTAL JOINT (CUSTOM PROCEDURE TRAY) ×2 IMPLANT
PENCIL SMOKE EVACUATOR (MISCELLANEOUS) IMPLANT
PIN SECTOR W/GRIP ACE CUP 52MM (Hips) ×1 IMPLANT
PROTECTOR NERVE ULNAR (MISCELLANEOUS) ×2 IMPLANT
RETRIEVER SUT HEWSON (MISCELLANEOUS) ×2 IMPLANT
SCREW 6.5MMX25MM (Screw) ×2 IMPLANT
STEM FEM DF STD 44X38 SZ8 130D (Stem) ×1 IMPLANT
SUCTION FRAZIER HANDLE 12FR (TUBING) ×2
SUCTION TUBE FRAZIER 12FR DISP (TUBING) ×1 IMPLANT
SUT FIBERWIRE #2 38 REV NDL BL (SUTURE) ×6
SUT VIC AB 0 CT1 36 (SUTURE) ×2 IMPLANT
SUT VIC AB 1 CT1 36 (SUTURE) ×4 IMPLANT
SUT VIC AB 2-0 CT1 27 (SUTURE) ×4
SUT VIC AB 2-0 CT1 TAPERPNT 27 (SUTURE) ×2 IMPLANT
SUT VIC AB 3-0 SH 8-18 (SUTURE) ×2 IMPLANT
SUTURE FIBERWR#2 38 REV NDL BL (SUTURE) ×3 IMPLANT
SYR CONTROL 10ML LL (SYRINGE) ×4 IMPLANT
TOWEL OR 17X26 10 PK STRL BLUE (TOWEL DISPOSABLE) ×2 IMPLANT
TRAY FOLEY MTR SLVR 14FR STAT (SET/KITS/TRAYS/PACK) ×2 IMPLANT
WATER STERILE IRR 1000ML POUR (IV SOLUTION) ×4 IMPLANT
YANKAUER SUCT BULB TIP 10FT TU (MISCELLANEOUS) ×2 IMPLANT

## 2020-03-17 NOTE — Anesthesia Procedure Notes (Signed)
Spinal  Patient location during procedure: OR Start time: 03/17/2020 7:40 AM End time: 03/17/2020 7:42 AM Staffing Performed: anesthesiologist  Anesthesiologist: Lyn Hollingshead, MD Preanesthetic Checklist Completed: patient identified, IV checked, site marked, risks and benefits discussed, surgical consent, monitors and equipment checked, pre-op evaluation and timeout performed Spinal Block Patient position: sitting Prep: DuraPrep and site prepped and draped Patient monitoring: continuous pulse ox and blood pressure Approach: midline Location: L3-4 Injection technique: single-shot Needle Needle type: Pencan  Needle gauge: 24 G Needle length: 10 cm Needle insertion depth: 5 cm Assessment Sensory level: T8

## 2020-03-17 NOTE — Discharge Instructions (Signed)
INSTRUCTIONS AFTER JOINT REPLACEMENT   o Remove items at home which could result in a fall. This includes throw rugs or furniture in walking pathways o ICE to the affected joint every three hours while awake for 30 minutes at a time, for at least the first 3-5 days, and then as needed for pain and swelling.  Continue to use ice for pain and swelling. You may notice swelling that will progress down to the foot and ankle.  This is normal after surgery.  Elevate your leg when you are not up walking on it.   o Continue to use the breathing machine you got in the hospital (incentive spirometer) which will help keep your temperature down.  It is common for your temperature to cycle up and down following surgery, especially at night when you are not up moving around and exerting yourself.  The breathing machine keeps your lungs expanded and your temperature down.   DIET:  As you were doing prior to hospitalization, we recommend a well-balanced diet.  DRESSING / WOUND CARE / SHOWERING  You may change your dressing 3-5 days after surgery.  Then change the dressing every day with sterile gauze.  Please use good hand washing techniques before changing the dressing.  Do not use any lotions or creams on the incision until instructed by your surgeon.  ACTIVITY  o Increase activity slowly as tolerated, but follow the weight bearing instructions below.   o No driving for 6 weeks or until further direction given by your physician.  You cannot drive while taking narcotics.  o No lifting or carrying greater than 10 lbs. until further directed by your surgeon. o Avoid periods of inactivity such as sitting longer than an hour when not asleep. This helps prevent blood clots.  o You may return to work once you are authorized by your doctor.     WEIGHT BEARING   Weight bearing as tolerated with assist device (walker, cane, etc) as directed, use it as long as suggested by your surgeon or therapist, typically at  least 4-6 weeks.   EXERCISES  Results after joint replacement surgery are often greatly improved when you follow the exercise, range of motion and muscle strengthening exercises prescribed by your doctor. Safety measures are also important to protect the joint from further injury. Any time any of these exercises cause you to have increased pain or swelling, decrease what you are doing until you are comfortable again and then slowly increase them. If you have problems or questions, call your caregiver or physical therapist for advice.   Rehabilitation is important following a joint replacement. After just a few days of immobilization, the muscles of the leg can become weakened and shrink (atrophy).  These exercises are designed to build up the tone and strength of the thigh and leg muscles and to improve motion. Often times heat used for twenty to thirty minutes before working out will loosen up your tissues and help with improving the range of motion but do not use heat for the first two weeks following surgery (sometimes heat can increase post-operative swelling).   These exercises can be done on a training (exercise) mat, on the floor, on a table or on a bed. Use whatever works the best and is most comfortable for you.    Use music or television while you are exercising so that the exercises are a pleasant break in your day. This will make your life better with the exercises acting as a break   in your routine that you can look forward to.   Perform all exercises about fifteen times, three times per day or as directed.  You should exercise both the operative leg and the other leg as well.  Exercises include:   . Quad Sets - Tighten up the muscle on the front of the thigh (Quad) and hold for 5-10 seconds.   . Straight Leg Raises - With your knee straight (if you were given a brace, keep it on), lift the leg to 60 degrees, hold for 3 seconds, and slowly lower the leg.  Perform this exercise against  resistance later as your leg gets stronger.  . Leg Slides: Lying on your back, slowly slide your foot toward your buttocks, bending your knee up off the floor (only go as far as is comfortable). Then slowly slide your foot back down until your leg is flat on the floor again.  . Angel Wings: Lying on your back spread your legs to the side as far apart as you can without causing discomfort.  . Hamstring Strength:  Lying on your back, push your heel against the floor with your leg straight by tightening up the muscles of your buttocks.  Repeat, but this time bend your knee to a comfortable angle, and push your heel against the floor.  You may put a pillow under the heel to make it more comfortable if necessary.   A rehabilitation program following joint replacement surgery can speed recovery and prevent re-injury in the future due to weakened muscles. Contact your doctor or a physical therapist for more information on knee rehabilitation.    CONSTIPATION  Constipation is defined medically as fewer than three stools per week and severe constipation as less than one stool per week.  Even if you have a regular bowel pattern at home, your normal regimen is likely to be disrupted due to multiple reasons following surgery.  Combination of anesthesia, postoperative narcotics, change in appetite and fluid intake all can affect your bowels.   YOU MUST use at least one of the following options; they are listed in order of increasing strength to get the job done.  They are all available over the counter, and you may need to use some, POSSIBLY even all of these options:    Drink plenty of fluids (prune juice may be helpful) and high fiber foods Colace 100 mg by mouth twice a day  Senokot for constipation as directed and as needed Dulcolax (bisacodyl), take with full glass of water  Miralax (polyethylene glycol) once or twice a day as needed.  If you have tried all these things and are unable to have a bowel  movement in the first 3-4 days after surgery call either your surgeon or your primary doctor.    If you experience loose stools or diarrhea, hold the medications until you stool forms back up.  If your symptoms do not get better within 1 week or if they get worse, check with your doctor.  If you experience "the worst abdominal pain ever" or develop nausea or vomiting, please contact the office immediately for further recommendations for treatment.   ITCHING:  If you experience itching with your medications, try taking only a single pain pill, or even half a pain pill at a time.  You can also use Benadryl over the counter for itching or also to help with sleep.   TED HOSE STOCKINGS:  Use stockings on both legs until for at least 2 weeks or as   directed by physician office. They may be removed at night for sleeping.  MEDICATIONS:  See your medication summary on the "After Visit Summary" that nursing will review with you.  You may have some home medications which will be placed on hold until you complete the course of blood thinner medication.  It is important for you to complete the blood thinner medication as prescribed.  PRECAUTIONS:  If you experience chest pain or shortness of breath - call 911 immediately for transfer to the hospital emergency department.   If you develop a fever greater that 101 F, purulent drainage from wound, increased redness or drainage from wound, foul odor from the wound/dressing, or calf pain - CONTACT YOUR SURGEON.                                                   FOLLOW-UP APPOINTMENTS:  If you do not already have a post-op appointment, please call the office for an appointment to be seen by your surgeon.  Guidelines for how soon to be seen are listed in your "After Visit Summary", but are typically between 1-4 weeks after surgery.  OTHER INSTRUCTIONS:   Knee Replacement:  Do not place pillow under knee, focus on keeping the knee straight while resting. CPM  instructions: 0-90 degrees, 2 hours in the morning, 2 hours in the afternoon, and 2 hours in the evening. Place foam block, curve side up under heel at all times except when in CPM or when walking.  DO NOT modify, tear, cut, or change the foam block in any way.   DENTAL ANTIBIOTICS:  In most cases prophylactic antibiotics for Dental procdeures after total joint surgery are not necessary.  Exceptions are as follows:  1. History of prior total joint infection  2. Severely immunocompromised (Organ Transplant, cancer chemotherapy, Rheumatoid biologic meds such as Humera)  3. Poorly controlled diabetes (A1C &gt; 8.0, blood glucose over 200)  If you have one of these conditions, contact your surgeon for an antibiotic prescription, prior to your dental procedure.   MAKE SURE YOU:  . Understand these instructions.  . Get help right away if you are not doing well or get worse.    Thank you for letting us be a part of your medical care team.  It is a privilege we respect greatly.  We hope these instructions will help you stay on track for a fast and full recovery!   

## 2020-03-17 NOTE — Op Note (Signed)
03/17/2020  10:11 AM  PATIENT:  Regina Berry   MRN: [id number]  PRE-OPERATIVE DIAGNOSIS:  Left hip primary localized osteoarthritis  POST-OPERATIVE DIAGNOSIS:  same  PROCEDURE:  Procedure(s): TOTAL HIP ARTHROPLASTY  PREOPERATIVE INDICATIONS:    Regina Berry is an 69 y.o. female who has a diagnosis of left hip oa and elected for surgical management after failing conservative treatment.  The risks benefits and alternatives were discussed with the patient including but not limited to the risks of nonoperative treatment, versus surgical intervention including infection, bleeding, nerve injury, periprosthetic fracture, the need for revision surgery, dislocation, leg length discrepancy, blood clots, cardiopulmonary complications, morbidity, mortality, among others, and they were willing to proceed.     OPERATIVE REPORT     SURGEON:  Marchia Bond, MD    ASSISTANT:  Merlene Pulling, PA-C, (Present throughout the entire procedure,  necessary for completion of procedure in a timely manner, assisting with retraction, instrumentation, and closure)     ANESTHESIA:  spinal  ESTIMATED BLOOD LOSS: 989QJ    COMPLICATIONS:  None.     UNIQUE ASPECTS OF THE CASE: Advanced eburnation of the femoral head.  She was fairly flexible during the prepping portion, external rotation was quite easy.  The femoral neck was fairly long, the head measured to a 49.  I had a full thumbs breath above the lesser trochanter with the femoral neck cut, although interestingly after the trials were in, she had a fair amount of shuck.  She was stable posteriorly, and did not impinge anteriorly.  Her leg lengths clinically felt equal at the end of the case, although I was debating whether there was too too much shuck.  Nonetheless I felt like I had a good reconstruction with good stability and restoration of leg lengths, so I stuck with the 8.5 femoral head.  The version on the cup matched posteriorly, and left about 3 or 4 mm  of bone anteriorly exposed so I felt like my version was appropriate.  COMPONENTS:  [employer] fit femur size 8 with a 36 mm + 8.5 metallic head ball and a Gription Acetabular shell size 52, with a single cancellous screw for backup fixation, with an apex hole eliminator and a +4 neutral polyethylene liner.    PROCEDURE IN DETAIL:   The patient was met in the holding area and  identified.  The appropriate hip was identified and marked at the operative site.  The patient was then transported to the OR  and  placed under anesthesia.  At that point, the patient was  placed in the lateral decubitus position with the operative side up and  secured to the operating room table and all bony prominences padded.     The operative lower extremity was prepped from the iliac crest to the distal leg.  Sterile draping was performed.  Time out was performed prior to incision.      A routine posterolateral approach was utilized via sharp dissection  carried down to the subcutaneous tissue.  Gross bleeders were Bovie coagulated.  The iliotibial band was identified and incised along the length of the skin incision.  Self-retaining retractors were  inserted.  With the hip internally rotated, the short external rotators  were identified. The piriformis and capsule was tagged with FiberWire, and the hip capsule released in a T-type fashion.  The femoral neck was exposed, and I resected the femoral neck using the appropriate jig. This was performed at approximately a thumb's breadth above  the lesser trochanter.    I then exposed the deep acetabulum, cleared out any tissue including the ligamentum teres.  A wing retractor was placed.  After adequate visualization, I excised the labrum, and then sequentially reamed.  I placed the trial acetabulum, which seated nicely, and then impacted the real cup into place.  Appropriate version and inclination was confirmed clinically matching their bony anatomy, and also  with the use of the jig.  I placed a cancellous screw to augment fixation.  A trial polyethylene liner was placed and the wing retractor removed.    I then prepared the proximal femur using the cookie-cutter, the lateralizing reamer, and then sequentially reamed and broached.  A trial broach, neck, and head was utilized, and I reduced the hip and it was found to have excellent stability with functional range of motion. The trial components were then removed, and the real polyethylene liner was placed.  I then impacted the real femoral prosthesis into place into the appropriate version, slightly anteverted to the normal anatomy, and I impacted the real head ball into place. The hip was then reduced and taken through functional range of motion and found to have excellent stability. Leg lengths were restored.  I then used a 2 mm drill bits to pass the FiberWire suture from the capsule and piriformis through the greater trochanter, and secured this. Excellent posterior capsular repair was achieved. I also closed the T in the capsule.  I then irrigated the hip copiously again with pulse lavage, and repaired the fascia with Vicryl, followed by Vicryl for the subcutaneous tissue, Monocryl for the skin, Steri-Strips and sterile gauze. The wounds were injected. The patient was then awakened and returned to PACU in stable and satisfactory condition. There were no complications.  Marchia Bond, MD Orthopedic Surgeon [phone]   03/17/2020 10:11 AM

## 2020-03-17 NOTE — H&P (Signed)
PREOPERATIVE H&P  Chief Complaint: left hip pain  HPI: Regina Berry is a 69 y.o. female who presents for preoperative history and physical with a diagnosis of left hip oa. Symptoms are rated as moderate to severe, and have been worsening.  This is significantly impairing activities of daily living.  She has elected for surgical management.   She has failed injections, activity modification, anti-inflammatories, and assistive devices.  Preoperative X-rays demonstrate end stage degenerative changes with osteophyte formation, loss of joint space, subchondral sclerosis.   Past Medical History:  Diagnosis Date  . Anemia   . Anxiety   . Arthritis    Lt hip  . Cancer ([place]) 2020   skin  Rt leg   . Chronic bronchitis ([place])   . COPD (chronic obstructive pulmonary disease) ([place])   . Dyspnea   . GERD (gastroesophageal reflux disease)   . Hypertension   . Hypothyroidism   . Obesity (BMI 30-39.9) 12/21/2017  . OSA on CPAP 12/21/2017   Severe with AHI 29.4/h and oxygen desaturations as low as 75%.  She is now on CPAP at 6 cm water pressure with 2 L oxygen due to nocturnal hypoxemia  . Pelvic pressure in female 01/23/2014  . Positive fecal occult blood test 06/23/2015  . Rectocele 01/23/2014  . 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 Thyroid disease    Past Surgical History:  Procedure Laterality Date  . CESAREAN SECTION    . COLONOSCOPY N/A 09/11/2015   Procedure: COLONOSCOPY;  Surgeon: Rogene Houston, MD;  Location: [facility] SUITE;  Service: Endoscopy;  Laterality: N/A;  moved to 1/6 @ 1:35 - Ann to notify pt  . EYE SURGERY Bilateral 2021  . LUNG SURGERY Left    infection on outside of lung that had to be"scaped off".  Tildon Husky REPAIR N/A 01/02/2018   Procedure: POSTERIOR REPAIR (RECTOCELE);  Surgeon: Jonnie Kind, MD;  Location: [facility] ORS;  Service: Gynecology;  Laterality: N/A;   Social History   Socioeconomic History  . Marital status: Married    Spouse name: Not on file  . Number of  children: Not on file  . Years of education: Not on file  . Highest education level: Not on file  Occupational History  . Not on file  Tobacco Use  . Smoking status: Current Every Day Smoker    Packs/day: 1.00    Years: 40.00    Pack years: 40.00    Types: Cigarettes  . Smokeless tobacco: Never Used  Vaping Use  . Vaping Use: Never assessed  Substance and Sexual Activity  . Alcohol use: Yes    Alcohol/week: 0.0 standard drinks    Comment: occasionally  . Drug use: No  . Sexual activity: Not Currently    Birth control/protection: Post-menopausal  Other Topics Concern  . Not on file  Social History Narrative  . Not on file   Social Determinants of Health   Financial Resource Strain:   . Difficulty of Paying Living Expenses:   Food Insecurity:   . Worried About Charity fundraiser in the Last Year:   . Arboriculturist in the Last Year:   Transportation Needs:   . Film/video editor (Medical):   Marland Kitchen Lack of Transportation (Non-Medical):   Physical Activity:   . Days of Exercise per Week:   . Minutes of Exercise per Session:   Stress:   . Feeling of Stress :   Social Connections:   . Frequency of Communication with  Friends and Family:   . Frequency of Social Gatherings with Friends and Family:   . Attends Religious Services:   . Active Member of Clubs or Organizations:   . Attends Archivist Meetings:   Marland Kitchen Marital Status:    Family History  Problem Relation Age of Onset  . Diabetes Mother   . Cancer Mother        ovarian  . COPD Mother   . Diabetes Father   . Cancer Father        lymphoma  . Heart disease Father   . Other Daughter        transverse mylitis   Allergies  Allergen Reactions  . Codeine Itching   Prior to Admission medications   Medication Sig Start Date End Date Taking? Authorizing Provider  aspirin EC 81 MG tablet Take 81 mg daily by mouth.   Yes [provider]  buPROPion (WELLBUTRIN XL) 300 MG 24 hr tablet Take 300 mg  by mouth daily. 03/13/19  Yes [provider]  Calcium-Magnesium-Zinc (CAL-MAG-ZINC PO) Take 1 tablet by mouth daily.   Yes [provider]  citalopram (CELEXA) 40 MG tablet Take 40 mg by mouth at bedtime.   Yes [provider]  furosemide (LASIX) 20 MG tablet Take 20 mg by mouth daily as needed (fluid retention.).    Yes [provider]  HYDROcodone-acetaminophen (NORCO) 10-325 MG tablet Take 1 tablet by mouth every 6 (six) hours as needed (pain.).   Yes [provider]  ibuprofen (ADVIL,MOTRIN) 200 MG tablet Take 600 mg by mouth every 8 (eight) hours as needed (for pain/headaches.).   Yes [provider]  levocetirizine (XYZAL) 5 MG tablet Take 5 mg by mouth at bedtime.    Yes [provider]  levothyroxine (SYNTHROID) 88 MCG tablet Take 88 mcg by mouth daily before breakfast.    Yes [provider]  losartan-hydrochlorothiazide (HYZAAR) 100-25 MG tablet Take 1 tablet by mouth daily.    Yes [provider]  Omega-3 Fatty Acids (FISH OIL) 1000 MG CAPS Take 1,000 mg by mouth daily.   Yes [provider]  potassium chloride SA (K-DUR) 20 MEQ tablet Take 20 mEq by mouth daily.    Yes [provider]  vitamin B-12 (CYANOCOBALAMIN) 1000 MCG tablet Take 1,000 mcg by mouth daily.   Yes [provider]  zolpidem (AMBIEN) 10 MG tablet Take 10 mg by mouth at bedtime as needed for sleep.    Yes [provider]  fluticasone (FLONASE) 50 MCG/ACT nasal spray Place 2 sprays into both nostrils daily as needed (allergies.).     [provider]  polyethylene glycol powder (MIRALAX) powder Take 17 g by mouth daily. To prevent constipation Patient taking differently: Take 17 g by mouth daily as needed. To prevent constipation 01/02/18   Jonnie Kind, MD  predniSONE (STERAPRED UNI-PAK 21 TAB) 10 MG (21) TBPK tablet Take by mouth daily. Take 6 tabs by mouth daily  for 2 days, then 5 tabs for 2  days, then 4 tabs for 2 days, then 3 tabs for 2 days, 2 tabs for 2 days, then 1 tab by mouth daily for 2 days Patient not taking: Reported on 02/25/2020 01/04/20   Stacey Drain, [place], PA-C     Positive ROS: All other systems have been reviewed and were otherwise negative with the exception of those mentioned in the HPI and as above.  Physical Exam: General: Alert, no acute distress Cardiovascular: No pedal edema  Respiratory: No cyanosis, no use of accessory musculature GI: No organomegaly, abdomen is soft and non-tender Skin: No lesions in the area of chief complaint Neurologic: Sensation intact distally Psychiatric: Patient is competent for consent with normal mood and affect Lymphatic: No axillary or cervical lymphadenopathy  MUSCULOSKELETAL: left hip painful arc   Assessment: Left hip oa   Plan: Plan for Procedure(s): TOTAL HIP ARTHROPLASTY  The risks benefits and alternatives were discussed with the patient including but not limited to the risks of nonoperative treatment, versus surgical intervention including infection, bleeding, nerve injury,  blood clots, cardiopulmonary complications, morbidity, mortality, among others, and they were willing to proceed.    Patient's anticipated LOS is less than 2 midnights, meeting these requirements: - Younger than 64 - Lives within 1 hour of care - Has a competent adult at home to recover with post-op recover - NO history of  - Chronic pain requiring opiods  - Diabetes  - Coronary Artery Disease  - Heart failure  - Heart attack  - Stroke  - DVT/VTE  - Cardiac arrhythmia  - Respiratory Failure/COPD  - Renal failure  - Anemia  - Advanced Liver disease        Johnny Bridge, MD Cell [phone]   03/17/2020 7:24 AM

## 2020-03-17 NOTE — Anesthesia Postprocedure Evaluation (Signed)
Anesthesia Post Note  Patient: Regina Berry  Procedure(s) Performed: TOTAL HIP ARTHROPLASTY (Left Hip)     Patient location during evaluation: PACU Anesthesia Type: Spinal Level of consciousness: awake Pain management: pain level controlled Vital Signs Assessment: post-procedure vital signs reviewed and stable Respiratory status: spontaneous breathing Cardiovascular status: stable Postop Assessment: no headache, no backache, spinal receding, patient able to bend at knees and no apparent nausea or vomiting Anesthetic complications: no   No complications documented.  Last Vitals:  Vitals:   03/17/20 1045 03/17/20 1100  BP:  (P) 113/60  Pulse: 61   Resp: 17   Temp:    SpO2: 100%     Last Pain:  Vitals:   03/17/20 1045  TempSrc:   PainSc: 0-No pain                 Huston Foley

## 2020-03-17 NOTE — Transfer of Care (Signed)
Immediate Anesthesia Transfer of Care Note  Patient: Regina Berry  Procedure(s) Performed: TOTAL HIP ARTHROPLASTY (Left Hip)  Patient Location: PACU  Anesthesia Type:Spinal  Level of Consciousness: awake, alert , oriented and patient cooperative  Airway & Oxygen Therapy: Patient Spontanous Breathing and Patient connected to face mask oxygen  Post-op Assessment: Report given to RN and Post -op Vital signs reviewed and stable  Post vital signs: Reviewed and stable  Last Vitals:  Vitals Value Taken Time  BP 104/50 03/17/20 1024  Temp    Pulse 63 03/17/20 1026  Resp 18 03/17/20 1026  SpO2 100 % 03/17/20 1026  Vitals shown include unvalidated device data.  Last Pain:  Vitals:   03/17/20 1022  TempSrc:   PainSc: (P) 0-No pain      Patients Stated Pain Goal: 6 (07/61/51 8343)  Complications: No complications documented.

## 2020-03-17 NOTE — Evaluation (Signed)
Physical Therapy Evaluation Patient Details Name: Regina Berry MRN: [id number] DOB: 1951-02-06 Today's Date: 03/17/2020   History of Present Illness  Patient is 69 y.o. female s/p Lt THA posterolateral approach on 03/17/20 with PMH significant for HTN, COPD, GERD, OA, anxiety, hypothyroidism.    Clinical Impression  Regina Berry is a 69 y.o. female POD 0 s/p Lt THA. Patient reports independence with mobility at baseline. Patient is now limited by functional impairments (see PT problem list below) and requires min-mod assist for transfers and gait with RW. Patient was able to ambulate ~40 feet with RW and min assist. Patient instructed in exercise to facilitate ROM/strength and circulation. Patient will benefit from continued skilled PT interventions to address impairments and progress towards PLOF. Acute PT will follow to progress mobility and stair training in preparation for safe discharge home.     Follow Up Recommendations Follow surgeon's recommendation for DC plan and follow-up therapies;Home health PT    Equipment Recommendations  Rolling walker with 5" wheels;3in1 (PT)    Recommendations for Other Services       Precautions / Restrictions Precautions Precautions: Fall;Posterior Hip Precaution Booklet Issued: Yes (comment) Restrictions Weight Bearing Restrictions: No Other Position/Activity Restrictions: WBAT      Mobility  Bed Mobility Overal bed mobility: Needs Assistance Bed Mobility: Supine to Sit     Supine to sit: Min assist;HOB elevated     General bed mobility comments: cues for sequencing and assist to moblize Lt LE and raise trunk safely to maintain post hip precautions.  Transfers Overall transfer level: Needs assistance Equipment used: Rolling walker (2 wheeled) Transfers: Sit to/from Stand Sit to Stand: Min assist;Mod assist         General transfer comment: cues for to maintain post hip precautions and prvent hip flexion past 90*. min-mod  assist to complete power up.  Ambulation/Gait Ambulation/Gait assistance: Min assist Gait Distance (Feet): 40 Feet Assistive device: Rolling walker (2 wheeled) Gait Pattern/deviations: Step-to pattern;Decreased stride length;Decreased stance time - left;Decreased weight shift to left Gait velocity: decr   General Gait Details: cues for safe step pattern and proximity in RW. VC's for small side steps with turn to prevent IR on Lt hip. no overt LOB noted.  Stairs       Wheelchair Mobility    Modified Rankin (Stroke Patients Only)       Balance Overall balance assessment: Needs assistance Sitting-balance support: Feet supported Sitting balance-Leahy Scale: Good     Standing balance support: During functional activity;Bilateral upper extremity supported Standing balance-Leahy Scale: Fair              Pertinent Vitals/Pain Pain Assessment: 0-10 Pain Score: 5  Pain Location: Lt hip Pain Descriptors / Indicators: Aching;Discomfort Pain Intervention(s): Limited activity within patient's tolerance;Monitored during session;Repositioned;Patient requesting pain meds-RN notified;RN gave pain meds during session;Ice applied    Home Living Family/patient expects to be discharged to:: Private residence Living Arrangements: Spouse/significant other Available Help at Discharge: Family Type of Home: House Home Access: Stairs to enter Entrance Stairs-Rails: None Technical brewer of Steps: 2-3+1 Home Layout: One level Home Equipment: Cane - single point      Prior Function Level of Independence: Independent               Hand Dominance   Dominant Hand: Right    Extremity/Trunk Assessment   Upper Extremity Assessment Upper Extremity Assessment: Overall WFL for tasks assessed    Lower Extremity Assessment Lower Extremity Assessment: Overall WFL for tasks  assessed;LLE deficits/detail LLE Deficits / Details: limited testing due to pain and precautions, 4/5  grossly LLE Sensation: WNL LLE Coordination: WNL    Cervical / Trunk Assessment Cervical / Trunk Assessment: Normal  Communication   Communication: No difficulties  Cognition Arousal/Alertness: Awake/alert Behavior During Therapy: WFL for tasks assessed/performed Overall Cognitive Status: Within Functional Limits for tasks assessed             General Comments      Exercises Total Joint Exercises Ankle Circles/Pumps: AROM;Both;20 reps;Seated Quad Sets: AROM;Left;10 reps;Seated   Assessment/Plan    PT Assessment Patient needs continued PT services  PT Problem List Decreased strength;Decreased range of motion;Decreased activity tolerance;Decreased balance;Decreased mobility;Decreased knowledge of use of DME;Decreased knowledge of precautions;Pain       PT Treatment Interventions DME instruction;Gait training;Stair training;Functional mobility training;Therapeutic activities;Balance training;Therapeutic exercise;Patient/family education    PT Goals (Current goals can be found in the Care Plan section)  Acute Rehab PT Goals Patient Stated Goal: recover with therapy and get back to independence PT Goal Formulation: With patient Time For Goal Achievement: 03/24/20 Potential to Achieve Goals: Good    Frequency 7X/week    AM-PAC PT "6 Clicks" Mobility  Outcome Measure Help needed turning from your back to your side while in a flat bed without using bedrails?: A Little Help needed moving from lying on your back to sitting on the side of a flat bed without using bedrails?: A Little Help needed moving to and from a bed to a chair (including a wheelchair)?: A Lot Help needed standing up from a chair using your arms (e.g., wheelchair or bedside chair)?: A Lot Help needed to walk in hospital room?: A Little Help needed climbing 3-5 steps with a railing? : A Little 6 Click Score: 16    End of Session Equipment Utilized During Treatment: Gait belt Activity Tolerance: Patient  tolerated treatment well Patient left: in chair;with call bell/phone within reach;with chair alarm set;with family/visitor present Nurse Communication: Mobility status PT Visit Diagnosis: Muscle weakness (generalized) (M62.81);Difficulty in walking, not elsewhere classified (R26.2)    Time: 6962-9528 PT Time Calculation (min) (ACUTE ONLY): 30 min   Charges:   PT Evaluation $PT Eval Low Complexity: 1 Low PT Treatments $Gait Training: 8-22 mins       Verner Mould, DPT Acute Rehabilitation Services  Office [phone] Pager [phone]  03/17/2020 4:09 PM

## 2020-03-18 ENCOUNTER — Encounter (HOSPITAL_COMMUNITY): Payer: Self-pay | Admitting: Orthopedic Surgery

## 2020-03-18 DIAGNOSIS — Z96642 Presence of left artificial hip joint: Secondary | ICD-10-CM | POA: Diagnosis not present

## 2020-03-18 DIAGNOSIS — M1612 Unilateral primary osteoarthritis, left hip: Secondary | ICD-10-CM | POA: Diagnosis not present

## 2020-03-18 LAB — BASIC METABOLIC PANEL
Anion gap: 10 (ref 5–15)
BUN: 17 mg/dL (ref 8–23)
CO2: 29 mmol/L (ref 22–32)
Calcium: 8.8 mg/dL — ABNORMAL LOW (ref 8.9–10.3)
Chloride: 98 mmol/L (ref 98–111)
Creatinine, Ser: 0.9 mg/dL (ref 0.44–1.00)
GFR calc Af Amer: >60 mL/min (ref >60)
GFR calc non Af Amer: >60 mL/min (ref >60)
Glucose, Bld: 158 mg/dL — ABNORMAL HIGH (ref 70–99)
Potassium: 3.7 mmol/L (ref 3.5–5.1)
Sodium: 137 mmol/L (ref 135–145)

## 2020-03-18 LAB — CBC
HCT: 31.9 % — ABNORMAL LOW (ref 36.0–46.0)
Hemoglobin: 10.3 g/dL — ABNORMAL LOW (ref 12.0–15.0)
MCH: 29.5 pg (ref 26.0–34.0)
MCHC: 32.3 g/dL (ref 30.0–36.0)
MCV: 91.4 fL (ref 80.0–100.0)
Platelets: 171 10*3/uL (ref 150–400)
RBC: 3.49 MIL/uL — ABNORMAL LOW (ref 3.87–5.11)
RDW: 13.6 % (ref 11.5–15.5)
WBC: 11.4 10*3/uL — ABNORMAL HIGH (ref 4.0–10.5)
nRBC: 0 % (ref 0.0–0.2)

## 2020-03-18 MED ORDER — OXYCODONE HCL 5 MG PO TABS: 5.0000 mg | ORAL_TABLET | ORAL | 0 refills | Status: DC | PRN

## 2020-03-18 MED ORDER — SENNA-DOCUSATE SODIUM 8.6-50 MG PO TABS
2.0000 | ORAL_TABLET | Freq: Every day | ORAL | 1 refills | Status: DC
Start: 2020-03-18 — End: 2020-05-17

## 2020-03-18 MED ORDER — BACLOFEN 10 MG PO TABS
10.0000 mg | ORAL_TABLET | Freq: Three times a day (TID) | ORAL | 0 refills | Status: DC
Start: 2020-03-18 — End: 2020-05-17

## 2020-03-18 MED ORDER — ONDANSETRON HCL 4 MG PO TABS: 4.0000 mg | ORAL_TABLET | Freq: Three times a day (TID) | ORAL | 0 refills | Status: DC | PRN

## 2020-03-18 MED ORDER — ASPIRIN EC 325 MG PO TBEC
325.0000 mg | DELAYED_RELEASE_TABLET | Freq: Two times a day (BID) | ORAL | 0 refills | Status: DC
Start: 2020-03-18 — End: 2020-05-17

## 2020-03-18 MED ORDER — SODIUM CHLORIDE 0.9 % IV BOLUS: 500.0000 mL | Freq: Once | INTRAVENOUS | Status: DC

## 2020-03-18 NOTE — Plan of Care (Signed)
Plan of care reviewed and discussed with the patient. 

## 2020-03-18 NOTE — TOC Transition Note (Signed)
Transition of Care [facility]) - CM/SW Discharge Note   Patient Details  Name: ROCQUEL ASKREN MRN: [id number] Date of Birth: October 15, 1950  [facility] CM/SW Contact:  Lia Hopping, [place] Phone Number: 03/18/2020, 10:56 AM   Clinical Narrative:    [place] [facility] Plan of Care, see CM note.  [employer] delivered RW to pt. Bedside.          Patient Goals and CMS Choice        Discharge Placement                       Discharge Plan and Services                DME Arranged: Walker rolling DME Agency: [employer]       HH Arranged: PT [place] Agency: [facility] (formerly [employer])        Social Determinants of Health ([place]) Interventions     Readmission Risk Interventions No flowsheet data found.

## 2020-03-18 NOTE — Progress Notes (Signed)
Subjective: 1 Day Post-Op s/p Procedure(s): TOTAL HIP ARTHROPLASTY  Patient reports pain as mild this morning, she recently received pain medication. Denies chest pain, SOB, Calf pain. No abdominal pain, nausea/vomiting. No other complaints. Happy with her progress with PT yesterday, she would like to go home today.   Objective:  PE: VITALS:   Vitals:   03/17/20 1959 03/17/20 2235 03/18/20 0158 03/18/20 0552  BP:  116/63 (!) 113/53 (!) 115/52  Pulse: 68 77 66 68  Resp: 17 16 15 16   Temp:  98.3 F (36.8 C) 98.2 F (36.8 C) 98.1 F (36.7 C)  TempSrc:  Oral Oral Oral  SpO2: 96% 96% 97% 98%  Weight:      Height:        ABD soft Neurovascular intact Sensation intact distally Intact pulses distally Dorsiflexion/Plantar flexion intact Incision: dressing C/D/I  LABS  Results for orders placed or performed during the hospital encounter of 03/17/20 (from the past 24 hour(s))  CBC     Status: Abnormal   Collection Time: 03/18/20  3:26 AM  Result Value Ref Range   WBC 11.4 (H) 4.0 - 10.5 K/uL   RBC 3.49 (L) 3.87 - 5.11 MIL/uL   Hemoglobin 10.3 (L) 12.0 - 15.0 g/dL   HCT 31.9 (L) 36 - 46 %   MCV 91.4 80.0 - 100.0 fL   MCH 29.5 26.0 - 34.0 pg   MCHC 32.3 30.0 - 36.0 g/dL   RDW 13.6 11.5 - 15.5 %   Platelets 171 150 - 400 K/uL   nRBC 0.0 0.0 - 0.2 %  Basic metabolic panel     Status: Abnormal   Collection Time: 03/18/20  3:26 AM  Result Value Ref Range   Sodium 137 135 - 145 mmol/L   Potassium 3.7 3.5 - 5.1 mmol/L   Chloride 98 98 - 111 mmol/L   CO2 29 22 - 32 mmol/L   Glucose, Bld 158 (H) 70 - 99 mg/dL   BUN 17 8 - 23 mg/dL   Creatinine, Ser 0.90 0.44 - 1.00 mg/dL   Calcium 8.8 (L) 8.9 - 10.3 mg/dL   GFR calc non Af Amer >60 >60 mL/min   GFR calc Af Amer >60 >60 mL/min   Anion gap 10 5 - 15    DG Pelvis Portable  Result Date: 03/17/2020 CLINICAL DATA:  Status post total hip replacement PORTABLE PELVIS 1-2 VIEWS EXAM: PORTABLE PELVIS AND FRONTAL LEFT HIP TO  INCLUDE ENTIRE PROSTHESIS COMPARISON:  March 04, 2019 FINDINGS: Frontal views obtained. There is a total hip replacement on the left with prosthetic components well-seated on frontal views. No fracture or dislocation. There is no appreciable right hip joint narrowing. IMPRESSION: Total hip replacement on left with prosthetic components well-seated. No fracture or dislocation. Electronically Signed   By: Lowella Grip III M.D.   On: 03/17/2020 11:05   DG Hip Port Unilat With Pelvis 1V Left  Result Date: 03/17/2020 CLINICAL DATA:  Status post total hip replacement PORTABLE PELVIS 1-2 VIEWS EXAM: PORTABLE PELVIS AND FRONTAL LEFT HIP TO INCLUDE ENTIRE PROSTHESIS COMPARISON:  March 04, 2019 FINDINGS: Frontal views obtained. There is a total hip replacement on the left with prosthetic components well-seated on frontal views. No fracture or dislocation. There is no appreciable right hip joint narrowing. IMPRESSION: Total hip replacement on left with prosthetic components well-seated. No fracture or dislocation. Electronically Signed   By: Lowella Grip III M.D.   On: 03/17/2020 11:05    Assessment/Plan: Principal  Problem:   Status post total hip replacement, left Active Problems:   Osteoarthritis of left hip   Left hip osteoarthritis: 1 Day Post-Op s/p Procedure(s):TOTAL HIP ARTHROPLASTY  Weightbearing: WBAT LLE Insicional and dressing care: Reinforce dressings as needed VTE prophylaxis: Aspirin 325mg  BID x 30 days Pain control: continue current regimen, will d/c with oxycodone 5 mg Follow - up plan: 2 weeks with Dr. Mardelle Matte. Patient is set up for HHPT.  Dispo: Plan to discharge home today after PT if able to progress mobility and pass stair training   Contact information:   Weekdays 8-5 Merlene Pulling, PA-C [phone] A fter hours and holidays please check [email or web address] for group call information for [facility]  Ventura Bruns 03/18/2020, 7:29 AM

## 2020-03-18 NOTE — Discharge Summary (Signed)
Discharge Summary  Patient ID: Regina Berry MRN: [id number] DOB/AGE: 02-16-1951 69 y.o.  Admit date: 03/17/2020 Discharge date: 03/18/2020  Admission Diagnoses:  Left hip osteoarthritis   Discharge Diagnoses:  Principal Problem:   Status post total hip replacement, left Active Problems:   Osteoarthritis of left hip   Past Medical History:  Diagnosis Date  . Anemia   . Anxiety   . Arthritis    Lt hip  . Cancer ([place]) 2020   skin  Rt leg   . Chronic bronchitis ([place])   . COPD (chronic obstructive pulmonary disease) ([place])   . Dyspnea   . GERD (gastroesophageal reflux disease)   . Hypertension   . Hypothyroidism   . Obesity (BMI 30-39.9) 12/21/2017  . OSA on CPAP 12/21/2017   Severe with AHI 29.4/h and oxygen desaturations as low as 75%.  She is now on CPAP at 6 cm water pressure with 2 L oxygen due to nocturnal hypoxemia  . Pelvic pressure in female 01/23/2014  . Positive fecal occult blood test 06/23/2015  . Rectocele 01/23/2014  . 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 Thyroid disease     Surgeries: Procedure(s): TOTAL HIP ARTHROPLASTY on 03/17/2020   Consultants (if any):   Discharged Condition: Improved  Hospital Course: Regina Berry is an 69 y.o. female who was admitted 03/17/2020 with a diagnosis of left hip osteoarthritis and went to the operating room on 03/17/2020 and underwent the above named procedures.    She was given perioperative antibiotics:  Anti-infectives (From admission, onward)   Start     Dose/Rate Route Frequency Ordered Stop   03/17/20 1600  ceFAZolin (ANCEF) IVPB 2g/100 mL premix        2 g 200 mL/hr over 30 Minutes Intravenous Every 6 hours 03/17/20 1447 03/17/20 2349   03/17/20 0600  ceFAZolin (ANCEF) IVPB 2g/100 mL premix        2 g 200 mL/hr over 30 Minutes Intravenous On call to O.R. 03/17/20 7628 03/17/20 0744    .  She was given sequential compression devices, early ambulation, and aspirin for DVT prophylaxis.  She benefited maximally  from the hospital stay and there were no complications.    Recent vital signs:  Vitals:   03/18/20 0158 03/18/20 0552  BP: (!) 113/53 (!) 115/52  Pulse: 66 68  Resp: 15 16  Temp: 98.2 F (36.8 C) 98.1 F (36.7 C)  SpO2: 97% 98%    Recent laboratory studies:  Lab Results  Component Value Date   HGB 10.3 (L) 03/18/2020   HGB 13.2 03/05/2020   HGB 14.3 01/02/2018   Lab Results  Component Value Date   WBC 11.4 (H) 03/18/2020   PLT 171 03/18/2020   No results found for: INR Lab Results  Component Value Date   NA 137 03/18/2020   K 3.7 03/18/2020   CL 98 03/18/2020   CO2 29 03/18/2020   BUN 17 03/18/2020   CREATININE 0.90 03/18/2020   GLUCOSE 158 (H) 03/18/2020    Discharge Medications:   Allergies as of 03/18/2020      Reactions   Codeine Itching      Medication List    STOP taking these medications   HYDROcodone-acetaminophen 10-325 MG tablet Commonly known as: NORCO   ibuprofen 200 MG tablet Commonly known as: ADVIL   predniSONE 10 MG (21) Tbpk tablet Commonly known as: STERAPRED UNI-PAK 21 TAB     TAKE these medications   aspirin EC 325 MG tablet Take 1  tablet (325 mg total) by mouth 2 (two) times daily. What changed:   medication strength  how much to take  when to take this   baclofen 10 MG tablet Commonly known as: LIORESAL Take 1 tablet (10 mg total) by mouth 3 (three) times daily. As needed for muscle spasm   buPROPion 300 MG 24 hr tablet Commonly known as: WELLBUTRIN XL Take 300 mg by mouth daily.   CAL-MAG-ZINC PO Take 1 tablet by mouth daily.   citalopram 40 MG tablet Commonly known as: CELEXA Take 40 mg by mouth at bedtime.   Fish Oil 1000 MG Caps Take 1,000 mg by mouth daily.   fluticasone 50 MCG/ACT nasal spray Commonly known as: FLONASE Place 2 sprays into both nostrils daily as needed (allergies.).   furosemide 20 MG tablet Commonly known as: LASIX Take 20 mg by mouth daily as needed (fluid retention.).    levocetirizine 5 MG tablet Commonly known as: XYZAL Take 5 mg by mouth at bedtime.   levothyroxine 88 MCG tablet Commonly known as: SYNTHROID Take 88 mcg by mouth daily before breakfast.   losartan-hydrochlorothiazide 100-25 MG tablet Commonly known as: HYZAAR Take 1 tablet by mouth daily.   ondansetron 4 MG tablet Commonly known as: Zofran Take 1 tablet (4 mg total) by mouth every 8 (eight) hours as needed for nausea or vomiting.   oxyCODONE 5 MG immediate release tablet Commonly known as: Roxicodone Take 1 tablet (5 mg total) by mouth every 4 (four) hours as needed for severe pain.   polyethylene glycol powder 17 GM/SCOOP powder Commonly known as: MiraLax Take 17 g by mouth daily. To prevent constipation What changed:   when to take this  reasons to take this   potassium chloride SA 20 MEQ tablet Commonly known as: KLOR-CON Take 20 mEq by mouth daily.   sennosides-docusate sodium 8.6-50 MG tablet Commonly known as: SENOKOT-S Take 2 tablets by mouth daily.   vitamin B-12 1000 MCG tablet Commonly known as: CYANOCOBALAMIN Take 1,000 mcg by mouth daily.   zolpidem 10 MG tablet Commonly known as: AMBIEN Take 10 mg by mouth at bedtime as needed for sleep.       Diagnostic Studies: DG Pelvis Portable  Result Date: 03/17/2020 CLINICAL DATA:  Status post total hip replacement PORTABLE PELVIS 1-2 VIEWS EXAM: PORTABLE PELVIS AND FRONTAL LEFT HIP TO INCLUDE ENTIRE PROSTHESIS COMPARISON:  March 04, 2019 FINDINGS: Frontal views obtained. There is a total hip replacement on the left with prosthetic components well-seated on frontal views. No fracture or dislocation. There is no appreciable right hip joint narrowing. IMPRESSION: Total hip replacement on left with prosthetic components well-seated. No fracture or dislocation. Electronically Signed   By: Lowella Grip III M.D.   On: 03/17/2020 11:05   DG Hip Port Unilat With Pelvis 1V Left  Result Date: 03/17/2020 CLINICAL  DATA:  Status post total hip replacement PORTABLE PELVIS 1-2 VIEWS EXAM: PORTABLE PELVIS AND FRONTAL LEFT HIP TO INCLUDE ENTIRE PROSTHESIS COMPARISON:  March 04, 2019 FINDINGS: Frontal views obtained. There is a total hip replacement on the left with prosthetic components well-seated on frontal views. No fracture or dislocation. There is no appreciable right hip joint narrowing. IMPRESSION: Total hip replacement on left with prosthetic components well-seated. No fracture or dislocation. Electronically Signed   By: Lowella Grip III M.D.   On: 03/17/2020 11:05    Disposition: Discharge disposition: 01-Home or Self Care          Follow-up Information  Marchia Bond, MD. Go on 03/30/2020.   Specialty: Orthopedic Surgery Why: Your appointment has been scheduled for 11:30.  Contact information: [place] [address] [phone]        [facility] up.   Specialty: [place] Why: HHPT will see you at home for 5 visits prior to starting outpatient physical therapy  Contact information: [address] [place] [address] [phone]        [facility]. Go on 03/31/2020.   Why: They will call you with time for starting  Contact information: [phone]               Signed: Ventura Bruns PA-C 03/18/2020, 7:50 AM

## 2020-03-18 NOTE — Progress Notes (Signed)
Physical Therapy Treatment Patient Details Name: Regina Berry MRN: [id number] DOB: 1950-10-12 Today's Date: 03/18/2020    History of Present Illness Patient is 69 y.o. female s/p Lt THA posterolateral approach on 03/17/20 with PMH significant for HTN, COPD, GERD, OA, anxiety, hypothyroidism.    PT Comments    Progressing well with mobility. Reviewed/practiced exercises, gait training, and stair training. Reviewed car transfer technique. Pt has hip precaution handout for reference. All education completed. Okay to d/c from PT standpoint.     Follow Up Recommendations  Follow surgeon's recommendation for DC plan and follow-up therapies     Equipment Recommendations  Rolling walker with 5" wheels;3in1 (PT)    Recommendations for Other Services       Precautions / Restrictions Precautions Precautions: Fall;Posterior Hip Precaution Comments: Pt able to recall 3/3 hip precautions. She still requires intermittent cues to put them into practice Restrictions Weight Bearing Restrictions: No Other Position/Activity Restrictions: WBAT    Mobility  Bed Mobility               General bed mobility comments: oob in recliner  Transfers Overall transfer level: Needs assistance Equipment used: Rolling walker (2 wheeled) Transfers: Sit to/from Stand Sit to Stand: Min guard         General transfer comment: VCs safety, adherence to precautions  Ambulation/Gait Ambulation/Gait assistance: Min guard Gait Distance (Feet): 75 Feet Assistive device: Rolling walker (2 wheeled) Gait Pattern/deviations: Step-to pattern;Step-through pattern;Decreased stride length     General Gait Details: Min guard for safety.   Stairs Stairs: Yes Stairs assistance: Min guard Stair Management: Step to pattern;Forwards;With walker Number of Stairs: 1 General stair comments: VC safety, technique, sequence. Min guard for safety.   Wheelchair Mobility    Modified Rankin (Stroke Patients  Only)       Balance Overall balance assessment: Needs assistance         Standing balance support: Bilateral upper extremity supported Standing balance-Leahy Scale: Fair                              Cognition Arousal/Alertness: Awake/alert Behavior During Therapy: WFL for tasks assessed/performed Overall Cognitive Status: Within Functional Limits for tasks assessed                                        Exercises Total Joint Exercises Ankle Circles/Pumps: AROM;Both;10 reps Quad Sets: AROM;Both;10 reps Heel Slides: AAROM;Left;10 reps Hip ABduction/ADduction: AAROM;Left;10 reps Long Arc Quad: AROM;Left;10 reps;Seated    General Comments        Pertinent Vitals/Pain Pain Assessment: 0-10 Pain Score: 5  Pain Location: L hip Pain Descriptors / Indicators: Discomfort;Sore;Aching Pain Intervention(s): Limited activity within patient's tolerance;Monitored during session;Repositioned;Ice applied    Home Living                      Prior Function            PT Goals (current goals can now be found in the care plan section) Progress towards PT goals: Progressing toward goals    Frequency    7X/week      PT Plan Current plan remains appropriate    Co-evaluation              AM-PAC PT "6 Clicks" Mobility   Outcome Measure  Help needed turning from your back  to your side while in a flat bed without using bedrails?: A Little Help needed moving from lying on your back to sitting on the side of a flat bed without using bedrails?: A Little Help needed moving to and from a bed to a chair (including a wheelchair)?: A Little Help needed standing up from a chair using your arms (e.g., wheelchair or bedside chair)?: A Little Help needed to walk in hospital room?: A Little Help needed climbing 3-5 steps with a railing? : A Little 6 Click Score: 18    End of Session Equipment Utilized During Treatment: Gait belt Activity  Tolerance: Patient tolerated treatment well Patient left: in chair;with call bell/phone within reach   PT Visit Diagnosis: Muscle weakness (generalized) (M62.81);Difficulty in walking, not elsewhere classified (R26.2)     Time: 3754-3606 PT Time Calculation (min) (ACUTE ONLY): 22 min  Charges:  $Gait Training: 8-22 mins                        Doreatha Massed, PT Acute Rehabilitation  Office: [phone] Pager: [phone]

## 2020-03-20 DIAGNOSIS — N816 Rectocele: Secondary | ICD-10-CM | POA: Diagnosis not present

## 2020-03-20 DIAGNOSIS — G4733 Obstructive sleep apnea (adult) (pediatric): Secondary | ICD-10-CM | POA: Diagnosis not present

## 2020-03-20 DIAGNOSIS — E039 Hypothyroidism, unspecified: Secondary | ICD-10-CM | POA: Diagnosis not present

## 2020-03-20 DIAGNOSIS — D649 Anemia, unspecified: Secondary | ICD-10-CM | POA: Diagnosis not present

## 2020-03-20 DIAGNOSIS — F329 Major depressive disorder, single episode, unspecified: Secondary | ICD-10-CM | POA: Diagnosis not present

## 2020-03-20 DIAGNOSIS — Z6834 Body mass index (BMI) 34.0-34.9, adult: Secondary | ICD-10-CM | POA: Diagnosis not present

## 2020-03-20 DIAGNOSIS — Z471 Aftercare following joint replacement surgery: Secondary | ICD-10-CM | POA: Diagnosis not present

## 2020-03-20 DIAGNOSIS — F17210 Nicotine dependence, cigarettes, uncomplicated: Secondary | ICD-10-CM | POA: Diagnosis not present

## 2020-03-20 DIAGNOSIS — E669 Obesity, unspecified: Secondary | ICD-10-CM | POA: Diagnosis not present

## 2020-03-20 DIAGNOSIS — Z9181 History of falling: Secondary | ICD-10-CM | POA: Diagnosis not present

## 2020-03-20 DIAGNOSIS — K219 Gastro-esophageal reflux disease without esophagitis: Secondary | ICD-10-CM | POA: Diagnosis not present

## 2020-03-20 DIAGNOSIS — Z85828 Personal history of other malignant neoplasm of skin: Secondary | ICD-10-CM | POA: Diagnosis not present

## 2020-03-20 DIAGNOSIS — Z96642 Presence of left artificial hip joint: Secondary | ICD-10-CM | POA: Diagnosis not present

## 2020-03-20 DIAGNOSIS — F419 Anxiety disorder, unspecified: Secondary | ICD-10-CM | POA: Diagnosis not present

## 2020-03-20 DIAGNOSIS — J449 Chronic obstructive pulmonary disease, unspecified: Secondary | ICD-10-CM | POA: Diagnosis not present

## 2020-03-20 DIAGNOSIS — I10 Essential (primary) hypertension: Secondary | ICD-10-CM | POA: Diagnosis not present

## 2020-03-24 DIAGNOSIS — E039 Hypothyroidism, unspecified: Secondary | ICD-10-CM | POA: Diagnosis not present

## 2020-03-24 DIAGNOSIS — Z471 Aftercare following joint replacement surgery: Secondary | ICD-10-CM | POA: Diagnosis not present

## 2020-03-24 DIAGNOSIS — F419 Anxiety disorder, unspecified: Secondary | ICD-10-CM | POA: Diagnosis not present

## 2020-03-24 DIAGNOSIS — K219 Gastro-esophageal reflux disease without esophagitis: Secondary | ICD-10-CM | POA: Diagnosis not present

## 2020-03-24 DIAGNOSIS — F329 Major depressive disorder, single episode, unspecified: Secondary | ICD-10-CM | POA: Diagnosis not present

## 2020-03-24 DIAGNOSIS — I10 Essential (primary) hypertension: Secondary | ICD-10-CM | POA: Diagnosis not present

## 2020-03-25 DIAGNOSIS — F329 Major depressive disorder, single episode, unspecified: Secondary | ICD-10-CM | POA: Diagnosis not present

## 2020-03-25 DIAGNOSIS — I10 Essential (primary) hypertension: Secondary | ICD-10-CM | POA: Diagnosis not present

## 2020-03-25 DIAGNOSIS — K219 Gastro-esophageal reflux disease without esophagitis: Secondary | ICD-10-CM | POA: Diagnosis not present

## 2020-03-25 DIAGNOSIS — F419 Anxiety disorder, unspecified: Secondary | ICD-10-CM | POA: Diagnosis not present

## 2020-03-25 DIAGNOSIS — E039 Hypothyroidism, unspecified: Secondary | ICD-10-CM | POA: Diagnosis not present

## 2020-03-25 DIAGNOSIS — Z471 Aftercare following joint replacement surgery: Secondary | ICD-10-CM | POA: Diagnosis not present

## 2020-03-26 DIAGNOSIS — Z471 Aftercare following joint replacement surgery: Secondary | ICD-10-CM | POA: Diagnosis not present

## 2020-03-26 DIAGNOSIS — E039 Hypothyroidism, unspecified: Secondary | ICD-10-CM | POA: Diagnosis not present

## 2020-03-26 DIAGNOSIS — F419 Anxiety disorder, unspecified: Secondary | ICD-10-CM | POA: Diagnosis not present

## 2020-03-26 DIAGNOSIS — F329 Major depressive disorder, single episode, unspecified: Secondary | ICD-10-CM | POA: Diagnosis not present

## 2020-03-26 DIAGNOSIS — I10 Essential (primary) hypertension: Secondary | ICD-10-CM | POA: Diagnosis not present

## 2020-03-26 DIAGNOSIS — K219 Gastro-esophageal reflux disease without esophagitis: Secondary | ICD-10-CM | POA: Diagnosis not present

## 2020-03-30 DIAGNOSIS — F329 Major depressive disorder, single episode, unspecified: Secondary | ICD-10-CM | POA: Diagnosis not present

## 2020-03-30 DIAGNOSIS — E039 Hypothyroidism, unspecified: Secondary | ICD-10-CM | POA: Diagnosis not present

## 2020-03-30 DIAGNOSIS — M1612 Unilateral primary osteoarthritis, left hip: Secondary | ICD-10-CM | POA: Diagnosis not present

## 2020-03-30 DIAGNOSIS — F419 Anxiety disorder, unspecified: Secondary | ICD-10-CM | POA: Diagnosis not present

## 2020-03-30 DIAGNOSIS — Z471 Aftercare following joint replacement surgery: Secondary | ICD-10-CM | POA: Diagnosis not present

## 2020-03-30 DIAGNOSIS — I10 Essential (primary) hypertension: Secondary | ICD-10-CM | POA: Diagnosis not present

## 2020-03-30 DIAGNOSIS — K219 Gastro-esophageal reflux disease without esophagitis: Secondary | ICD-10-CM | POA: Diagnosis not present

## 2020-03-31 ENCOUNTER — Other Ambulatory Visit: Payer: Self-pay

## 2020-03-31 ENCOUNTER — Encounter (HOSPITAL_COMMUNITY): Payer: Self-pay | Admitting: Physical Therapy

## 2020-03-31 ENCOUNTER — Ambulatory Visit (HOSPITAL_COMMUNITY): Payer: Medicare Other | Attending: Orthopedic Surgery | Admitting: Physical Therapy

## 2020-03-31 DIAGNOSIS — M25552 Pain in left hip: Secondary | ICD-10-CM

## 2020-03-31 DIAGNOSIS — R29898 Other symptoms and signs involving the musculoskeletal system: Secondary | ICD-10-CM | POA: Insufficient documentation

## 2020-03-31 DIAGNOSIS — R2689 Other abnormalities of gait and mobility: Secondary | ICD-10-CM | POA: Diagnosis not present

## 2020-03-31 DIAGNOSIS — M6281 Muscle weakness (generalized): Secondary | ICD-10-CM

## 2020-03-31 NOTE — Therapy (Signed)
[place], [place], [address] Phone: [phone]   Fax:  [phone]  Physical Therapy Evaluation  Patient Details  Name: Regina Berry MRN: [id number] Date of Birth: 16-Jan-1951 Referring Provider (PT): Marchia Bond MD   Encounter Date: 03/31/2020   PT End of Session - 03/31/20 1211    Visit Number 1    Number of Visits 18    Date for PT Re-Evaluation 05/12/20    Authorization Type Medicare    Progress Note Due on Visit 10    PT Start Time 1030    PT Stop Time 1115    PT Time Calculation (min) 45 min    Activity Tolerance Patient tolerated treatment well;Patient limited by fatigue    Behavior During Therapy [facility] for tasks assessed/performed           Past Medical History:  Diagnosis Date  . Anemia   . Anxiety   . Arthritis    Lt hip  . Cancer ([place]) 2020   skin  Rt leg   . Chronic bronchitis ([place])   . COPD (chronic obstructive pulmonary disease) ([place])   . Dyspnea   . GERD (gastroesophageal reflux disease)   . Hypertension   . Hypothyroidism   . Obesity (BMI 30-39.9) 12/21/2017  . OSA on CPAP 12/21/2017   Severe with AHI 29.4/h and oxygen desaturations as low as 75%.  She is now on CPAP at 6 cm water pressure with 2 L oxygen due to nocturnal hypoxemia  . Pelvic pressure in female 01/23/2014  . Positive fecal occult blood test 06/23/2015  . Rectocele 01/23/2014  . Scleroderma, limited ([place])   . Thyroid disease     Past Surgical History:  Procedure Laterality Date  . CESAREAN SECTION    . COLONOSCOPY N/A 09/11/2015   Procedure: COLONOSCOPY;  Surgeon: Rogene Houston, MD;  [facility];  Service: Endoscopy;  Laterality: N/A;  moved to 1/6 @ 1:35 - Ann to notify pt  . EYE SURGERY Bilateral 2021  . LUNG SURGERY Left    infection on outside of lung that had to be"scaped off".  Tildon Husky REPAIR N/A 01/02/2018   Procedure: POSTERIOR REPAIR (RECTOCELE);  Surgeon: Jonnie Kind, MD;   Location: [facility];  Service: Gynecology;  Laterality: N/A;  . TOTAL HIP ARTHROPLASTY Left 03/17/2020   Procedure: TOTAL HIP ARTHROPLASTY;  Surgeon: Marchia Bond, MD;  Location: [facility];  Service: Orthopedics;  Laterality: Left;    There were no vitals filed for this visit.    Subjective Assessment - 03/31/20 1031    Subjective Patient is a 69 y.o. female who presents to physical therapy s/p L THA on 03/17/20. Patient states the first 3 days were rough but she is only having soreness and not the same pain as before. She states about 5 years of worsening pain which led her to THA. She had home health PT and has been doing ankle pumps, quad sets, glute squeezes, heel slides, abduction in supine, hamstring sets, SAQ, hip abduction in standing, and hamstring curls. Patient is having trouble with dressing herself, transfers. She has been following her precautions. She can't wait to get back to showering. She also has difficulty getting into bed and has been sleeping in the recliners. Patient states her main goal is to get back to walking normal.    Limitations Standing;Walking;House hold activities    How long can you stand comfortably? 2 minutes    How long  can you walk comfortably? 5 minutes    Patient Stated Goals improve walking    Currently in Pain? Yes    Pain Score 3    worst 5/10   Pain Location Hip    Pain Orientation Left              [facility] PT Assessment - 03/31/20 0001      Assessment   Medical Diagnosis L THA    Referring Provider (PT) Marchia Bond MD    Onset Date/Surgical Date 03/17/20    Next MD Visit August 25    Prior Therapy HHPT      Precautions   Precautions Posterior Hip   posterolateral approach   Precaution Comments Patient has handout of precautions      Restrictions   Weight Bearing Restrictions Yes    Other Position/Activity Restrictions LLE WBAT      Balance Screen   Has the patient fallen in the past 6 months Yes    How many times? 2    Has the patient  had a decrease in activity level because of a fear of falling?  No    Is the patient reluctant to leave their home because of a fear of falling?  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[employer]      Cognition   Overall Cognitive Status Within Functional Limits for tasks assessed      Observation/Other Assessments   Observations Ambulates with RW    Focus on Therapeutic Outcomes (FOTO)  45% limited      ROM / Strength   AROM / PROM / Strength AROM;Strength      AROM   Overall AROM Comments limited by pain on LLE    AROM Assessment Site Hip    Right/Left Hip Right;Left    Right Hip Flexion 95    Right Hip ABduction 28   in supine   Left Hip Flexion 60    Left Hip ABduction 18   in supine     Strength   Strength Assessment Site Hip;Knee;Ankle    Right/Left Hip Right;Left    Right Hip Flexion 4-/5    Left Hip Flexion 4-/5   slight pain   Right/Left Knee Right;Left    Right Knee Flexion 5/5    Right Knee Extension 5/5    Left Knee Flexion 5/5    Left Knee Extension 5/5    Right/Left Ankle Right;Left    Right Ankle Dorsiflexion 5/5    Left Ankle Dorsiflexion 5/5      Transfers   Comments requires UE use, relies on RLE      Ambulation/Gait   Ambulation/Gait Yes    Ambulation/Gait Assistance 6: Modified independent (Device/Increase time)    Ambulation Distance (Feet) 250 Feet    Assistive device Rolling walker    Gait Pattern Step-through pattern    Ambulation Surface Level;Indoor    Gait velocity decreased    Gait Comments 2 MWT, slow, labored, limited by fatigue and impaired activity tolerance                      Objective measurements completed on examination: See above findings.               PT Education - 03/31/20 1042    Education Details Patient educated on exam findings, POC, scope of PT, signs of DVT, continuing HEP,  continuing hip precautions    Person(s)  Educated Patient    Methods Explanation;Demonstration    Comprehension Verbalized understanding;Returned demonstration            PT Short Term Goals - 03/31/20 1218      PT SHORT TERM GOAL #1   Title Patient will be independent with HEP in order to improve functional outcomes.    Time 3    Period Weeks    Status New    Target Date 04/21/20      PT SHORT TERM GOAL #2   Title Patient will report at least 25% improvement in symptoms for improved quality of life.    Time 3    Period Weeks    Status New    Target Date 04/21/20             PT Long Term Goals - 03/31/20 1218      PT LONG TERM GOAL #1   Title Patient will report at least 75% improvement in symptoms for improved quality of life.    Time 6    Period Weeks    Status New    Target Date 05/12/20      PT LONG TERM GOAL #2   Title Patient will improve FOTO score by at least 10 points in order to indicate improved tolerance to activity.    Time 6    Period Weeks    Status New    Target Date 05/12/20      PT LONG TERM GOAL #3   Title Patient will be able to navigate stairs with reciprocal pattern without compensation in order to demonstrate improved LE strength.    Time 6    Period Weeks    Status New    Target Date 05/12/20                  Plan - 03/31/20 1212    Clinical Impression Statement Patient is a 69 y.o. female who presents to physical therapy s/p L THA on 03/17/20. She presents with pain limited deficits in L hip strength, ROM, endurance, activity tolerance, balance, gait, and functional mobility with ADL. She is having to modify and restrict ADL as indicated by FOTO score as well as subjective information and objective measures which is affecting overall participation. Patient will benefit from skilled physical therapy in order to improve function and reduce impairment.    Personal Factors and Comorbidities Comorbidity 2;Age;Behavior Pattern;Time since onset of  injury/illness/exacerbation;Fitness    Comorbidities COPD, HTN    Examination-Activity Limitations Bathing;Bed Mobility;Bend;Caring for Others;Carry;Dressing;Hygiene/Grooming;Lift;Locomotion Level;Sleep;Squat;Stairs;Stand;Transfers    Examination-Participation Restrictions Cleaning;Yard Work;Shop;Volunteer    Stability/Clinical Decision Making Stable/Uncomplicated    Clinical Decision Making Low    Rehab Potential Good    PT Frequency 3x / week    PT Duration 6 weeks    PT Treatment/Interventions ADLs/Self Care Home Management;Aquatic Therapy;Biofeedback;Cryotherapy;Electrical Stimulation;Moist Heat;Iontophoresis 4mg /ml Dexamethasone;Traction;Ultrasound;Parrafin;Fluidtherapy;Contrast Bath;DME Instruction;Gait training;Stair training;Functional mobility training;Therapeutic activities;Therapeutic exercise;Balance training;Neuromuscular re-education;Patient/family education;Orthotic Fit/Training;Manual techniques;Manual lymph drainage;Compression bandaging;Scar mobilization;Passive range of motion;Dry needling;Energy conservation;Vasopneumatic Device;Splinting;Taping;Spinal Manipulations;Joint Manipulations    PT Next Visit Plan begin LE strengthing and ROM within precautions for posterolateral hip replacement, possibly progress from HHPT exercises if able and eliminate others as able, gait training with SPC as able    PT Home Exercise Plan 03/31/20 HHPT exercises (ankle pumps, quad sets, glute squeezes, heel slides, abduction in supine, hamstring sets, SAQ, hip abduction in standing, and hamstring curls)    Consulted and Agree with Plan of Care  Patient           Patient will benefit from skilled therapeutic intervention in order to improve the following deficits and impairments:     Visit Diagnosis: Pain in left hip  Muscle weakness (generalized)  Other abnormalities of gait and mobility  Other symptoms and signs involving the musculoskeletal system     Problem List Patient Active  Problem List   Diagnosis Date Noted  . 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 Osteoarthritis of left hip 03/17/2020  . Enterocele 01/02/2018  . OSA on CPAP 12/21/2017  . Obesity (BMI 30-39.9) 12/21/2017  . Depression 08/18/2015  . Positive fecal occult blood test 06/23/2015  . Pelvic pressure in female 01/23/2014  . Rectocele 01/23/2014   12:21 PM, 03/31/20 Mearl Latin PT, DPT Physical Therapist at [place], [place], [address] Phone: [phone]   Fax:  [phone]  Name: Regina Berry MRN: [id number] Date of Birth: 02/23/1951

## 2020-04-02 ENCOUNTER — Other Ambulatory Visit: Payer: Self-pay

## 2020-04-02 ENCOUNTER — Ambulatory Visit (HOSPITAL_COMMUNITY): Payer: Medicare Other | Admitting: Physical Therapy

## 2020-04-02 ENCOUNTER — Encounter (HOSPITAL_COMMUNITY): Payer: Self-pay | Admitting: Physical Therapy

## 2020-04-02 DIAGNOSIS — R29898 Other symptoms and signs involving the musculoskeletal system: Secondary | ICD-10-CM

## 2020-04-02 DIAGNOSIS — M6281 Muscle weakness (generalized): Secondary | ICD-10-CM

## 2020-04-02 DIAGNOSIS — M25552 Pain in left hip: Secondary | ICD-10-CM

## 2020-04-02 DIAGNOSIS — R2689 Other abnormalities of gait and mobility: Secondary | ICD-10-CM | POA: Diagnosis not present

## 2020-04-02 NOTE — Therapy (Signed)
[place], [place], [address] Phone: [phone]   Fax:  [phone]  Physical Therapy Treatment  Patient Details  Name: Regina Berry MRN: [id number] Date of Birth: [birth year] Referring Provider (PT): Marchia Bond MD   Encounter Date: 04/02/2020   PT End of Session - 04/02/20 1045    Visit Number 2    Number of Visits 18    Date for PT Re-Evaluation 05/12/20    Authorization Type Medicare    Progress Note Due on Visit 10    PT Start Time 1034    PT Stop Time 1112    PT Time Calculation (min) 38 min    Activity Tolerance Patient tolerated treatment well;Patient limited by fatigue    Behavior During Therapy [facility] for tasks assessed/performed           Past Medical History:  Diagnosis Date   Anemia    Anxiety    Arthritis    Lt hip   Cancer ([place]) 2020   skin  Rt leg    Chronic bronchitis ([facility])    COPD (chronic obstructive pulmonary disease) ([place])    Dyspnea    GERD (gastroesophageal reflux disease)    Hypertension    Hypothyroidism    Obesity (BMI 30-39.9) 12/21/2017   OSA on CPAP 12/21/2017   Severe with AHI 29.4/h and oxygen desaturations as low as 75%.  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   RECTOCELE REPAIR N/A 01/02/2018   Procedure: POSTERIOR REPAIR (RECTOCELE);  Surgeon: Jonnie Kind, MD;   Location: [facility];  Service: Gynecology;  Laterality: N/A;   TOTAL HIP ARTHROPLASTY Left 03/17/2020   Procedure: TOTAL HIP ARTHROPLASTY;  Surgeon: Marchia Bond, MD;  Location[facility];  Service: Orthopedics;  Laterality: Left;    There were no vitals filed for this visit.   Subjective Assessment - 04/02/20 1038    Subjective Patient reported that she has some soreness, but no pain.    Limitations Standing;Walking;House hold activities    How long can you stand comfortably? 2 minutes    How long can you walk comfortably? 5 minutes    Patient Stated Goals improve walking    Currently in Pain? No/denies                             [facility] Adult PT Treatment/Exercise - 04/02/20 0001      Ambulation/Gait   Ambulation/Gait Yes    Ambulation Distance (Feet) 226 Feet   x2   Assistive device Rolling walker;Straight cane    Gait Comments First 226 feet with RW, second trip with cane. Cueing for proper form.       Exercises   Exercises Knee/Hip      Knee/Hip  Exercises: Standing   Heel Raises Both;3 sets;10 reps    Heel Raises Limitations Toe raises also    Knee Flexion Strengthening;Right;Left;3 sets;10 reps    Knee Flexion Limitations Bil HHA    Hip Abduction Stengthening;Right;Left;3 sets;10 reps;Knee straight    Abduction Limitations Bil UE assist    Forward Step Up 2 sets;10 reps;Step Height: 4";Hand Hold: 2;Right;Left    Functional Squat 2 sets;15 reps    Functional Squat Limitations Bil HHA    Other Standing Knee Exercises Marching hip flexed <90 degrees. 30x alternating legs      Knee/Hip Exercises: Supine   Short Arc Quad Sets Strengthening;Left;3 sets;10 reps    Short Arc Quad Sets Limitations Foam roller under knee    Other Supine Knee/Hip Exercises Glut sets 30x hooklying                    PT Short Term Goals - 04/02/20 1118      PT SHORT TERM GOAL #1   Title Patient will be independent with HEP in order to improve functional outcomes.     Time 3    Period Weeks    Status On-going    Target Date 04/21/20      PT SHORT TERM GOAL #2   Title Patient will report at least 25% improvement in symptoms for improved quality of life.    Time 3    Period Weeks    Status On-going    Target Date 04/21/20             PT Long Term Goals - 04/02/20 1119      PT LONG TERM GOAL #1   Title Patient will report at least 75% improvement in symptoms for improved quality of life.    Time 6    Period Weeks    Status On-going      PT LONG TERM GOAL #2   Title Patient will improve FOTO score by at least 10 points in order to indicate improved tolerance to activity.    Time 6    Period Weeks    Status On-going      PT LONG TERM GOAL #3   Title Patient will be able to navigate stairs with reciprocal pattern without compensation in order to demonstrate improved LE strength.    Time 6    Period Weeks    Status On-going                 Plan - 04/02/20 1118    Clinical Impression Statement Focused on lower extremity strengthening, mobility, and stability this session. Added marching with cues to maintain hip flexion less than 90 degrees to maintain posterior THA precautions. Also added step-ups this session onto a 4 step with HHA. Patient tolerated this well and reported feeling her hip flexor working. This session also educated the patient on gait training using a straight cane. Patient reported no pain with this, but a little unsteadiness. Plan to continue gait training with a cane in upcoming sessions.    Personal Factors and Comorbidities Comorbidity 2;Age;Behavior Pattern;Time since onset of injury/illness/exacerbation;Fitness    Comorbidities COPD, HTN    Examination-Activity Limitations Bathing;Bed Mobility;Bend;Caring for Others;Carry;Dressing;Hygiene/Grooming;Lift;Locomotion Level;Sleep;Squat;Stairs;Stand;Transfers    Examination-Participation Restrictions Cleaning;Yard Work;Shop;Volunteer    Stability/Clinical Decision  Making Stable/Uncomplicated    Rehab Potential Good    PT Frequency 3x / week    PT Duration 6 weeks    PT Treatment/Interventions ADLs/Self Care Home Management;Aquatic Therapy;Biofeedback;Cryotherapy;Electrical Stimulation;Moist Heat;Iontophoresis 4mg /ml Dexamethasone;Traction;Ultrasound;Parrafin;Fluidtherapy;Contrast Bath;DME  Instruction;Gait training;Stair training;Functional mobility training;Therapeutic activities;Therapeutic exercise;Balance training;Neuromuscular re-education;Patient/family education;Orthotic Fit/Training;Manual techniques;Manual lymph drainage;Compression bandaging;Scar mobilization;Passive range of motion;Dry needling;Energy conservation;Vasopneumatic Device;Splinting;Taping;Spinal Manipulations;Joint Manipulations    PT Next Visit Plan continue LE strengthing and ROM within precautions for posterolateral hip replacement, possibly progress from HHPT exercises if able and eliminate others as able, continue gait training with [facility]    PT Home Exercise Plan 03/31/20 HHPT exercises (ankle pumps, quad sets, glute squeezes, heel slides, abduction in supine, hamstring sets, SAQ, hip abduction in standing, and hamstring curls)    Consulted and Agree with Plan of Care Patient           Patient will benefit from skilled therapeutic intervention in order to improve the following deficits and impairments:     Visit Diagnosis: Pain in left hip  Muscle weakness (generalized)  Other abnormalities of gait and mobility  Other symptoms and signs involving the musculoskeletal system     Problem List Patient Active Problem List   Diagnosis Date Noted   Status post total hip replacement, 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   Osteoarthritis of left hip 03/17/2020   Enterocele 01/02/2018   OSA on CPAP 12/21/2017   Obesity (BMI 30-39.9) 12/21/2017   Depression 08/18/2015   Positive fecal occult blood test 06/23/2015   Pelvic pressure in female 01/23/2014   Rectocele 01/23/2014   Clarene Critchley PT, DPT 11:20 AM, 04/02/20 [place] [address] [place], [place], [address] Phone: [phone]   Fax:  [phone]  Name: Regina Berry MRN: [id number] Date of Birth: 1951/03/18

## 2020-04-06 ENCOUNTER — Encounter (HOSPITAL_COMMUNITY): Payer: Self-pay | Admitting: Physical Therapy

## 2020-04-06 ENCOUNTER — Other Ambulatory Visit: Payer: Self-pay

## 2020-04-06 ENCOUNTER — Ambulatory Visit (HOSPITAL_COMMUNITY): Payer: Medicare Other | Attending: Orthopedic Surgery | Admitting: Physical Therapy

## 2020-04-06 DIAGNOSIS — M6281 Muscle weakness (generalized): Secondary | ICD-10-CM

## 2020-04-06 DIAGNOSIS — M25552 Pain in left hip: Secondary | ICD-10-CM | POA: Diagnosis not present

## 2020-04-06 DIAGNOSIS — R2689 Other abnormalities of gait and mobility: Secondary | ICD-10-CM | POA: Diagnosis not present

## 2020-04-06 DIAGNOSIS — R29898 Other symptoms and signs involving the musculoskeletal system: Secondary | ICD-10-CM | POA: Diagnosis not present

## 2020-04-06 NOTE — Therapy (Signed)
[place], [place], [address] Phone: [phone]   Fax:  [phone]  Physical Therapy Treatment  Patient Details  Name: Regina Berry MRN: [id number] Date of Birth: 04-07-1951 Referring Provider (PT): Marchia Bond MD   Encounter Date: 04/06/2020   PT End of Session - 04/06/20 1033    Visit Number 3    Number of Visits 18    Date for PT Re-Evaluation 05/12/20    Authorization Type Medicare    Progress Note Due on Visit 10    PT Start Time 1034    PT Stop Time 1113    PT Time Calculation (min) 39 min    Activity Tolerance Patient tolerated treatment well;Patient limited by fatigue    Behavior During Therapy [facility] for tasks assessed/performed           Past Medical History:  Diagnosis Date  . Anemia   . Anxiety   . Arthritis    Lt hip  . Cancer ([place]) 2020   skin  Rt leg   . Chronic bronchitis ([place])   . COPD (chronic obstructive pulmonary disease) ([place])   . Dyspnea   . GERD (gastroesophageal reflux disease)   . Hypertension   . Hypothyroidism   . Obesity (BMI 30-39.9) 12/21/2017  . OSA on CPAP 12/21/2017   Severe with AHI 29.4/h and oxygen desaturations as low as 75%.  She is now on CPAP at 6 cm water pressure with 2 L oxygen due to nocturnal hypoxemia  . Pelvic pressure in female 01/23/2014  . Positive fecal occult blood test 06/23/2015  . Rectocele 01/23/2014  . 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 Thyroid disease     Past Surgical History:  Procedure Laterality Date  . CESAREAN SECTION    . COLONOSCOPY N/A 09/11/2015   Procedure: COLONOSCOPY;  Surgeon: Rogene Houston, MD;  [facility];  Service: Endoscopy;  Laterality: N/A;  moved to 1/6 @ 1:35 - Ann to notify pt  . EYE SURGERY Bilateral 2021  . LUNG SURGERY Left    infection on outside of lung that had to be"scaped off".  Tildon Husky REPAIR N/A 01/02/2018   Procedure: POSTERIOR REPAIR (RECTOCELE);  Surgeon: Jonnie Kind, MD;   Location: [facility] OR[facility];  Service: Gynecology;  Laterality: N/A;  . TOTAL HIP ARTHROPLASTY Left 03/17/2020   Procedure: TOTAL HIP ARTHROPLASTY;  Surgeon: Marchia Bond, MD;  Location: [facility];  Service: Orthopedics;  Laterality: Left;    There were no vitals filed for this visit.   Subjective Assessment - 04/06/20 1032    Subjective Patient states she has been trying to use the cane now. Her home exercises are going alright.    Limitations Standing;Walking;House hold activities    How long can you stand comfortably? 2 minutes    How long can you walk comfortably? 5 minutes    Patient Stated Goals improve walking    Currently in Pain? No/denies                             [facility] Adult PT Treatment/Exercise - 04/06/20 0001      Ambulation/Gait   Ambulation/Gait Yes    Ambulation/Gait Assistance 6: Modified independent (Device/Increase time)    Ambulation Distance (Feet) 226 Feet    Assistive device Straight cane    Gait Pattern Step-through pattern;Trendelenburg    Ambulation Surface Level;Indoor    Gait Comments ambulates with cane  with cueing for heel toe gait, foot clearance and proper use of cane      Knee/Hip Exercises: Standing   Heel Raises Both;3 sets;10 reps    Heel Raises Limitations Toe raises also    Knee Flexion Strengthening;Right;Left;15 reps    Knee Flexion Limitations Bil HHA    Hip Abduction Stengthening;Right;Left;3 sets;10 reps;Knee straight    Abduction Limitations unilateral UE support    Hip Extension 2 sets;10 reps;Both    Extension Limitations cueing for contralateral hip activation for stability with unilateral UE support    Lateral Step Up 10 reps;Left;Hand Hold: 2;Step Height: 4";3 sets    Forward Step Up 2 sets;10 reps;Step Height: 4";Right;Left;Hand Hold: 1    Forward Step Up Limitations last set - eccentric control    Functional Squat 2 sets;15 reps    Functional Squat Limitations Bil HHA    Other Standing Knee Exercises Marching hip  flexed <90 degrees. 30x alternating legs    Other Standing Knee Exercises lateral stepping 6x10 feet                  PT Education - 04/06/20 1032    Education Details Patient educated on HEP, mechanics of exercise    Person(s) Educated Patient    Methods Explanation;Demonstration    Comprehension Verbalized understanding;Returned demonstration            PT Short Term Goals - 04/02/20 1118      PT SHORT TERM GOAL #1   Title Patient will be independent with HEP in order to improve functional outcomes.    Time 3    Period Weeks    Status On-going    Target Date 04/21/20      PT SHORT TERM GOAL #2   Title Patient will report at least 25% improvement in symptoms for improved quality of life.    Time 3    Period Weeks    Status On-going    Target Date 04/21/20             PT Long Term Goals - 04/02/20 1119      PT LONG TERM GOAL #1   Title Patient will report at least 75% improvement in symptoms for improved quality of life.    Time 6    Period Weeks    Status On-going      PT LONG TERM GOAL #2   Title Patient will improve FOTO score by at least 10 points in order to indicate improved tolerance to activity.    Time 6    Period Weeks    Status On-going      PT LONG TERM GOAL #3   Title Patient will be able to navigate stairs with reciprocal pattern without compensation in order to demonstrate improved LE strength.    Time 6    Period Weeks    Status On-going                 Plan - 04/06/20 1033    Clinical Impression Statement Patient ambulates with flat foot at initial contact which improves with cueing for heel to toe gait pattern. She demonstrates Trendelenburg gait secondary to L hip abductor weakness and some truncal lean which improves after adjusting cane height up. She requires some verbal for slow and controlled movements secondary to impaired muscular endurance. Patient given cueing for hip abductor activation in weight bearing for  improved endurance and strength. She requires unilateral UE support for balance with marching. She has difficulty with  squatting mechanics initially mostly bending through knees and anterior weight shift. Patient given extensive cueing and demonstration for posterior weight shift with fair carry over. Patient able to complete lateral step up today with fatigue noted in L hip abductor. Patient has difficulty with lateral step down with eccentric emphasis due to impaired hip and quad strength. Patient will continue to benefit from skilled physical therapy in order to reduce impairment and improve function.    Personal Factors and Comorbidities Comorbidity 2;Age;Behavior Pattern;Time since onset of injury/illness/exacerbation;Fitness    Comorbidities COPD, HTN    Examination-Activity Limitations Bathing;Bed Mobility;Bend;Caring for Others;Carry;Dressing;Hygiene/Grooming;Lift;Locomotion Level;Sleep;Squat;Stairs;Stand;Transfers    Examination-Participation Restrictions Cleaning;Yard Work;Shop;Volunteer    Stability/Clinical Decision Making Stable/Uncomplicated    Rehab Potential Good    PT Frequency 3x / week    PT Duration 6 weeks    PT Treatment/Interventions ADLs/Self Care Home Management;Aquatic Therapy;Biofeedback;Cryotherapy;Electrical Stimulation;Moist Heat;Iontophoresis 4mg /ml Dexamethasone;Traction;Ultrasound;Parrafin;Fluidtherapy;Contrast Bath;DME Instruction;Gait training;Stair training;Functional mobility training;Therapeutic activities;Therapeutic exercise;Balance training;Neuromuscular re-education;Patient/family education;Orthotic Fit/Training;Manual techniques;Manual lymph drainage;Compression bandaging;Scar mobilization;Passive range of motion;Dry needling;Energy conservation;Vasopneumatic Device;Splinting;Taping;Spinal Manipulations;Joint Manipulations    PT Next Visit Plan continue LE strengthing and ROM within precautions for posterolateral hip replacement, possibly progress from HHPT  exercises if able and eliminate others as able, continue gait training with [facility]    PT Home Exercise Plan 03/31/20 HHPT exercises (ankle pumps, quad sets, glute squeezes, heel slides, abduction in supine, hamstring sets, SAQ, hip abduction in standing, and hamstring curls)    Consulted and Agree with Plan of Care Patient           Patient will benefit from skilled therapeutic intervention in order to improve the following deficits and impairments:     Visit Diagnosis: Pain in left hip  Muscle weakness (generalized)  Other abnormalities of gait and mobility  Other symptoms and signs involving the musculoskeletal system     Problem List Patient Active Problem List   Diagnosis Date Noted  . 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 Osteoarthritis of left hip 03/17/2020  . Enterocele 01/02/2018  . OSA on CPAP 12/21/2017  . Obesity (BMI 30-39.9) 12/21/2017  . Depression 08/18/2015  . Positive fecal occult blood test 06/23/2015  . Pelvic pressure in female 01/23/2014  . Rectocele 01/23/2014    11:14 AM, 04/06/20 Mearl Latin PT, DPT Physical Therapist at [place], [place], [address] Phone: [phone]   Fax:  [phone]  Name: Regina Berry MRN: [id number] Date of Birth: 29-Jul-1951

## 2020-04-08 ENCOUNTER — Other Ambulatory Visit: Payer: Self-pay

## 2020-04-08 ENCOUNTER — Encounter (HOSPITAL_COMMUNITY): Payer: Self-pay

## 2020-04-08 ENCOUNTER — Ambulatory Visit (HOSPITAL_COMMUNITY): Payer: Medicare Other

## 2020-04-08 DIAGNOSIS — R29898 Other symptoms and signs involving the musculoskeletal system: Secondary | ICD-10-CM | POA: Diagnosis not present

## 2020-04-08 DIAGNOSIS — M6281 Muscle weakness (generalized): Secondary | ICD-10-CM | POA: Diagnosis not present

## 2020-04-08 DIAGNOSIS — R2689 Other abnormalities of gait and mobility: Secondary | ICD-10-CM

## 2020-04-08 DIAGNOSIS — M25552 Pain in left hip: Secondary | ICD-10-CM

## 2020-04-08 NOTE — Therapy (Signed)
[place], [place], [address] Phone: [phone]   Fax:  [phone]  Physical Therapy Treatment  Patient Details  Name: Regina Berry MRN: [id number] Date of Birth: [birth year] Referring Provider (PT): Marchia Bond MD   Encounter Date: 04/08/2020   PT End of Session - 04/08/20 1137    Visit Number 4    Number of Visits 18    Date for PT Re-Evaluation 05/12/20    Authorization Type Medicare    Progress Note Due on Visit 10    PT Start Time 1134    PT Stop Time 1212    PT Time Calculation (min) 38 min    Activity Tolerance Patient tolerated treatment well;Patient limited by fatigue    Behavior During Therapy [facility] for tasks assessed/performed           Past Medical History:  Diagnosis Date  . Anemia   . Anxiety   . Arthritis    Lt hip  . Cancer ([place]) 2020   skin  Rt leg   . Chronic bronchitis ([place])   . COPD (chronic obstructive pulmonary disease) ([place])   . Dyspnea   . GERD (gastroesophageal reflux disease)   . Hypertension   . Hypothyroidism   . Obesity (BMI 30-39.9) 12/21/2017  . OSA on CPAP 12/21/2017   Severe with AHI 29.4/h and oxygen desaturations as low as 75%.  She is now on CPAP at 6 cm water pressure with 2 L oxygen due to nocturnal hypoxemia  . Pelvic pressure in female 01/23/2014  . Positive fecal occult blood test 06/23/2015  . Rectocele 01/23/2014  . Scleroderma, limited ([place])   . Thyroid disease     Past Surgical History:  Procedure Laterality Date  . CESAREAN SECTION    . COLONOSCOPY N/A 09/11/2015   Procedure: COLONOSCOPY;  Surgeon: Rogene Houston, MD;  [facility];  Service: Endoscopy;  Laterality: N/A;  moved to 1/6 @ 1:35 - Ann to notify pt  . EYE SURGERY Bilateral 2021  . LUNG SURGERY Left    infection on outside of lung that had to be"scaped off".  Tildon Husky REPAIR N/A 01/02/2018   Procedure: POSTERIOR REPAIR (RECTOCELE);  Surgeon: Jonnie Kind, MD;   Location: [facility];  Service: Gynecology;  Laterality: N/A;  . TOTAL HIP ARTHROPLASTY Left 03/17/2020   Procedure: TOTAL HIP ARTHROPLASTY;  Surgeon: Marchia Bond, MD;  [facility];  Service: Orthopedics;  Laterality: Left;    There were no vitals filed for this visit.   Subjective Assessment - 04/08/20 1137    Subjective Pt arrived with SPC, reports she continues to use it and has started walking without cane for short duration at home.  No reports of pain today    Patient Stated Goals improve walking    Currently in Pain? No/denies                             [facility] Adult PT Treatment/Exercise - 04/08/20 0001      Ambulation/Gait   Ambulation/Gait Yes    Ambulation/Gait Assistance 6: Modified independent (Device/Increase time)    Ambulation Distance (Feet) 200 Feet    Assistive device Straight cane    Gait Pattern Step-through pattern;Trendelenburg    Ambulation Surface Level;Indoor    Gait Comments ambulates with cane with cueing for heel toe gait, foot clearance and proper use of cane      Exercises  Exercises Knee/Hip      Knee/Hip Exercises: Standing   Heel Raises 15 reps    Heel Raises Limitations incline slope    Lateral Step Up Both;2 sets;10 reps;Hand Hold: 1;Step Height: 4"    Forward Step Up 2 sets;10 reps;Right;Left;Hand Hold: 1;Step Height: 6"    Functional Squat 2 sets;15 reps    Functional Squat Limitations 2nd set without HHA    Rocker Board 2 minutes    Rocker Board Limitations lateral    SLS with Vectors 3x 5" with HHA    Gait Training 200 SPC good sequence, min cueing for heel strike and equal stance phase    Other Standing Knee Exercises Marching hip flexed <90 degrees. 2x 10 alternating legs    Other Standing Knee Exercises 2RT sidestep (RTB around thigh last set); partial tandem stance 2x 30" intermittent HHA                    PT Short Term Goals - 04/02/20 1118      PT SHORT TERM GOAL #1   Title Patient will be  independent with HEP in order to improve functional outcomes.    Time 3    Period Weeks    Status On-going    Target Date 04/21/20      PT SHORT TERM GOAL #2   Title Patient will report at least 25% improvement in symptoms for improved quality of life.    Time 3    Period Weeks    Status On-going    Target Date 04/21/20             PT Long Term Goals - 04/02/20 1119      PT LONG TERM GOAL #1   Title Patient will report at least 75% improvement in symptoms for improved quality of life.    Time 6    Period Weeks    Status On-going      PT LONG TERM GOAL #2   Title Patient will improve FOTO score by at least 10 points in order to indicate improved tolerance to activity.    Time 6    Period Weeks    Status On-going      PT LONG TERM GOAL #3   Title Patient will be able to navigate stairs with reciprocal pattern without compensation in order to demonstrate improved LE strength.    Time 6    Period Weeks    Status On-going                 Plan - 04/08/20 1251    Clinical Impression Statement Pt improved sequence and ease with SPC, min cueing for heel to toe mechanics and equal stride length.  Session focus on hip muscualture strenghtening and included static balance activities as well.  Pt tolerated well with no reports of pain through session, was limited by fatigue required a couple of seated rest breaks.  Was able to add theraband resistance around thigh wiht sidestep, tolerated well.    Comorbidities COPD, HTN    Examination-Activity Limitations Bathing;Bed Mobility;Bend;Caring for Others;Carry;Dressing;Hygiene/Grooming;Lift;Locomotion Level;Sleep;Squat;Stairs;Stand;Transfers    Examination-Participation Restrictions Cleaning;Yard Work;Shop;Volunteer    Stability/Clinical Decision Making Stable/Uncomplicated    Clinical Decision Making Low    Rehab Potential Good    PT Frequency 3x / week    PT Duration 6 weeks    PT Treatment/Interventions ADLs/Self Care Home  Management;Aquatic Therapy;Biofeedback;Cryotherapy;Electrical Stimulation;Moist Heat;Iontophoresis 4mg /ml Dexamethasone;Traction;Ultrasound;Parrafin;Fluidtherapy;Contrast Bath;DME Instruction;Gait training;Stair training;Functional mobility training;Therapeutic activities;Therapeutic exercise;Balance training;Neuromuscular re-education;Patient/family education;Orthotic Fit/Training;Manual  techniques;Manual lymph drainage;Compression bandaging;Scar mobilization;Passive range of motion;Dry needling;Energy conservation;Vasopneumatic Device;Splinting;Taping;Spinal Manipulations;Joint Manipulations    PT Next Visit Plan continue LE strengthing and ROM within precautions for posterolateral hip replacement, possibly progress from HHPT exercises if able and eliminate others as able, continue gait training with [facility]    PT Home Exercise Plan 03/31/20 HHPT exercises (ankle pumps, quad sets, glute squeezes, heel slides, abduction in supine, hamstring sets, SAQ, hip abduction in standing, and hamstring curls)           Patient will benefit from skilled therapeutic intervention in order to improve the following deficits and impairments:  Abnormal gait, Decreased activity tolerance, Decreased range of motion, Decreased strength, Decreased balance, Decreased mobility, Difficulty walking, Pain  Visit Diagnosis: Pain in left hip  Muscle weakness (generalized)  Other abnormalities of gait and mobility  Other symptoms and signs involving the musculoskeletal system     Problem List Patient Active Problem List   Diagnosis Date Noted  . 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 Osteoarthritis of left hip 03/17/2020  . Enterocele 01/02/2018  . OSA on CPAP 12/21/2017  . Obesity (BMI 30-39.9) 12/21/2017  . Depression 08/18/2015  . Positive fecal occult blood test 06/23/2015  . Pelvic pressure in female 01/23/2014  . Rectocele 01/23/2014   Ihor Austin, LPTA/CLT; CBIS [phone]  Aldona Lento 04/08/2020, 12:58 PM  [place] [address] [place], [place], [address] Phone: [phone]   Fax:  [phone]  Name: Regina Berry MRN: [id number] Date of Birth: [birth year]

## 2020-04-10 ENCOUNTER — Encounter (HOSPITAL_COMMUNITY): Payer: Self-pay

## 2020-04-10 ENCOUNTER — Ambulatory Visit (HOSPITAL_COMMUNITY): Payer: Medicare Other

## 2020-04-10 ENCOUNTER — Other Ambulatory Visit: Payer: Self-pay

## 2020-04-10 DIAGNOSIS — R29898 Other symptoms and signs involving the musculoskeletal system: Secondary | ICD-10-CM | POA: Diagnosis not present

## 2020-04-10 DIAGNOSIS — R2689 Other abnormalities of gait and mobility: Secondary | ICD-10-CM

## 2020-04-10 DIAGNOSIS — M25552 Pain in left hip: Secondary | ICD-10-CM

## 2020-04-10 DIAGNOSIS — M6281 Muscle weakness (generalized): Secondary | ICD-10-CM | POA: Diagnosis not present

## 2020-04-10 NOTE — Therapy (Signed)
[place], [place], [address] Phone: [phone]   Fax:  [phone]  Physical Therapy Treatment  Patient Details  Name: Regina Berry MRN: [id number] Date of Birth: 09/01/1951 Referring Provider (PT): Marchia Bond MD   Encounter Date: 04/10/2020   PT End of Session - 04/10/20 1054    Visit Number 5    Number of Visits 18    Date for PT Re-Evaluation 05/12/20    Authorization Type Medicare    Progress Note Due on Visit 10    PT Start Time 1048    PT Stop Time 1128    PT Time Calculation (min) 40 min    Activity Tolerance Patient tolerated treatment well;Patient limited by fatigue    Behavior During Therapy [facility] for tasks assessed/performed           Past Medical History:  Diagnosis Date  . Anemia   . Anxiety   . Arthritis    Lt hip  . Cancer ([place]) 2020   skin  Rt leg   . Chronic bronchitis ([place])   . COPD (chronic obstructive pulmonary disease) ([place])   . Dyspnea   . GERD (gastroesophageal reflux disease)   . Hypertension   . Hypothyroidism   . Obesity (BMI 30-39.9) 12/21/2017  . OSA on CPAP 12/21/2017   Severe with AHI 29.4/h and oxygen desaturations as low as 75%.  She is now on CPAP at 6 cm water pressure with 2 L oxygen due to nocturnal hypoxemia  . Pelvic pressure in female 01/23/2014  . Positive fecal occult blood test 06/23/2015  . Rectocele 01/23/2014  . Scleroderma, limited ([place])   . Thyroid disease     Past Surgical History:  Procedure Laterality Date  . CESAREAN SECTION    . COLONOSCOPY N/A 09/11/2015   Procedure: COLONOSCOPY;  Surgeon: Rogene Houston, MD;  [facility];  Service: Endoscopy;  Laterality: N/A;  moved to 1/6 @ 1:35 - Ann to notify pt  . EYE SURGERY Bilateral 2021  . LUNG SURGERY Left    infection on outside of lung that had to be"scaped off".  Tildon Husky REPAIR N/A 01/02/2018   Procedure: POSTERIOR REPAIR (RECTOCELE);  Surgeon: Jonnie Kind, MD;   Location: [facility] ORS;  Service: Gynecology;  Laterality: N/A;  . TOTAL HIP ARTHROPLASTY Left 03/17/2020   Procedure: TOTAL HIP ARTHROPLASTY;  Surgeon: Marchia Bond, MD;  [facility];  Service: Orthopedics;  Laterality: Left;    There were no vitals filed for this visit.   Subjective Assessment - 04/10/20 1051    Subjective No reports of pain, some discomfort when standing on Lt LE.  Reports she removed additional height with toilet seat and able to stand wihtout HHA.    Patient Stated Goals improve walking    Currently in Pain? No/denies                             [facility] Adult PT Treatment/Exercise - 04/10/20 0001      Exercises   Exercises Knee/Hip      Knee/Hip Exercises: Standing   Hip Abduction Stengthening;Both;15 reps;Knee straight    Hip Extension 15 reps;Knee straight    Extension Limitations cueing for ab set and contraction    Lateral Step Up Both;2 sets;10 reps;Hand Hold: 1;Step Height: 4"    Functional Squat 2 sets;15 reps    Functional Squat Limitations no HHA  Stairs 3RT 7in 1HR, increased difficulty ascending    SLS with Vectors 5x5" BLE 1 HHA    Gait Training 200 SPC good sequence, min cueing for heel strike and equal stance phase    Other Standing Knee Exercises Marching hip flexed <90 degrees. 2x 10 alternating legs    Other Standing Knee Exercises 2RT sidestep RTB; tandem 1x solid then 3x 30" of foam                    PT Short Term Goals - 04/02/20 1118      PT SHORT TERM GOAL #1   Title Patient will be independent with HEP in order to improve functional outcomes.    Time 3    Period Weeks    Status On-going    Target Date 04/21/20      PT SHORT TERM GOAL #2   Title Patient will report at least 25% improvement in symptoms for improved quality of life.    Time 3    Period Weeks    Status On-going    Target Date 04/21/20             PT Long Term Goals - 04/02/20 1119      PT LONG TERM GOAL #1   Title Patient  will report at least 75% improvement in symptoms for improved quality of life.    Time 6    Period Weeks    Status On-going      PT LONG TERM GOAL #2   Title Patient will improve FOTO score by at least 10 points in order to indicate improved tolerance to activity.    Time 6    Period Weeks    Status On-going      PT LONG TERM GOAL #3   Title Patient will be able to navigate stairs with reciprocal pattern without compensation in order to demonstrate improved LE strength.    Time 6    Period Weeks    Status On-going                 Plan - 04/10/20 1614    Clinical Impression Statement Pt arrived with good mechanics with SPC, continues to have difficulty with Lt LE stance phase though is improving.  Session focus on hip functional strengthening and progressed static balance activities.  Added stair training, pt able to demonstrate reciprocal pattern though increased difficulty ascending stairs.  Continued quad and gluteal strengthening with good tolerance though was limited by fatigue with task.  Good static balance with tandem stance, added dynamic surface with intermittent HHA required.  No reports of increased pain, was limited by fatiuge at [place].    Personal Factors and Comorbidities Comorbidity 2;Age;Behavior Pattern;Time since onset of injury/illness/exacerbation;Fitness    Comorbidities COPD, HTN    Examination-Activity Limitations Bathing;Bed Mobility;Bend;Caring for Others;Carry;Dressing;Hygiene/Grooming;Lift;Locomotion Level;Sleep;Squat;Stairs;Stand;Transfers    Examination-Participation Restrictions Cleaning;Yard Work;Shop;Volunteer    Stability/Clinical Decision Making Stable/Uncomplicated    Clinical Decision Making Low    Rehab Potential Good    PT Frequency 3x / week    PT Duration 6 weeks    PT Treatment/Interventions ADLs/Self Care Home Management;Aquatic Therapy;Biofeedback;Cryotherapy;Electrical Stimulation;Moist Heat;Iontophoresis 4mg /ml  Dexamethasone;Traction;Ultrasound;Parrafin;Fluidtherapy;Contrast Bath;DME Instruction;Gait training;Stair training;Functional mobility training;Therapeutic activities;Therapeutic exercise;Balance training;Neuromuscular re-education;Patient/family education;Orthotic Fit/Training;Manual techniques;Manual lymph drainage;Compression bandaging;Scar mobilization;Passive range of motion;Dry needling;Energy conservation;Vasopneumatic Device;Splinting;Taping;Spinal Manipulations;Joint Manipulations    PT Next Visit Plan continue LE strengthing and ROM within precautions for posterolateral hip replacement, possibly progress from HHPT exercises if able and eliminate others as able,  continue gait training with [facility]    PT Home Exercise Plan 03/31/20 HHPT exercises (ankle pumps, quad sets, glute squeezes, heel slides, abduction in supine, hamstring sets, SAQ, hip abduction in standing, and hamstring curls)           Patient will benefit from skilled therapeutic intervention in order to improve the following deficits and impairments:  Abnormal gait, Decreased activity tolerance, Decreased range of motion, Decreased strength, Decreased balance, Decreased mobility, Difficulty walking, Pain  Visit Diagnosis: Muscle weakness (generalized)  Other abnormalities of gait and mobility  Other symptoms and signs involving the musculoskeletal system  Pain in left hip     Problem List Patient Active Problem List   Diagnosis Date Noted  . 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 Osteoarthritis of left hip 03/17/2020  . Enterocele 01/02/2018  . OSA on CPAP 12/21/2017  . Obesity (BMI 30-39.9) 12/21/2017  . Depression 08/18/2015  . Positive fecal occult blood test 06/23/2015  . Pelvic pressure in female 01/23/2014  . Rectocele 01/23/2014   Ihor Austin, LPTA/CLT; CBIS [phone]  Aldona Lento 04/10/2020, 4:46 PM  [place], [place], [address] Phone: [phone]   Fax:  [phone]  Name: Regina Berry MRN: [id number] Date of Birth: [birth year]

## 2020-04-13 ENCOUNTER — Other Ambulatory Visit: Payer: Self-pay

## 2020-04-13 ENCOUNTER — Ambulatory Visit (HOSPITAL_COMMUNITY): Payer: Medicare Other | Admitting: Physical Therapy

## 2020-04-13 ENCOUNTER — Encounter (HOSPITAL_COMMUNITY): Payer: Self-pay | Admitting: Physical Therapy

## 2020-04-13 DIAGNOSIS — R2689 Other abnormalities of gait and mobility: Secondary | ICD-10-CM | POA: Diagnosis not present

## 2020-04-13 DIAGNOSIS — R29898 Other symptoms and signs involving the musculoskeletal system: Secondary | ICD-10-CM | POA: Diagnosis not present

## 2020-04-13 DIAGNOSIS — M25552 Pain in left hip: Secondary | ICD-10-CM | POA: Diagnosis not present

## 2020-04-13 DIAGNOSIS — M6281 Muscle weakness (generalized): Secondary | ICD-10-CM | POA: Diagnosis not present

## 2020-04-13 NOTE — Therapy (Signed)
[place], [place], [address] Phone: [phone]   Fax:  [phone]  Physical Therapy Treatment  Patient Details  Name: Regina Berry MRN: [id number] Date of Birth: 11/26/1950 Referring Provider (PT): Marchia Bond MD   Encounter Date: 04/13/2020   PT End of Session - 04/13/20 1132    Visit Number 6    Number of Visits 18    Date for PT Re-Evaluation 05/12/20    Authorization Type Medicare    Progress Note Due on Visit 10    PT Start Time 1132    PT Stop Time 1210    PT Time Calculation (min) 38 min    Activity Tolerance Patient tolerated treatment well;Patient limited by fatigue    Behavior During Therapy [facility] for tasks assessed/performed           Past Medical History:  Diagnosis Date  . Anemia   . Anxiety   . Arthritis    Lt hip  . Cancer ([place]) 2020   skin  Rt leg   . Chronic bronchitis ([place])   . COPD (chronic obstructive pulmonary disease) ([place])   . Dyspnea   . GERD (gastroesophageal reflux disease)   . Hypertension   . Hypothyroidism   . Obesity (BMI 30-39.9) 12/21/2017  . OSA on CPAP 12/21/2017   Severe with AHI 29.4/h and oxygen desaturations as low as 75%.  She is now on CPAP at 6 cm water pressure with 2 L oxygen due to nocturnal hypoxemia  . Pelvic pressure in female 01/23/2014  . Positive fecal occult blood test 06/23/2015  . Rectocele 01/23/2014  . Scleroderma, limited ([place])   . Thyroid disease     Past Surgical History:  Procedure Laterality Date  . CESAREAN SECTION    . COLONOSCOPY N/A 09/11/2015   Procedure: COLONOSCOPY;  Surgeon: Rogene Houston, MD;  [facility];  Service: Endoscopy;  Laterality: N/A;  moved to 1/6 @ 1:35 - Ann to notify pt  . EYE SURGERY Bilateral 2021  . LUNG SURGERY Left    infection on outside of lung that had to be"scaped off".  Tildon Husky REPAIR N/A 01/02/2018   Procedure: POSTERIOR REPAIR (RECTOCELE);  Surgeon: Jonnie Kind, MD;   Location: [facility];  Service: Gynecology;  Laterality: N/A;  . TOTAL HIP ARTHROPLASTY Left 03/17/2020   Procedure: TOTAL HIP ARTHROPLASTY;  Surgeon: Marchia Bond, MD;  [facility];  Service: Orthopedics;  Laterality: Left;    There were no vitals filed for this visit.   Subjective Assessment - 04/13/20 1137    Subjective States she felt fine after last session. States she has been doing her exercises 2x/day except for yesterday as she felt she took a day off. States after she walks a bit she is not as tender. States that putting weight on her leg is painful and she is hesitant to put her weight on it in the morning because she is afraid it is going to hurt.    Patient Stated Goals improve walking    Currently in Pain? No/denies              [facility] PT Assessment - 04/13/20 0001      Assessment   Medical Diagnosis L THA    Referring Provider (PT) Marchia Bond MD                         [facility] Adult PT Treatment/Exercise -  04/13/20 0001      Ambulation/Gait   Gait Comments on posture, on mechanics and walking fast - 6 minutes total       Knee/Hip Exercises: Standing   Other Standing Knee Exercises SLS with one hand support 2x5 10" holds B; tandem on floor 3x30" B no UE support     Other Standing Knee Exercises weight shifts to left in // bars - x10 10" holds ; lateral steps with pulley 2x5 facing both directions.  - one plate      Knee/Hip Exercises: Seated   Sit to Sand without UE support;5 reps   belt around knees, elevated seat (black foam), 5 sets                 PT Education - 04/13/20 1154    Education Details answered questions about precautions, current functional presentation and HEP.    Person(s) Educated Patient    Methods Explanation    Comprehension Verbalized understanding            PT Short Term Goals - 04/02/20 1118      PT SHORT TERM GOAL #1   Title Patient will be independent with HEP in order to improve functional outcomes.     Time 3    Period Weeks    Status On-going    Target Date 04/21/20      PT SHORT TERM GOAL #2   Title Patient will report at least 25% improvement in symptoms for improved quality of life.    Time 3    Period Weeks    Status On-going    Target Date 04/21/20             PT Long Term Goals - 04/02/20 1119      PT LONG TERM GOAL #1   Title Patient will report at least 75% improvement in symptoms for improved quality of life.    Time 6    Period Weeks    Status On-going      PT LONG TERM GOAL #2   Title Patient will improve FOTO score by at least 10 points in order to indicate improved tolerance to activity.    Time 6    Period Weeks    Status On-going      PT LONG TERM GOAL #3   Title Patient will be able to navigate stairs with reciprocal pattern without compensation in order to demonstrate improved LE strength.    Time 6    Period Weeks    Status On-going                 Plan - 04/13/20 1201    Clinical Impression Statement Focused on balance and weight shifts which helped with tenderness in hip. Added elevated sit to stand and this was tolerated well. Patient concerned about breaking precautions, reassured patient and answered all questions about concerns. No increase in pain or symptoms noted during today's session. Will continue with current POC.    Personal Factors and Comorbidities Comorbidity 2;Age;Behavior Pattern;Time since onset of injury/illness/exacerbation;Fitness    Comorbidities COPD, HTN    Examination-Activity Limitations Bathing;Bed Mobility;Bend;Caring for Others;Carry;Dressing;Hygiene/Grooming;Lift;Locomotion Level;Sleep;Squat;Stairs;Stand;Transfers    Examination-Participation Restrictions Cleaning;Yard Work;Shop;Volunteer    Stability/Clinical Decision Making Stable/Uncomplicated    Rehab Potential Good    PT Frequency 3x / week    PT Duration 6 weeks    PT Treatment/Interventions ADLs/Self Care Home Management;Aquatic  Therapy;Biofeedback;Cryotherapy;Electrical Stimulation;Moist Heat;Iontophoresis 4mg /ml Dexamethasone;Traction;Ultrasound;Parrafin;Fluidtherapy;Contrast Bath;DME Instruction;Gait training;Stair training;Functional mobility training;Therapeutic activities;Therapeutic exercise;Balance training;Neuromuscular re-education;Patient/family  education;Orthotic Fit/Training;Manual techniques;Manual lymph drainage;Compression bandaging;Scar mobilization;Passive range of motion;Dry needling;Energy conservation;Vasopneumatic Device;Splinting;Taping;Spinal Manipulations;Joint Manipulations    PT Next Visit Plan f/u with posture/ walking mechancis (shifts weight to R to clear L LE during swing) continue LE strengthing and ROM within precautions for posterolateral hip replacement, possibly progress from HHPT exercises if able and eliminate others as able, continue gait training with [facility]    PT Home Exercise Plan 03/31/20 HHPT exercises (ankle pumps, quad sets, glute squeezes, heel slides, abduction in supine, hamstring sets, SAQ, hip abduction in standing, and hamstring curls), STS from elevated surface    Consulted and Agree with Plan of Care Patient           Patient will benefit from skilled therapeutic intervention in order to improve the following deficits and impairments:  Abnormal gait, Decreased activity tolerance, Decreased range of motion, Decreased strength, Decreased balance, Decreased mobility, Difficulty walking, Pain  Visit Diagnosis: Muscle weakness (generalized)  Other abnormalities of gait and mobility  Other symptoms and signs involving the musculoskeletal system  Pain in left hip     Problem List Patient Active Problem List   Diagnosis Date Noted  . 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 Osteoarthritis of left hip 03/17/2020  . Enterocele 01/02/2018  . OSA on CPAP 12/21/2017  . Obesity (BMI 30-39.9) 12/21/2017  . Depression 08/18/2015  . Positive fecal occult blood  test 06/23/2015  . Pelvic pressure in female 01/23/2014  . Rectocele 01/23/2014   12:14 PM, 04/13/20 Jerene Pitch, DPT Physical Therapy with [facility]  [phone] office  [place] [address] [place], [place], [address] Phone: [phone]   Fax:  [phone]  Name: LYLY CANIZALES MRN: [id number] Date of Birth: November 23, 1950

## 2020-04-15 ENCOUNTER — Ambulatory Visit (HOSPITAL_COMMUNITY): Payer: Medicare Other

## 2020-04-15 ENCOUNTER — Other Ambulatory Visit: Payer: Self-pay

## 2020-04-15 ENCOUNTER — Encounter (HOSPITAL_COMMUNITY): Payer: Self-pay

## 2020-04-15 DIAGNOSIS — R2689 Other abnormalities of gait and mobility: Secondary | ICD-10-CM | POA: Diagnosis not present

## 2020-04-15 DIAGNOSIS — M6281 Muscle weakness (generalized): Secondary | ICD-10-CM | POA: Diagnosis not present

## 2020-04-15 DIAGNOSIS — R29898 Other symptoms and signs involving the musculoskeletal system: Secondary | ICD-10-CM

## 2020-04-15 DIAGNOSIS — M25552 Pain in left hip: Secondary | ICD-10-CM

## 2020-04-15 NOTE — Therapy (Signed)
[place], [place], [address] Phone: [phone]   Fax:  [phone]  Physical Therapy Treatment  Patient Details  Name: Regina Berry MRN: [id number] Date of Birth: 01/25/1951 Referring Provider (PT): Marchia Bond MD   Encounter Date: 04/15/2020   PT End of Session - 04/15/20 1102    Visit Number 7    Number of Visits 18    Date for PT Re-Evaluation 05/12/20    Authorization Type Medicare    Progress Note Due on Visit 10    PT Start Time 1053   late arrival   PT Stop Time 1131    PT Time Calculation (min) 38 min    Activity Tolerance Patient tolerated treatment well;Patient limited by fatigue    Behavior During Therapy [facility] for tasks assessed/performed           Past Medical History:  Diagnosis Date   Anemia    Anxiety    Arthritis    Lt hip   Cancer ([place]) 2020   skin  Rt leg    Chronic bronchitis ([facility])    COPD (chronic obstructive pulmonary disease) ([place])    Dyspnea    GERD (gastroesophageal reflux disease)    Hypertension    Hypothyroidism    Obesity (BMI 30-39.9) 12/21/2017   OSA on CPAP 12/21/2017   Severe with AHI 29.4/h and oxygen desaturations as low as 75%.  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   RECTOCELE REPAIR N/A 01/02/2018   Procedure: POSTERIOR REPAIR (RECTOCELE);  Surgeon: Jonnie Kind, MD;  Location: [facility];  Service: Gynecology;  Laterality: N/A;   TOTAL HIP ARTHROPLASTY Left 03/17/2020   Procedure: TOTAL HIP ARTHROPLASTY;  Surgeon: Marchia Bond, MD;  [facility];  Service: Orthopedics;  Laterality: Left;    There were no vitals filed for this visit.   Subjective Assessment - 04/15/20 1101    Subjective Pt stated she was super sore following last session, no real pain.  Has been walking around the house without AD, continues to use while walking outdoors or long duration.    Patient Stated Goals improve walking    Currently in Pain? No/denies                             [facility] Adult PT Treatment/Exercise - 04/15/20 0001      Ambulation/Gait   Gait Comments on posture, on mechanics and walking fast with mirror feed baci      Knee/Hip Exercises: Standing   Functional Squat 10 reps    Rocker Board 2 minutes    Rocker Board Limitations lateral    SLS SLS 3" max    Other Standing Knee Exercises 1plate 4 sidestep then 5 palloff  10x each direction; tandem stance on foam 3x30 "    Other Standing Knee Exercises 2RT sidestep, retro gait down long hallway                    PT Short Term Goals - 04/02/20 1118      PT SHORT TERM GOAL #1   Title Patient will be independent with HEP in order to improve functional outcomes.    Time 3    Period Weeks    Status On-going    Target Date 04/21/20      PT SHORT TERM GOAL #2   Title Patient will report at least 25% improvement in symptoms for improved quality of life.    Time 3    Period Weeks    Status On-going    Target Date 04/21/20             PT Long Term Goals - 04/02/20 1119      PT LONG TERM GOAL #1   Title Patient will report at least 75% improvement in symptoms for improved quality of life.    Time 6    Period Weeks    Status On-going      PT LONG TERM GOAL #2   Title Patient will improve FOTO score by at least 10 points in order to indicate improved tolerance to  activity.    Time 6    Period Weeks    Status On-going      PT LONG TERM GOAL #3   Title Patient will be able to navigate stairs with reciprocal pattern without compensation in order to demonstrate improved LE strength.    Time 6    Period Weeks    Status On-going                 Plan - 04/15/20 1151    Clinical Impression Statement Session focus with balance and weight bearing activities to improve gait mechanics.  Added retro gait for gluteal strengthening and cueing to completely slowly to address SLS with task.  Pt continues to have some difficulty with SLS, NBOS and dynamic surfaces, required some HHA during all balance activities.    Personal Factors and Comorbidities Comorbidity 2;Age;Behavior Pattern;Time since onset of injury/illness/exacerbation;Fitness    Comorbidities COPD, HTN    Examination-Activity Limitations Bathing;Bed Mobility;Bend;Caring for Others;Carry;Dressing;Hygiene/Grooming;Lift;Locomotion Level;Sleep;Squat;Stairs;Stand;Transfers    Examination-Participation Restrictions Cleaning;Yard Work;Shop;Volunteer    Stability/Clinical Decision Making Stable/Uncomplicated    Clinical Decision Making Low    Rehab Potential Good    PT Frequency 3x / week    PT Duration 6 weeks    PT Treatment/Interventions ADLs/Self Care Home Management;Aquatic Therapy;Biofeedback;Cryotherapy;Electrical Stimulation;Moist Heat;Iontophoresis 4mg /ml Dexamethasone;Traction;Ultrasound;Parrafin;Fluidtherapy;Contrast Bath;DME Instruction;Gait training;Stair training;Functional mobility training;Therapeutic activities;Therapeutic exercise;Balance training;Neuromuscular re-education;Patient/family education;Orthotic Fit/Training;Manual techniques;Manual lymph drainage;Compression bandaging;Scar mobilization;Passive range of motion;Dry needling;Energy conservation;Vasopneumatic Device;Splinting;Taping;Spinal Manipulations;Joint Manipulations    PT Next Visit Plan f/u with posture/ walking  mechancis (shifts weight to R to clear L LE during swing) continue LE strengthing and ROM within precautions for posterolateral hip replacement, possibly progress from HHPT exercises if able and eliminate others as able, continue gait training with [facility]    PT Home Exercise Plan 03/31/20 HHPT exercises (ankle pumps, quad sets, glute squeezes, heel slides, abduction in supine, hamstring sets, SAQ, hip abduction in standing, and hamstring curls), STS from elevated surface           Patient will benefit from skilled therapeutic intervention in order to improve the following deficits and impairments:  Abnormal gait, Decreased  activity tolerance, Decreased range of motion, Decreased strength, Decreased balance, Decreased mobility, Difficulty walking, Pain  Visit Diagnosis: Muscle weakness (generalized)  Other abnormalities of gait and mobility  Other symptoms and signs involving the musculoskeletal system  Pain in left hip     Problem List Patient Active Problem List   Diagnosis Date Noted   Status post total hip replacement, 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   Osteoarthritis of left hip 03/17/2020   Enterocele 01/02/2018   OSA on CPAP 12/21/2017   Obesity (BMI 30-39.9) 12/21/2017   Depression 08/18/2015   Positive fecal occult blood test 06/23/2015   Pelvic pressure in female 01/23/2014   Rectocele 01/23/2014   Ihor Austin, LPTA/CLT; CBIS [phone]  Aldona Lento 04/15/2020, 11:56 AM  [place], [place], [address] Phone: [phone]   Fax:  [phone]  Name: Regina Berry MRN: [id number] Date of Birth: 1951-09-03

## 2020-04-17 ENCOUNTER — Ambulatory Visit (HOSPITAL_COMMUNITY): Payer: Medicare Other

## 2020-04-17 ENCOUNTER — Telehealth (HOSPITAL_COMMUNITY): Payer: Self-pay

## 2020-04-17 NOTE — Telephone Encounter (Signed)
pt cancelled appt for today, no reason given 

## 2020-04-19 DIAGNOSIS — Z471 Aftercare following joint replacement surgery: Secondary | ICD-10-CM | POA: Diagnosis not present

## 2020-04-20 ENCOUNTER — Encounter (HOSPITAL_COMMUNITY): Payer: Self-pay | Admitting: Physical Therapy

## 2020-04-20 ENCOUNTER — Other Ambulatory Visit: Payer: Self-pay

## 2020-04-20 ENCOUNTER — Ambulatory Visit (HOSPITAL_COMMUNITY): Payer: Medicare Other | Admitting: Physical Therapy

## 2020-04-20 DIAGNOSIS — R2689 Other abnormalities of gait and mobility: Secondary | ICD-10-CM | POA: Diagnosis not present

## 2020-04-20 DIAGNOSIS — M6281 Muscle weakness (generalized): Secondary | ICD-10-CM | POA: Diagnosis not present

## 2020-04-20 DIAGNOSIS — R29898 Other symptoms and signs involving the musculoskeletal system: Secondary | ICD-10-CM | POA: Diagnosis not present

## 2020-04-20 DIAGNOSIS — M25552 Pain in left hip: Secondary | ICD-10-CM | POA: Diagnosis not present

## 2020-04-20 NOTE — Therapy (Signed)
[place], [place], [address] Phone: [phone]   Fax:  [phone]  Physical Therapy Treatment  Patient Details  Name: Regina Berry MRN: [id number] Date of Birth: Sep 01, 1951 Referring Provider (PT): Marchia Bond MD   Encounter Date: 04/20/2020   PT End of Session - 04/20/20 1135    Visit Number 8    Number of Visits 18    Date for PT Re-Evaluation 05/12/20    Authorization Type Medicare    Progress Note Due on Visit 10    PT Start Time 1134    PT Stop Time 1213    PT Time Calculation (min) 39 min    Activity Tolerance Patient tolerated treatment well;Patient limited by fatigue    Behavior During Therapy [facility] for tasks assessed/performed           Past Medical History:  Diagnosis Date  . Anemia   . Anxiety   . Arthritis    Lt hip  . Cancer ([place]) 2020   skin  Rt leg   . Chronic bronchitis ([place])   . COPD (chronic obstructive pulmonary disease) ([place])   . Dyspnea   . GERD (gastroesophageal reflux disease)   . Hypertension   . Hypothyroidism   . Obesity (BMI 30-39.9) 12/21/2017  . OSA on CPAP 12/21/2017   Severe with AHI 29.4/h and oxygen desaturations as low as 75%.  She is now on CPAP at 6 cm water pressure with 2 L oxygen due to nocturnal hypoxemia  . Pelvic pressure in female 01/23/2014  . Positive fecal occult blood test 06/23/2015  . Rectocele 01/23/2014  . Scleroderma, limited ([place])   . Thyroid disease     Past Surgical History:  Procedure Laterality Date  . CESAREAN SECTION    . COLONOSCOPY N/A 09/11/2015   Procedure: COLONOSCOPY;  Surgeon: Rogene Houston, MD;  [facility];  Service: Endoscopy;  Laterality: N/A;  moved to 1/6 @ 1:35 - Ann to notify pt  . EYE SURGERY Bilateral 2021  . LUNG SURGERY Left    infection on outside of lung that had to be"scaped off".  Tildon Husky REPAIR N/A 01/02/2018   Procedure: POSTERIOR REPAIR (RECTOCELE);  Surgeon: Jonnie Kind, MD;   Location: [facility]S;  Service: Gynecology;  Laterality: N/A;  . TOTAL HIP ARTHROPLASTY Left 03/17/2020   Procedure: TOTAL HIP ARTHROPLASTY;  Surgeon: Marchia Bond, MD;  [facility];  Service: Orthopedics;  Laterality: Left;    There were no vitals filed for this visit.   Subjective Assessment - 04/20/20 1140    Subjective Reports she is about 2/10 pain and wasn't as sore after last session as she was the previous session.    Patient Stated Goals improve walking    Currently in Pain? Yes    Pain Score 2     Pain Location Hip    Pain Orientation Left    Pain Descriptors / Indicators Aching;Sore              [facility] PT Assessment - 04/20/20 0001      Assessment   Medical Diagnosis L THA    Referring Provider (PT) Marchia Bond MD                         [facility] Adult PT Treatment/Exercise - 04/20/20 0001      Ambulation/Gait   Ambulation/Gait Yes    Ambulation/Gait Assistance 6: Modified independent (Device/Increase time)  Ambulation Distance (Feet) 226 Feet    Assistive device None    Gait Pattern Step-through pattern;Trendelenburg    Ambulation Surface Level;Indoor    Gait Comments 2 laps - focus on keeping level hips and decreased frontal plane motion       Knee/Hip Exercises: Standing   Forward Step Up 3 sets;Step Height: 4";10 reps;Hand Hold: 1    Other Standing Knee Exercises SLS focus on level hips 2x5 10" holds 1 finger support     Other Standing Knee Exercises tandem 3x30" then with head rotations 3x5 B                     PT Short Term Goals - 04/02/20 1118      PT SHORT TERM GOAL #1   Title Patient will be independent with HEP in order to improve functional outcomes.    Time 3    Period Weeks    Status On-going    Target Date 04/21/20      PT SHORT TERM GOAL #2   Title Patient will report at least 25% improvement in symptoms for improved quality of life.    Time 3    Period Weeks    Status On-going    Target Date 04/21/20              PT Long Term Goals - 04/02/20 1119      PT LONG TERM GOAL #1   Title Patient will report at least 75% improvement in symptoms for improved quality of life.    Time 6    Period Weeks    Status On-going      PT LONG TERM GOAL #2   Title Patient will improve FOTO score by at least 10 points in order to indicate improved tolerance to activity.    Time 6    Period Weeks    Status On-going      PT LONG TERM GOAL #3   Title Patient will be able to navigate stairs with reciprocal pattern without compensation in order to demonstrate improved LE strength.    Time 6    Period Weeks    Status On-going                 Plan - 04/20/20 1135    Clinical Impression Statement Continued focus on LE strengthening and gait training on this date. Patient able to correct Trendelenburg gait when focusing on mechanics and not fatigued. Instructed patient to focus on this moving forward. No increase in pain or discomfort noted just soreness end of session.    Personal Factors and Comorbidities Comorbidity 2;Age;Behavior Pattern;Time since onset of injury/illness/exacerbation;Fitness    Comorbidities COPD, HTN    Examination-Activity Limitations Bathing;Bed Mobility;Bend;Caring for Others;Carry;Dressing;Hygiene/Grooming;Lift;Locomotion Level;Sleep;Squat;Stairs;Stand;Transfers    Examination-Participation Restrictions Cleaning;Yard Work;Shop;Volunteer    Stability/Clinical Decision Making Stable/Uncomplicated    Rehab Potential Good    PT Frequency 3x / week    PT Duration 6 weeks    PT Treatment/Interventions ADLs/Self Care Home Management;Aquatic Therapy;Biofeedback;Cryotherapy;Electrical Stimulation;Moist Heat;Iontophoresis 4mg /ml Dexamethasone;Traction;Ultrasound;Parrafin;Fluidtherapy;Contrast Bath;DME Instruction;Gait training;Stair training;Functional mobility training;Therapeutic activities;Therapeutic exercise;Balance training;Neuromuscular re-education;Patient/family  education;Orthotic Fit/Training;Manual techniques;Manual lymph drainage;Compression bandaging;Scar mobilization;Passive range of motion;Dry needling;Energy conservation;Vasopneumatic Device;Splinting;Taping;Spinal Manipulations;Joint Manipulations    PT Next Visit Plan f/u with posture/ walking mechanics (shifts weight to R to clear L LE during swing) continue LE strengthing and ROM within precautions for posterolateral hip    PT Home Exercise Plan 03/31/20 HHPT exercises (ankle pumps, quad sets, glute squeezes, heel slides, abduction in  supine, hamstring sets, SAQ, hip abduction in standing, and hamstring curls), STS from elevated surface           Patient will benefit from skilled therapeutic intervention in order to improve the following deficits and impairments:  Abnormal gait, Decreased activity tolerance, Decreased range of motion, Decreased strength, Decreased balance, Decreased mobility, Difficulty walking, Pain  Visit Diagnosis: Muscle weakness (generalized)  Other abnormalities of gait and mobility  Other symptoms and signs involving the musculoskeletal system     Problem List Patient Active Problem List   Diagnosis Date Noted  . 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 Osteoarthritis of left hip 03/17/2020  . Enterocele 01/02/2018  . OSA on CPAP 12/21/2017  . Obesity (BMI 30-39.9) 12/21/2017  . Depression 08/18/2015  . Positive fecal occult blood test 06/23/2015  . Pelvic pressure in female 01/23/2014  . Rectocele 01/23/2014  12:24 PM, 04/20/20 Jerene Pitch, DPT Physical Therapy with [facility]  [phone] office  [place] [address] [place], [place], [address] Phone: [phone]   Fax:  [phone]  Name: MAKAYLIA HEWETT MRN: [id number] Date of Birth: Feb 03, 1951

## 2020-04-22 ENCOUNTER — Ambulatory Visit (HOSPITAL_COMMUNITY): Payer: Medicare Other | Admitting: Physical Therapy

## 2020-04-22 ENCOUNTER — Other Ambulatory Visit: Payer: Self-pay

## 2020-04-22 DIAGNOSIS — M6281 Muscle weakness (generalized): Secondary | ICD-10-CM

## 2020-04-22 DIAGNOSIS — R2689 Other abnormalities of gait and mobility: Secondary | ICD-10-CM | POA: Diagnosis not present

## 2020-04-22 DIAGNOSIS — R29898 Other symptoms and signs involving the musculoskeletal system: Secondary | ICD-10-CM | POA: Diagnosis not present

## 2020-04-22 DIAGNOSIS — M25552 Pain in left hip: Secondary | ICD-10-CM | POA: Diagnosis not present

## 2020-04-22 NOTE — Therapy (Signed)
[place], [place], [address] Phone: [phone]   Fax:  [phone]  Physical Therapy Treatment  Patient Details  Name: Regina Berry MRN: [id number] Date of Birth: Nov 03, 1950 Referring Provider (PT): Marchia Bond MD   Encounter Date: 04/22/2020   PT End of Session - 04/22/20 1051    Visit Number 9    Number of Visits 18    Date for PT Re-Evaluation 05/12/20    Authorization Type Medicare    Progress Note Due on Visit 10    PT Start Time 1005    PT Stop Time 1048    PT Time Calculation (min) 43 min    Activity Tolerance Patient tolerated treatment well;Patient limited by fatigue    Behavior During Therapy [facility] for tasks assessed/performed           Past Medical History:  Diagnosis Date  . Anemia   . Anxiety   . Arthritis    Lt hip  . Cancer ([place]) 2020   skin  Rt leg   . Chronic bronchitis ([place])   . COPD (chronic obstructive pulmonary disease) ([place])   . Dyspnea   . GERD (gastroesophageal reflux disease)   . Hypertension   . Hypothyroidism   . Obesity (BMI 30-39.9) 12/21/2017  . OSA on CPAP 12/21/2017   Severe with AHI 29.4/h and oxygen desaturations as low as 75%.  She is now on CPAP at 6 cm water pressure with 2 L oxygen due to nocturnal hypoxemia  . Pelvic pressure in female 01/23/2014  . Positive fecal occult blood test 06/23/2015  . Rectocele 01/23/2014  . 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 Thyroid disease     Past Surgical History:  Procedure Laterality Date  . CESAREAN SECTION    . COLONOSCOPY N/A 09/11/2015   Procedure: COLONOSCOPY;  Surgeon: Rogene Houston, MD;  [facility];  Service: Endoscopy;  Laterality: N/A;  moved to 1/6 @ 1:35 - Ann to notify pt  . EYE SURGERY Bilateral 2021  . LUNG SURGERY Left    infection on outside of lung that had to be"scaped off".  Tildon Husky REPAIR N/A 01/02/2018   Procedure: POSTERIOR REPAIR (RECTOCELE);  Surgeon: Jonnie Kind, MD;   Location: [facility];  Service: Gynecology;  Laterality: N/A;  . TOTAL HIP ARTHROPLASTY Left 03/17/2020   Procedure: TOTAL HIP ARTHROPLASTY;  Surgeon: Marchia Bond, MD;  [facility];  Service: Orthopedics;  Laterality: Left;    There were no vitals filed for this visit.   Subjective Assessment - 04/22/20 1008    Subjective pt states she only has pain in her leg when she puts weight through it.  0/10 at rest,                             [facility] Adult PT Treatment/Exercise - 04/22/20 0001      Knee/Hip Exercises: Standing   Hip Abduction Stengthening;2 sets;15 reps    Hip Extension Stengthening;2 sets;15 reps    Lateral Step Up Both;2 sets;10 reps;Step Height: 4";Hand Hold: 1    Lateral Step Up Limitations eccentric control    SLS with Vectors 2X10 reps 5" holds and 1 HHA    Other Standing Knee Exercises 6" max of 5-10 trials    Other Standing Knee Exercises hip hiking 2X10 reps each LE  PT Short Term Goals - 04/02/20 1118      PT SHORT TERM GOAL #1   Title Patient will be independent with HEP in order to improve functional outcomes.    Time 3    Period Weeks    Status On-going    Target Date 04/21/20      PT SHORT TERM GOAL #2   Title Patient will report at least 25% improvement in symptoms for improved quality of life.    Time 3    Period Weeks    Status On-going    Target Date 04/21/20             PT Long Term Goals - 04/02/20 1119      PT LONG TERM GOAL #1   Title Patient will report at least 75% improvement in symptoms for improved quality of life.    Time 6    Period Weeks    Status On-going      PT LONG TERM GOAL #2   Title Patient will improve FOTO score by at least 10 points in order to indicate improved tolerance to activity.    Time 6    Period Weeks    Status On-going      PT LONG TERM GOAL #3   Title Patient will be able to navigate stairs with reciprocal pattern without compensation in order to  demonstrate improved LE strength.    Time 6    Period Weeks    Status On-going                 Plan - 04/22/20 1048    Clinical Impression Statement continued focus on LE strengthening, specifically glutes, hip abd and QL as she presents with trendelenburg gait.   Updated HEP to include hip hiking and SLS as she is challenged by these 2 activites most.  Pt reporteed no pain or issues at end of session.  REturns to MD next week (25th).    Personal Factors and Comorbidities Comorbidity 2;Age;Behavior Pattern;Time since onset of injury/illness/exacerbation;Fitness    Comorbidities COPD, HTN    Examination-Activity Limitations Bathing;Bed Mobility;Bend;Caring for Others;Carry;Dressing;Hygiene/Grooming;Lift;Locomotion Level;Sleep;Squat;Stairs;Stand;Transfers    Examination-Participation Restrictions Cleaning;Yard Work;Shop;Volunteer    Stability/Clinical Decision Making Stable/Uncomplicated    Rehab Potential Good    PT Frequency 3x / week    PT Duration 6 weeks    PT Treatment/Interventions ADLs/Self Care Home Management;Aquatic Therapy;Biofeedback;Cryotherapy;Electrical Stimulation;Moist Heat;Iontophoresis 4mg /ml Dexamethasone;Traction;Ultrasound;Parrafin;Fluidtherapy;Contrast Bath;DME Instruction;Gait training;Stair training;Functional mobility training;Therapeutic activities;Therapeutic exercise;Balance training;Neuromuscular re-education;Patient/family education;Orthotic Fit/Training;Manual techniques;Manual lymph drainage;Compression bandaging;Scar mobilization;Passive range of motion;Dry needling;Energy conservation;Vasopneumatic Device;Splinting;Taping;Spinal Manipulations;Joint Manipulations    PT Next Visit Plan complete reassessment next session (10th visit and MD on 8/25).    PT Home Exercise Plan 03/31/20 HHPT exercises (ankle pumps, quad sets, glute squeezes, heel slides, abduction in supine, hamstring sets, SAQ, hip abduction in standing, and hamstring curls), STS from elevated  surface  8/18:  hip hiking and SLS           Patient will benefit from skilled therapeutic intervention in order to improve the following deficits and impairments:  Abnormal gait, Decreased activity tolerance, Decreased range of motion, Decreased strength, Decreased balance, Decreased mobility, Difficulty walking, Pain  Visit Diagnosis: Muscle weakness (generalized)  Other symptoms and signs involving the musculoskeletal system  Pain in left hip  Other abnormalities of gait and mobility     Problem List Patient Active Problem List   Diagnosis Date Noted  . 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 Osteoarthritis of  left hip 03/17/2020  . Enterocele 01/02/2018  . OSA on CPAP 12/21/2017  . Obesity (BMI 30-39.9) 12/21/2017  . Depression 08/18/2015  . Positive fecal occult blood test 06/23/2015  . Pelvic pressure in female 01/23/2014  . Rectocele 01/23/2014   Teena Irani, PTA/CLT [phone]  Teena Irani 04/22/2020, 10:51 AM  [place] [address] [place], [place], [address] Phone: [phone]   Fax:  [phone]  Name: DRISHTI PEPPERMAN MRN: [id number] Date of Birth: 1950/09/12

## 2020-04-22 NOTE — Patient Instructions (Signed)
Single Leg - Eyes Closed    While standing on left leg, close eyes and maintain balance. Hold__15__ seconds. Repeat _5___ times per session. Do __2__ sessions per day.  Copyright  VHI. All rights reserved.

## 2020-04-24 ENCOUNTER — Ambulatory Visit (HOSPITAL_COMMUNITY): Payer: Medicare Other | Admitting: Physical Therapy

## 2020-04-24 ENCOUNTER — Telehealth (HOSPITAL_COMMUNITY): Payer: Self-pay

## 2020-04-24 ENCOUNTER — Other Ambulatory Visit: Payer: Self-pay

## 2020-04-24 DIAGNOSIS — M25552 Pain in left hip: Secondary | ICD-10-CM

## 2020-04-24 DIAGNOSIS — R29898 Other symptoms and signs involving the musculoskeletal system: Secondary | ICD-10-CM

## 2020-04-24 DIAGNOSIS — R2689 Other abnormalities of gait and mobility: Secondary | ICD-10-CM | POA: Diagnosis not present

## 2020-04-24 DIAGNOSIS — M6281 Muscle weakness (generalized): Secondary | ICD-10-CM | POA: Diagnosis not present

## 2020-04-24 NOTE — Telephone Encounter (Signed)
pt cancelled appt for 8/25 because she has another MD appt

## 2020-04-24 NOTE — Therapy (Signed)
[place] [address] [place], [place], [address] Phone: [phone]   Fax:  [phone]  Physical Therapy Treatment  Patient Details  Name: Regina Berry MRN: [id number] Date of Birth: [birth year] Referring Provider (PT): Marchia Bond MD   Encounter Date: 04/24/2020   Progress Note Reporting Period 04/01/2019  to 04/25/2019  See note below for Objective Data and Assessment of Progress/Goals.        PT End of Session - 04/24/20 1201    Visit Number 10    Number of Visits 18    Date for PT Re-Evaluation 05/12/20    Authorization Type Medicare    Progress Note Due on Visit 18    PT Start Time 1145    PT Stop Time 1220    PT Time Calculation (min) 35 min    Activity Tolerance Patient tolerated treatment well;Patient limited by fatigue    Behavior During Therapy [facility] for tasks assessed/performed           Past Medical History:  Diagnosis Date  . Anemia   . Anxiety   . Arthritis    Lt hip  . Cancer ([place]) 2020   skin  Rt leg   . Chronic bronchitis ([place])   . COPD (chronic obstructive pulmonary disease) ([place])   . Dyspnea   . GERD (gastroesophageal reflux disease)   . Hypertension   . Hypothyroidism   . Obesity (BMI 30-39.9) 12/21/2017  . OSA on CPAP 12/21/2017   Severe with AHI 29.4/h and oxygen desaturations as low as 75%.  She is now on CPAP at 6 cm water pressure with 2 L oxygen due to nocturnal hypoxemia  . Pelvic pressure in female 01/23/2014  . Positive fecal occult blood test 06/23/2015  . Rectocele 01/23/2014  . 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 Thyroid disease     Past Surgical History:  Procedure Laterality Date  . CESAREAN SECTION    . COLONOSCOPY N/A 09/11/2015   Procedure: COLONOSCOPY;  Surgeon: Rogene Houston, MD;  [facility];  Service: Endoscopy;  Laterality: N/A;  moved to 1/6 @ 1:35 - Ann to notify pt  . EYE SURGERY Bilateral 2021  . LUNG SURGERY Left    infection on outside of lung that  had to be"scaped off".  Tildon Husky REPAIR N/A 01/02/2018   Procedure: POSTERIOR REPAIR (RECTOCELE);  Surgeon: Jonnie Kind, MD;  Location: [facility];  Service: Gynecology;  Laterality: N/A;  . TOTAL HIP ARTHROPLASTY Left 03/17/2020   Procedure: TOTAL HIP ARTHROPLASTY;  Surgeon: Marchia Bond, MD;  [facility];  Service: Orthopedics;  Laterality: Left;    There were no vitals filed for this visit.   Subjective Assessment - 04/24/20 1145    Subjective I really don't have pain when I'm sitting ; sometimes when I walk I don't even feel it.    Limitations Standing;Walking;House hold activities    How long can you sit comfortably? Able to sit as long as she wants to    How long can you stand comfortably? Pt is able to stand for 30 minutes at a time now was 2 minutes    How long can you walk comfortably? Able to walk for 15 minute with her cane at this time was 5 minutes. Inside she walking without her cane 80% of the time.    Patient Stated Goals improve walking    Currently in Pain? 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[employer]      Cognition   Overall Cognitive Status Within Functional Limits for tasks assessed      Observation/Other Assessments   Observations Ambulates with RW    Focus on Therapeutic Outcomes (FOTO)  42% limited;was 45% limited      AROM   Overall AROM Comments limited by pain on LLE    Right Hip Flexion 95    Right Hip  ABduction 28   in supine   Left Hip Flexion 60    Left Hip ABduction 18   in supine     Strength   Right Hip Flexion 5/5   was 4-    Right Hip Extension 3/5    Right Hip ABduction 3+/5    Left Hip Flexion 5/5   was 4-   Left Hip Extension 3-/5    Left Hip ABduction 3+/5    Right Knee Flexion 5/5    Right Knee Extension 5/5    Left Knee Flexion 5/5    Left Knee Extension 5/5    Right Ankle Dorsiflexion 5/5    Left Ankle Dorsiflexion 5/5      Transfers   Comments --      Ambulation/Gait   Ambulation/Gait --    Ambulation/Gait Assistance --    Ambulation Distance (Feet) 339 Feet   was 226   Assistive device None   was a walkder    Gait Pattern --    Gait velocity --    Gait Comments --                         [facility] Adult PT Treatment/Exercise - 04/24/20 0001      Exercises   Exercises Knee/Hip      Knee/Hip Exercises: Standing   SLS with Vectors 2 reps 10" holds and 1 HHA    Other Standing Knee Exercises side stepping 2 RT with red tband       Knee/Hip Exercises: Seated   Sit to Sand 15 reps      Knee/Hip Exercises: Supine   Bridges 20 reps                    PT Short Term Goals - 04/24/20 1159      PT SHORT TERM GOAL #1   Title Patient will be independent with HEP in order to improve functional outcomes.    Time 3    Period Weeks    Status Partially Met    Target Date 04/21/20      PT SHORT TERM GOAL #2   Title Patient will report at least 25% improvement in symptoms for improved quality of life.    Baseline pt states she feels she has improved 50% since beginning therapy    Time 3  Period Weeks    Status Achieved    Target Date 04/21/20             PT Long Term Goals - 04/24/20 1200      PT LONG TERM GOAL #1   Title Patient will report at least 75% improvement in symptoms for improved quality of life.    Time 6    Period Weeks    Status On-going      PT LONG TERM GOAL #2   Title Patient will improve FOTO score by  at least 10 points in order to indicate improved tolerance to activity.    Time 6    Period Weeks    Status On-going      PT LONG TERM GOAL #3   Title Patient will be able to navigate stairs with reciprocal pattern without compensation in order to demonstrate improved LE strength.    Time 6    Period Weeks    Status On-going                 Plan - 04/24/20 1201    Clinical Impression Statement Pt reassessed she has improved in gt, strength and pain but still has limitations in all aspects.  Ms Eppard will continue to benefit from skilled therapy to improve her strength, activity tolerance, gait and balance.    Personal Factors and Comorbidities Comorbidity 2;Age;Behavior Pattern;Time since onset of injury/illness/exacerbation;Fitness    Comorbidities COPD, HTN    Examination-Activity Limitations Bathing;Bed Mobility;Bend;Caring for Others;Carry;Dressing;Hygiene/Grooming;Lift;Locomotion Level;Sleep;Squat;Stairs;Stand;Transfers    Examination-Participation Restrictions Cleaning;Yard Work;Shop;Volunteer    Stability/Clinical Decision Making Stable/Uncomplicated    Rehab Potential Good    PT Frequency 3x / week    PT Duration 6 weeks    PT Treatment/Interventions ADLs/Self Care Home Management;Aquatic Therapy;Biofeedback;Cryotherapy;Electrical Stimulation;Moist Heat;Iontophoresis 80m/ml Dexamethasone;Traction;Ultrasound;Parrafin;Fluidtherapy;Contrast Bath;DME Instruction;Gait training;Stair training;Functional mobility training;Therapeutic activities;Therapeutic exercise;Balance training;Neuromuscular re-education;Patient/family education;Orthotic Fit/Training;Manual techniques;Manual lymph drainage;Compression bandaging;Scar mobilization;Passive range of motion;Dry needling;Energy conservation;Vasopneumatic Device;Splinting;Taping;Spinal Manipulations;Joint Manipulations    PT Next Visit Plan Continue to work on balance and hip strengthening.    PT Home Exercise Plan 03/31/20 HHPT  exercises (ankle pumps, quad sets, glute squeezes, heel slides, abduction in supine, hamstring sets, SAQ, hip abduction in standing, and hamstring curls), STS from elevated surface  8/18:  hip hiking and SLS           Patient will benefit from skilled therapeutic intervention in order to improve the following deficits and impairments:  Abnormal gait, Decreased activity tolerance, Decreased range of motion, Decreased strength, Decreased balance, Decreased mobility, Difficulty walking, Pain  Visit Diagnosis: Muscle weakness (generalized)  Other symptoms and signs involving the musculoskeletal system  Pain in left hip  Other abnormalities of gait and mobility     Problem List Patient Active Problem List   Diagnosis Date Noted  . 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 Osteoarthritis of left hip 03/17/2020  . Enterocele 01/02/2018  . OSA on CPAP 12/21/2017  . Obesity (BMI 30-39.9) 12/21/2017  . Depression 08/18/2015  . Positive fecal occult blood test 06/23/2015  . Pelvic pressure in female 01/23/2014  . Rectocele 01/23/2014    CRayetta Humphrey PT CLT [phone]/20/2021, 12:22 PM  [place] [address]Phone: [phone]  Fax:  [phone] Name: RTACHA MANNIMRN: [id number] of Birth: [birth year]

## 2020-04-27 ENCOUNTER — Telehealth (HOSPITAL_COMMUNITY): Payer: Self-pay | Admitting: Physical Therapy

## 2020-04-27 ENCOUNTER — Encounter (HOSPITAL_COMMUNITY): Payer: Medicare Other | Admitting: Physical Therapy

## 2020-04-27 NOTE — Telephone Encounter (Signed)
pt cancelled appt for today because she thinks her leg is infected and is calling her MD

## 2020-04-28 DIAGNOSIS — M1612 Unilateral primary osteoarthritis, left hip: Secondary | ICD-10-CM | POA: Diagnosis not present

## 2020-04-28 DIAGNOSIS — M25552 Pain in left hip: Secondary | ICD-10-CM | POA: Diagnosis not present

## 2020-04-28 DIAGNOSIS — Z1159 Encounter for screening for other viral diseases: Secondary | ICD-10-CM | POA: Diagnosis not present

## 2020-04-29 ENCOUNTER — Other Ambulatory Visit (HOSPITAL_COMMUNITY): Payer: Self-pay | Admitting: Orthopedic Surgery

## 2020-04-29 ENCOUNTER — Other Ambulatory Visit: Payer: Self-pay | Admitting: Orthopedic Surgery

## 2020-04-29 ENCOUNTER — Encounter (HOSPITAL_COMMUNITY): Payer: Medicare Other

## 2020-04-29 DIAGNOSIS — M25552 Pain in left hip: Secondary | ICD-10-CM

## 2020-04-29 DIAGNOSIS — T8459XA Infection and inflammatory reaction due to other internal joint prosthesis, initial encounter: Secondary | ICD-10-CM | POA: Diagnosis not present

## 2020-05-01 ENCOUNTER — Other Ambulatory Visit (HOSPITAL_COMMUNITY): Payer: Self-pay | Admitting: Orthopedic Surgery

## 2020-05-01 ENCOUNTER — Ambulatory Visit (HOSPITAL_COMMUNITY): Payer: Medicare Other | Admitting: Physical Therapy

## 2020-05-01 ENCOUNTER — Encounter (HOSPITAL_COMMUNITY): Payer: Self-pay | Admitting: Physical Therapy

## 2020-05-01 ENCOUNTER — Other Ambulatory Visit: Payer: Self-pay

## 2020-05-01 ENCOUNTER — Encounter (HOSPITAL_COMMUNITY): Payer: Self-pay | Admitting: Radiology

## 2020-05-01 DIAGNOSIS — M6281 Muscle weakness (generalized): Secondary | ICD-10-CM | POA: Diagnosis not present

## 2020-05-01 DIAGNOSIS — R2689 Other abnormalities of gait and mobility: Secondary | ICD-10-CM | POA: Diagnosis not present

## 2020-05-01 DIAGNOSIS — M25552 Pain in left hip: Secondary | ICD-10-CM | POA: Diagnosis not present

## 2020-05-01 DIAGNOSIS — R29898 Other symptoms and signs involving the musculoskeletal system: Secondary | ICD-10-CM | POA: Diagnosis not present

## 2020-05-01 NOTE — Therapy (Signed)
[place], [place], [address] Phone: [phone]   Fax:  [phone]  Physical Therapy Treatment  Patient Details  Name: Regina Berry MRN: [id number] Date of Birth: 05-06-1951 Referring Provider (PT): Marchia Bond MD   Encounter Date: 05/01/2020   PT End of Session - 05/01/20 1129    Visit Number 11    Number of Visits 18    Date for PT Re-Evaluation 05/12/20    Authorization Type Medicare    Progress Note Due on Visit 18    PT Start Time 1129    PT Stop Time 1212    PT Time Calculation (min) 43 min    Activity Tolerance Patient tolerated treatment well;Patient limited by fatigue    Behavior During Therapy [facility] for tasks assessed/performed           Past Medical History:  Diagnosis Date  . Anemia   . Anxiety   . Arthritis    Lt hip  . Cancer ([place]) 2020   skin  Rt leg   . Chronic bronchitis ([place])   . COPD (chronic obstructive pulmonary disease) ([place])   . Dyspnea   . GERD (gastroesophageal reflux disease)   . Hypertension   . Hypothyroidism   . Obesity (BMI 30-39.9) 12/21/2017  . OSA on CPAP 12/21/2017   Severe with AHI 29.4/h and oxygen desaturations as low as 75%.  She is now on CPAP at 6 cm water pressure with 2 L oxygen due to nocturnal hypoxemia  . Pelvic pressure in female 01/23/2014  . Positive fecal occult blood test 06/23/2015  . Rectocele 01/23/2014  . 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 Thyroid disease     Past Surgical History:  Procedure Laterality Date  . CESAREAN SECTION    . COLONOSCOPY N/A 09/11/2015   Procedure: COLONOSCOPY;  Surgeon: Rogene Houston, MD;  [facility];  Service: Endoscopy;  Laterality: N/A;  moved to 1/6 @ 1:35 - Ann to notify pt  . EYE SURGERY Bilateral 2021  . LUNG SURGERY Left    infection on outside of lung that had to be"scaped off".  Tildon Husky REPAIR N/A 01/02/2018   Procedure: POSTERIOR REPAIR (RECTOCELE);  Surgeon: Jonnie Kind, MD;   Location: [facility] ORS;  Service: Gynecology;  Laterality: N/A;  . TOTAL HIP ARTHROPLASTY Left 03/17/2020   Procedure: TOTAL HIP ARTHROPLASTY;  Surgeon: Marchia Bond, MD;  Location: [facility];  Service: Orthopedics;  Laterality: Left;    There were no vitals filed for this visit.   Subjective Assessment - 05/01/20 1136    Subjective I really don't have pain when I'm sitting ; sometimes when I walk I don't even feel it.States that she has pain about 3-4/10 when she first gets up walking. Reports after getting up and walking she feels better. Reports her incision was infected but she has seen the MD and is waiting to hear back from but overall it is getting a lot better.    Limitations Standing;Walking;House hold activities    How long can you sit comfortably? Able to sit as long as she wants to    How long can you stand comfortably? Pt is able to stand for 30 minutes at a time now was 2 minutes    How long can you walk comfortably? Able to walk for 15 minute with her cane at this time was 5 minutes. Inside she walking without her cane 80% of the time.    Patient  Stated Goals improve walking    Currently in Pain? Yes    Pain Score 3     Pain Location Hip    Pain Orientation Left    Pain Descriptors / Indicators Aching;Tightness              [facility] PT Assessment - 05/01/20 0001      Assessment   Medical Diagnosis L THA    Referring Provider (PT) Marchia Bond MD    Onset Date/Surgical Date 03/17/20                         [facility] Adult PT Treatment/Exercise - 05/01/20 0001      Ambulation/Gait   Stairs Yes    Stair Management Technique One rail Left;Alternating pattern    Number of Stairs 7    Height of Stairs 4    Gait Comments shorter stairs 2x5, then 7" steps x5 (2 railings on descent)      Knee/Hip Exercises: Stretches   Other Knee/Hip Stretches left knee stretch on step x15 10" holds B       Knee/Hip Exercises: Standing   Other Standing Knee Exercises lateral  stepping with  green theraband anchored to wall x6 B     Other Standing Knee Exercises SLS R 6 second, L 12 seconds (no UE support) - with one finger support 3x30" B - focus on hips level      Knee/Hip Exercises: Seated   Sit to Sand 15 reps                  PT Education - 05/01/20 1151    Education Details on progress made, on HEP, on following up with MDs about incision and antibiotics as well as follow up apt.    Person(s) Educated Patient    Methods Explanation    Comprehension Verbalized understanding            PT Short Term Goals - 04/24/20 1159      PT SHORT TERM GOAL #1   Title Patient will be independent with HEP in order to improve functional outcomes.    Time 3    Period Weeks    Status Partially Met    Target Date 04/21/20      PT SHORT TERM GOAL #2   Title Patient will report at least 25% improvement in symptoms for improved quality of life.    Baseline pt states she feels she has improved 50% since beginning therapy    Time 3    Period Weeks    Status Achieved    Target Date 04/21/20             PT Long Term Goals - 04/24/20 1200      PT LONG TERM GOAL #1   Title Patient will report at least 75% improvement in symptoms for improved quality of life.    Time 6    Period Weeks    Status On-going      PT LONG TERM GOAL #2   Title Patient will improve FOTO score by at least 10 points in order to indicate improved tolerance to activity.    Time 6    Period Weeks    Status On-going      PT LONG TERM GOAL #3   Title Patient will be able to navigate stairs with reciprocal pattern without compensation in order to demonstrate improved LE strength.    Time 6    Period  Weeks    Status On-going                 Plan - 05/01/20 1211    Clinical Impression Statement Increased fatigue noted with exercises today. Longer rest breaks to account for increased fatigue. Added one finger support with single leg stance to improve muscle endurance  with this activity. Added 7" steps today and these were tolerated well but difficulty (not pain) noted with decent of activity). Good control noted with lateral stepping with green theraband anchored to wall. Will continue with current POC.    Personal Factors and Comorbidities Comorbidity 2;Age;Behavior Pattern;Time since onset of injury/illness/exacerbation;Fitness    Comorbidities COPD, HTN    Examination-Activity Limitations Bathing;Bed Mobility;Bend;Caring for Others;Carry;Dressing;Hygiene/Grooming;Lift;Locomotion Level;Sleep;Squat;Stairs;Stand;Transfers    Examination-Participation Restrictions Cleaning;Yard Work;Shop;Volunteer    Stability/Clinical Decision Making Stable/Uncomplicated    Rehab Potential Good    PT Frequency 3x / week    PT Duration 6 weeks    PT Treatment/Interventions ADLs/Self Care Home Management;Aquatic Therapy;Biofeedback;Cryotherapy;Electrical Stimulation;Moist Heat;Iontophoresis 32m/ml Dexamethasone;Traction;Ultrasound;Parrafin;Fluidtherapy;Contrast Bath;DME Instruction;Gait training;Stair training;Functional mobility training;Therapeutic activities;Therapeutic exercise;Balance training;Neuromuscular re-education;Patient/family education;Orthotic Fit/Training;Manual techniques;Manual lymph drainage;Compression bandaging;Scar mobilization;Passive range of motion;Dry needling;Energy conservation;Vasopneumatic Device;Splinting;Taping;Spinal Manipulations;Joint Manipulations    PT Next Visit Plan Continue to work on balance and hip strengthening.    PT Home Exercise Plan 03/31/20 HHPT exercises (ankle pumps, quad sets, glute squeezes, heel slides, abduction in supine, hamstring sets, SAQ, hip abduction in standing, and hamstring curls), STS from elevated surface  8/18:  hip hiking and SLS           Patient will benefit from skilled therapeutic intervention in order to improve the following deficits and impairments:  Abnormal gait, Decreased activity tolerance, Decreased  range of motion, Decreased strength, Decreased balance, Decreased mobility, Difficulty walking, Pain  Visit Diagnosis: Muscle weakness (generalized)  Other symptoms and signs involving the musculoskeletal system  Pain in left hip  Other abnormalities of gait and mobility     Problem List Patient Active Problem List   Diagnosis Date Noted  . 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 Osteoarthritis of left hip 03/17/2020  . Enterocele 01/02/2018  . OSA on CPAP 12/21/2017  . Obesity (BMI 30-39.9) 12/21/2017  . Depression 08/18/2015  . Positive fecal occult blood test 06/23/2015  . Pelvic pressure in female 01/23/2014  . Rectocele 01/23/2014    12:12 PM, 05/01/20 MJerene Pitch DPT [facility] [phone]  [place] [address]Phone: [phone]  Fax:  [phone] Name: Regina COTHRANMRN: [id number] of Birth: 1Oct 26, 1952

## 2020-05-01 NOTE — Progress Notes (Signed)
Regina Berry Female, 69 y.o., [birth year] MRN:  [id number] Phone:  [phone] Jerilynn Mages) PCP:  Sharilyn Sites, MD Primary Cvg:  Medicare/Medicare Part A And B Next Appt With Radiology ([facility]-R/F 1) 05/07/2020 at 10:00 AM  RE: STAT:  CT Aspiration Received: Today Arne Cleveland, MD  Garth Bigness D OK   L hip aspiration in fluoro    Note: Hip and shoulder injections and aspirations are typically considered diagnostic rad ( fluoro) cases and do NOT require IR review unless there are unusual issues like needing sedation or overweight for fluoro table   Thanks  DDH       Previous Messages   ----- Message -----  From: Garth Bigness D  Sent: 05/01/2020  2:19 PM EDT  To: Ir Procedure Requests  Subject: FW: STAT:  CT Aspiration            Shick looked at it the other day but no response yet and office keeps calling. Can someone please look at this for me? Thanks  ----- Message -----  From: Garth Bigness D  Sent: 04/29/2020  5:02 PM EDT  To: Ir Procedure Requests  Subject: STAT:  CT Aspiration              Procedure: CT Aspiration   Reason: Left hip pain, S/P Left Hip THR, R/O Infection   History: DG Hip in computer   Provider: Marchia Bond   Provider Contact: [phone]

## 2020-05-04 ENCOUNTER — Encounter (HOSPITAL_COMMUNITY): Payer: Self-pay | Admitting: Physical Therapy

## 2020-05-04 ENCOUNTER — Ambulatory Visit (HOSPITAL_COMMUNITY): Payer: Medicare Other | Admitting: Physical Therapy

## 2020-05-04 DIAGNOSIS — R2689 Other abnormalities of gait and mobility: Secondary | ICD-10-CM | POA: Diagnosis not present

## 2020-05-04 DIAGNOSIS — R29898 Other symptoms and signs involving the musculoskeletal system: Secondary | ICD-10-CM | POA: Diagnosis not present

## 2020-05-04 DIAGNOSIS — M25552 Pain in left hip: Secondary | ICD-10-CM

## 2020-05-04 DIAGNOSIS — M6281 Muscle weakness (generalized): Secondary | ICD-10-CM

## 2020-05-04 NOTE — Therapy (Signed)
[place], [place], [address] Phone: [phone]   Fax:  [phone]  Physical Therapy Treatment  Patient Details  Name: Regina Berry MRN: [id number] Date of Birth: 08-12-1951 Referring Provider (PT): Marchia Bond MD   Encounter Date: 05/04/2020   PT End of Session - 05/04/20 1029    Visit Number 12    Number of Visits 18    Date for PT Re-Evaluation 05/12/20    Authorization Type Medicare    Progress Note Due on Visit 18    PT Start Time 1030    PT Stop Time 1110    PT Time Calculation (min) 40 min    Activity Tolerance Patient tolerated treatment well;Patient limited by fatigue    Behavior During Therapy [facility] for tasks assessed/performed           Past Medical History:  Diagnosis Date  . Anemia   . Anxiety   . Arthritis    Lt hip  . Cancer ([place]) 2020   skin  Rt leg   . Chronic bronchitis ([place])   . COPD (chronic obstructive pulmonary disease) ([place])   . Dyspnea   . GERD (gastroesophageal reflux disease)   . Hypertension   . Hypothyroidism   . Obesity (BMI 30-39.9) 12/21/2017  . OSA on CPAP 12/21/2017   Severe with AHI 29.4/h and oxygen desaturations as low as 75%.  She is now on CPAP at 6 cm water pressure with 2 L oxygen due to nocturnal hypoxemia  . Pelvic pressure in female 01/23/2014  . Positive fecal occult blood test 06/23/2015  . Rectocele 01/23/2014  . 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 Thyroid disease     Past Surgical History:  Procedure Laterality Date  . CESAREAN SECTION    . COLONOSCOPY N/A 09/11/2015   Procedure: COLONOSCOPY;  Surgeon: Rogene Houston, MD;  [facility];  Service: Endoscopy;  Laterality: N/A;  moved to 1/6 @ 1:35 - Ann to notify pt  . EYE SURGERY Bilateral 2021  . LUNG SURGERY Left    infection on outside of lung that had to be"scaped off".  Tildon Husky REPAIR N/A 01/02/2018   Procedure: POSTERIOR REPAIR (RECTOCELE);  Surgeon: Jonnie Kind, MD;   Location: [facility];  Service: Gynecology;  Laterality: N/A;  . TOTAL HIP ARTHROPLASTY Left 03/17/2020   Procedure: TOTAL HIP ARTHROPLASTY;  Surgeon: Marchia Bond, MD;  Location[facility];  Service: Orthopedics;  Laterality: Left;    There were no vitals filed for this visit.   Subjective Assessment - 05/04/20 1028    Subjective She states her calves hurt really bad from stretch last time. Her legs are sore. Her hip is a little sore in the muscles. She is still waiting to hear back about hip infection.    Limitations Standing;Walking;House hold activities    How long can you sit comfortably? Able to sit as long as she wants to    How long can you stand comfortably? Pt is able to stand for 30 minutes at a time now was 2 minutes    How long can you walk comfortably? Able to walk for 15 minute with her cane at this time was 5 minutes. Inside she walking without her cane 80% of the time.    Patient Stated Goals improve walking    Currently in Pain? Yes    Pain Location Hip  [facility] Adult PT Treatment/Exercise - 05/04/20 0001      Ambulation/Gait   Stairs Yes    Stair Management Technique One rail Left;Alternating pattern    Number of Stairs 7    Height of Stairs 4    Gait Comments 4 RT      Knee/Hip Exercises: Standing   Hip Abduction Stengthening;2 sets;15 reps    Lateral Step Up Both;10 reps;Step Height: 4";Hand Hold: 1;Step Height: 6";3 sets    Lateral Step Up Limitations eccentric control    Forward Step Up Both;2 sets;10 reps;Hand Hold: 1;Step Height: 6"    Forward Step Up Limitations with contralateral knee drive    SLS with Vectors 2x5 with 5 second holds    Other Standing Knee Exercises palof in tandem 2x10 bilateral     Other Standing Knee Exercises lateral stepping with  green theraband anchored to wall x6 B       Knee/Hip Exercises: Seated   Sit to Sand 2 sets;15 reps                  PT Education - 05/04/20 1028     Education Details Patient educated on HEP, exercise mechanics    Person(s) Educated Patient    Methods Explanation;Demonstration    Comprehension Verbalized understanding;Returned demonstration            PT Short Term Goals - 04/24/20 1159      PT SHORT TERM GOAL #1   Title Patient will be independent with HEP in order to improve functional outcomes.    Time 3    Period Weeks    Status Partially Met    Target Date 04/21/20      PT SHORT TERM GOAL #2   Title Patient will report at least 25% improvement in symptoms for improved quality of life.    Baseline pt states she feels she has improved 50% since beginning therapy    Time 3    Period Weeks    Status Achieved    Target Date 04/21/20             PT Long Term Goals - 04/24/20 1200      PT LONG TERM GOAL #1   Title Patient will report at least 75% improvement in symptoms for improved quality of life.    Time 6    Period Weeks    Status On-going      PT LONG TERM GOAL #2   Title Patient will improve FOTO score by at least 10 points in order to indicate improved tolerance to activity.    Time 6    Period Weeks    Status On-going      PT LONG TERM GOAL #3   Title Patient will be able to navigate stairs with reciprocal pattern without compensation in order to demonstrate improved LE strength.    Time 6    Period Weeks    Status On-going                 Plan - 05/04/20 1030    Clinical Impression Statement Patient requires UE use with lateral step downs for balance today. Patient able to progress to increased step height today. She demonstrates improve eccentric control with lateral step down but has extreme difficulty with concentric phase secondary to weakness but is able to complete with UE support. Patient requires unilateral finger support with single leg balance exercise and is given cueing for glute contraction while completing. Patient able to complete  increased reps of STS exercise today before  fatigue. Patient more limited by overall endurance than from hip. Patient will continue to benefit from skilled physical therapy in order to reduce impairment and improve function.    Personal Factors and Comorbidities Comorbidity 2;Age;Behavior Pattern;Time since onset of injury/illness/exacerbation;Fitness    Comorbidities COPD, HTN    Examination-Activity Limitations Bathing;Bed Mobility;Bend;Caring for Others;Carry;Dressing;Hygiene/Grooming;Lift;Locomotion Level;Sleep;Squat;Stairs;Stand;Transfers    Examination-Participation Restrictions Cleaning;Yard Work;Shop;Volunteer    Stability/Clinical Decision Making Stable/Uncomplicated    Rehab Potential Good    PT Frequency 3x / week    PT Duration 6 weeks    PT Treatment/Interventions ADLs/Self Care Home Management;Aquatic Therapy;Biofeedback;Cryotherapy;Electrical Stimulation;Moist Heat;Iontophoresis 61m/ml Dexamethasone;Traction;Ultrasound;Parrafin;Fluidtherapy;Contrast Bath;DME Instruction;Gait training;Stair training;Functional mobility training;Therapeutic activities;Therapeutic exercise;Balance training;Neuromuscular re-education;Patient/family education;Orthotic Fit/Training;Manual techniques;Manual lymph drainage;Compression bandaging;Scar mobilization;Passive range of motion;Dry needling;Energy conservation;Vasopneumatic Device;Splinting;Taping;Spinal Manipulations;Joint Manipulations    PT Next Visit Plan Continue to work on balance and hip strengthening.    PT Home Exercise Plan 03/31/20 HHPT exercises (ankle pumps, quad sets, glute squeezes, heel slides, abduction in supine, hamstring sets, SAQ, hip abduction in standing, and hamstring curls), STS from elevated surface  8/18:  hip hiking and SLS           Patient will benefit from skilled therapeutic intervention in order to improve the following deficits and impairments:  Abnormal gait, Decreased activity tolerance, Decreased range of motion, Decreased strength, Decreased balance,  Decreased mobility, Difficulty walking, Pain  Visit Diagnosis: Muscle weakness (generalized)  Other symptoms and signs involving the musculoskeletal system  Pain in left hip  Other abnormalities of gait and mobility     Problem List Patient Active Problem List   Diagnosis Date Noted  . 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 Osteoarthritis of left hip 03/17/2020  . Enterocele 01/02/2018  . OSA on CPAP 12/21/2017  . Obesity (BMI 30-39.9) 12/21/2017  . Depression 08/18/2015  . Positive fecal occult blood test 06/23/2015  . Pelvic pressure in female 01/23/2014  . Rectocele 01/23/2014    11:12 AM, 05/04/20 AMearl LatinPT, DPT Physical Therap[place] [address]Phone: [phone]  Fax:  [phone] Name: Regina MACHNIKMRN: [id number] of Birth: 1February 18, 1952

## 2020-05-06 ENCOUNTER — Other Ambulatory Visit: Payer: Self-pay

## 2020-05-06 ENCOUNTER — Ambulatory Visit (HOSPITAL_COMMUNITY): Payer: Medicare Other | Attending: Orthopedic Surgery | Admitting: Physical Therapy

## 2020-05-06 ENCOUNTER — Encounter (HOSPITAL_COMMUNITY): Payer: Self-pay | Admitting: Physical Therapy

## 2020-05-06 DIAGNOSIS — R2689 Other abnormalities of gait and mobility: Secondary | ICD-10-CM | POA: Diagnosis not present

## 2020-05-06 DIAGNOSIS — R29898 Other symptoms and signs involving the musculoskeletal system: Secondary | ICD-10-CM | POA: Diagnosis not present

## 2020-05-06 DIAGNOSIS — M25552 Pain in left hip: Secondary | ICD-10-CM | POA: Diagnosis not present

## 2020-05-06 DIAGNOSIS — M6281 Muscle weakness (generalized): Secondary | ICD-10-CM

## 2020-05-06 NOTE — Therapy (Signed)
[place], [place], [address] Phone: [phone]   Fax:  [phone]  Physical Therapy Treatment  Patient Details  Name: Regina Berry MRN: [id number] Date of Birth: [birth year] Referring Provider (PT): Marchia Bond MD   Encounter Date: 05/06/2020   PT End of Session - 05/06/20 1040    Visit Number 13    Number of Visits 18    Date for PT Re-Evaluation 05/12/20    Authorization Type Medicare    Progress Note Due on Visit 73    PT Start Time 1040   late arrival and delayed check in   PT Stop Time 1115    PT Time Calculation (min) 35 min    Activity Tolerance Patient tolerated treatment well;Patient limited by fatigue    Behavior During Therapy [facility] for tasks assessed/performed           Past Medical History:  Diagnosis Date  . Anemia   . Anxiety   . Arthritis    Lt hip  . Cancer ([place]) 2020   skin  Rt leg   . Chronic bronchitis ([place])   . COPD (chronic obstructive pulmonary disease) ([place])   . Dyspnea   . GERD (gastroesophageal reflux disease)   . Hypertension   . Hypothyroidism   . Obesity (BMI 30-39.9) 12/21/2017  . OSA on CPAP 12/21/2017   Severe with AHI 29.4/h and oxygen desaturations as low as 75%.  She is now on CPAP at 6 cm water pressure with 2 L oxygen due to nocturnal hypoxemia  . Pelvic pressure in female 01/23/2014  . Positive fecal occult blood test 06/23/2015  . Rectocele 01/23/2014  . 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 Thyroid disease     Past Surgical History:  Procedure Laterality Date  . CESAREAN SECTION    . COLONOSCOPY N/A 09/11/2015   Procedure: COLONOSCOPY;  Surgeon: Rogene Houston, MD;  Location: [facility] ENDO SUITE;  Service: Endoscopy;  Laterality: N/A;  moved to 1/6 @ 1:35 - Ann to notify pt  . EYE SURGERY Bilateral 2021  . LUNG SURGERY Left    infection on outside of lung that had to be"scaped off".  Tildon Husky REPAIR N/A 01/02/2018   Procedure: POSTERIOR REPAIR (RECTOCELE);   Surgeon: Jonnie Kind, MD;  Location: [facility] ORS;  Service: Gynecology;  Laterality: N/A;  . TOTAL HIP ARTHROPLASTY Left 03/17/2020   Procedure: TOTAL HIP ARTHROPLASTY;  Surgeon: Marchia Bond, MD;  Location: [facility] ORS;  Service: Orthopedics;  Laterality: Left;    There were no vitals filed for this visit.   Subjective Assessment - 05/06/20 1039    Subjective Patient is ready to be done next week. Patient notices she still hobbles sometimes.    Limitations Standing;Walking;House hold activities    How long can you sit comfortably? Able to sit as long as she wants to    How long can you stand comfortably? Pt is able to stand for 30 minutes at a time now was 2 minutes    How long can you walk comfortably? Able to walk for 15 minute with her cane at this time was 5 minutes. Inside she walking without her cane 80% of the time.    Patient Stated Goals improve walking    Currently in Pain? No/denies                             [facility] Adult PT Treatment/Exercise -  05/06/20 0001      Knee/Hip Exercises: Standing   Lateral Step Up Both;2 sets;10 reps;Hand Hold: 1;Step Height: 6"    Lateral Step Up Limitations eccentric control    Forward Step Up Both;2 sets;10 reps;Hand Hold: 1;Step Height: 6"    Forward Step Up Limitations with contralateral knee drive    SLS hip hike 2x15    Other Standing Knee Exercises palof in tandem 2x10 bilateral     Other Standing Knee Exercises lateral stepping with  green theraband anchored to wall x6 B                   PT Education - 05/06/20 1040    Education Details Patient educated on HEP, exercise mechanics    Person(s) Educated Patient    Methods Explanation;Demonstration    Comprehension Verbalized understanding;Returned demonstration            PT Short Term Goals - 04/24/20 1159      PT SHORT TERM GOAL #1   Title Patient will be independent with HEP in order to improve functional outcomes.    Time 3    Period Weeks     Status Partially Met    Target Date 04/21/20      PT SHORT TERM GOAL #2   Title Patient will report at least 25% improvement in symptoms for improved quality of life.    Baseline pt states she feels she has improved 50% since beginning therapy    Time 3    Period Weeks    Status Achieved    Target Date 04/21/20             PT Long Term Goals - 04/24/20 1200      PT LONG TERM GOAL #1   Title Patient will report at least 75% improvement in symptoms for improved quality of life.    Time 6    Period Weeks    Status On-going      PT LONG TERM GOAL #2   Title Patient will improve FOTO score by at least 10 points in order to indicate improved tolerance to activity.    Time 6    Period Weeks    Status On-going      PT LONG TERM GOAL #3   Title Patient will be able to navigate stairs with reciprocal pattern without compensation in order to demonstrate improved LE strength.    Time 6    Period Weeks    Status On-going                 Plan - 05/06/20 1040    Clinical Impression Statement Patient requires verbal cueing and demonstration initially for competing hip hike exercise with good carry over. Emphasis today on continued hip abductor and extensor strengthening. She requires cueing throughout session for decreased UE support. Patient demonstrating good eccentric control with lateral step downs and is very fatigued after completing.  Anticipate d/c next week. Patient will continue to benefit from skilled physical therapy in order to reduce impairment and improve function.    Personal Factors and Comorbidities Comorbidity 2;Age;Behavior Pattern;Time since onset of injury/illness/exacerbation;Fitness    Comorbidities COPD, HTN    Examination-Activity Limitations Bathing;Bed Mobility;Bend;Caring for Others;Carry;Dressing;Hygiene/Grooming;Lift;Locomotion Level;Sleep;Squat;Stairs;Stand;Transfers    Examination-Participation Restrictions Cleaning;Yard Work;Shop;Volunteer     Stability/Clinical Decision Making Stable/Uncomplicated    Rehab Potential Good    PT Frequency 3x / week    PT Duration 6 weeks    PT Treatment/Interventions ADLs/Self Care Home Management;Aquatic  Therapy;Biofeedback;Cryotherapy;Electrical Stimulation;Moist Heat;Iontophoresis 53m/ml Dexamethasone;Traction;Ultrasound;Parrafin;Fluidtherapy;Contrast Bath;DME Instruction;Gait training;Stair training;Functional mobility training;Therapeutic activities;Therapeutic exercise;Balance training;Neuromuscular re-education;Patient/family education;Orthotic Fit/Training;Manual techniques;Manual lymph drainage;Compression bandaging;Scar mobilization;Passive range of motion;Dry needling;Energy conservation;Vasopneumatic Device;Splinting;Taping;Spinal Manipulations;Joint Manipulations    PT Next Visit Plan Continue to work on balance and hip strengthening.    PT Home Exercise Plan 03/31/20 HHPT exercises (ankle pumps, quad sets, glute squeezes, heel slides, abduction in supine, hamstring sets, SAQ, hip abduction in standing, and hamstring curls), STS from elevated surface  8/18:  hip hiking and SLS           Patient will benefit from skilled therapeutic intervention in order to improve the following deficits and impairments:  Abnormal gait, Decreased activity tolerance, Decreased range of motion, Decreased strength, Decreased balance, Decreased mobility, Difficulty walking, Pain  Visit Diagnosis: Muscle weakness (generalized)  Other symptoms and signs involving the musculoskeletal system  Pain in left hip  Other abnormalities of gait and mobility     Problem List Patient Active Problem List   Diagnosis Date Noted  . 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 Osteoarthritis of left hip 03/17/2020  . Enterocele 01/02/2018  . OSA on CPAP 12/21/2017  . Obesity (BMI 30-39.9) 12/21/2017  . Depression 08/18/2015  . Positive fecal occult blood test 06/23/2015  . Pelvic pressure in female  01/23/2014  . Rectocele 01/23/2014    11:11 AM, 05/06/20 AMearl LatinPT, DPT [place] [address]Phone: [phone]  Fax:  [phone] Name: RJADI DEYARMINMRN: [id number] of Birth: [birth year]

## 2020-05-07 ENCOUNTER — Ambulatory Visit (HOSPITAL_COMMUNITY)
Admission: RE | Admit: 2020-05-07 | Discharge: 2020-05-07 | Disposition: A | Discharge status: Home / Self Care | Payer: Medicare Other | Source: Ambulatory Visit | Attending: Orthopedic Surgery | Admitting: Orthopedic Surgery

## 2020-05-07 ENCOUNTER — Other Ambulatory Visit (HOSPITAL_COMMUNITY): Payer: Self-pay | Admitting: Orthopedic Surgery

## 2020-05-07 ENCOUNTER — Telehealth (HOSPITAL_COMMUNITY): Payer: Self-pay | Admitting: Physical Therapy

## 2020-05-07 ENCOUNTER — Encounter (HOSPITAL_COMMUNITY): Payer: Self-pay

## 2020-05-07 DIAGNOSIS — M25552 Pain in left hip: Secondary | ICD-10-CM

## 2020-05-07 DIAGNOSIS — T8452XA Infection and inflammatory reaction due to internal left hip prosthesis, initial encounter: Secondary | ICD-10-CM | POA: Diagnosis not present

## 2020-05-07 MED ORDER — LIDOCAINE-EPINEPHRINE 1 %-1:100000 IJ SOLN
INTRAMUSCULAR | Status: AC
  Filled 2020-05-07: qty 1

## 2020-05-07 NOTE — Telephone Encounter (Signed)
pt cancelled appt for 9/3 because she has to take her mother to the MD

## 2020-05-07 NOTE — Procedures (Signed)
Pre Procedure Dx: Post op fluid collection Post Procedural Dx: Same  Technically successful ultrasound-guided aspiration of a total of approximately 80 cc of brown colored serous fluid from the subcutaneous collection subjacent to the incision about the lateral aspect thigh.    Samples were sent to the laboratory as requested by the ordering clinical team.  EBL: None No immediate complications.   Ronny Bacon, MD Pager #: [phone]

## 2020-05-08 ENCOUNTER — Encounter (HOSPITAL_COMMUNITY): Payer: Medicare Other | Admitting: Physical Therapy

## 2020-05-12 ENCOUNTER — Encounter (HOSPITAL_COMMUNITY): Payer: Medicare Other | Admitting: Physical Therapy

## 2020-05-12 LAB — AEROBIC/ANAEROBIC CULTURE W GRAM STAIN (SURGICAL/DEEP WOUND): Culture: NO GROWTH

## 2020-05-13 ENCOUNTER — Inpatient Hospital Stay (HOSPITAL_COMMUNITY)
Admission: AD | Admit: 2020-05-13 | Discharge: 2020-05-17 | DRG: 464 | Disposition: A | Discharge status: Home / Self Care | Payer: Medicare Other | Source: Ambulatory Visit | Attending: Orthopedic Surgery | Admitting: Orthopedic Surgery

## 2020-05-13 ENCOUNTER — Inpatient Hospital Stay (HOSPITAL_COMMUNITY): Payer: Medicare Other

## 2020-05-13 ENCOUNTER — Encounter (HOSPITAL_COMMUNITY)
Admission: AD | Disposition: A | Discharge status: Home / Self Care | Payer: Self-pay | Source: Ambulatory Visit | Attending: Orthopedic Surgery

## 2020-05-13 ENCOUNTER — Ambulatory Visit (HOSPITAL_COMMUNITY): Payer: Medicare Other | Admitting: Certified Registered Nurse Anesthetist

## 2020-05-13 ENCOUNTER — Encounter (HOSPITAL_COMMUNITY): Payer: Self-pay | Admitting: Orthopedic Surgery

## 2020-05-13 ENCOUNTER — Inpatient Hospital Stay: Payer: Self-pay

## 2020-05-13 DIAGNOSIS — T8149XA Infection following a procedure, other surgical site, initial encounter: Secondary | ICD-10-CM | POA: Diagnosis not present

## 2020-05-13 DIAGNOSIS — Z8249 Family history of ischemic heart disease and other diseases of the circulatory system: Secondary | ICD-10-CM

## 2020-05-13 DIAGNOSIS — M349 Systemic sclerosis, unspecified: Secondary | ICD-10-CM | POA: Diagnosis present

## 2020-05-13 DIAGNOSIS — Y792 Prosthetic and other implants, materials and accessory orthopedic devices associated with adverse incidents: Secondary | ICD-10-CM | POA: Diagnosis present

## 2020-05-13 DIAGNOSIS — D62 Acute posthemorrhagic anemia: Secondary | ICD-10-CM | POA: Diagnosis not present

## 2020-05-13 DIAGNOSIS — G4733 Obstructive sleep apnea (adult) (pediatric): Secondary | ICD-10-CM | POA: Diagnosis not present

## 2020-05-13 DIAGNOSIS — T8452XA Infection and inflammatory reaction due to internal left hip prosthesis, initial encounter: Secondary | ICD-10-CM

## 2020-05-13 DIAGNOSIS — Z7982 Long term (current) use of aspirin: Secondary | ICD-10-CM

## 2020-05-13 DIAGNOSIS — I10 Essential (primary) hypertension: Secondary | ICD-10-CM | POA: Diagnosis present

## 2020-05-13 DIAGNOSIS — Z471 Aftercare following joint replacement surgery: Secondary | ICD-10-CM | POA: Diagnosis not present

## 2020-05-13 DIAGNOSIS — J449 Chronic obstructive pulmonary disease, unspecified: Secondary | ICD-10-CM | POA: Diagnosis present

## 2020-05-13 DIAGNOSIS — L02416 Cutaneous abscess of left lower limb: Secondary | ICD-10-CM | POA: Diagnosis present

## 2020-05-13 DIAGNOSIS — R700 Elevated erythrocyte sedimentation rate: Secondary | ICD-10-CM | POA: Diagnosis not present

## 2020-05-13 DIAGNOSIS — T8459XA Infection and inflammatory reaction due to other internal joint prosthesis, initial encounter: Secondary | ICD-10-CM | POA: Diagnosis not present

## 2020-05-13 DIAGNOSIS — L03116 Cellulitis of left lower limb: Secondary | ICD-10-CM | POA: Diagnosis present

## 2020-05-13 DIAGNOSIS — Z96642 Presence of left artificial hip joint: Secondary | ICD-10-CM | POA: Diagnosis not present

## 2020-05-13 DIAGNOSIS — E039 Hypothyroidism, unspecified: Secondary | ICD-10-CM | POA: Diagnosis not present

## 2020-05-13 DIAGNOSIS — F419 Anxiety disorder, unspecified: Secondary | ICD-10-CM | POA: Diagnosis not present

## 2020-05-13 DIAGNOSIS — Y831 Surgical operation with implant of artificial internal device as the cause of abnormal reaction of the patient, or of later complication, without mention of misadventure at the time of the procedure: Secondary | ICD-10-CM | POA: Diagnosis present

## 2020-05-13 DIAGNOSIS — Z20822 Contact with and (suspected) exposure to covid-19: Secondary | ICD-10-CM | POA: Diagnosis not present

## 2020-05-13 DIAGNOSIS — K219 Gastro-esophageal reflux disease without esophagitis: Secondary | ICD-10-CM | POA: Diagnosis not present

## 2020-05-13 DIAGNOSIS — F17210 Nicotine dependence, cigarettes, uncomplicated: Secondary | ICD-10-CM | POA: Diagnosis present

## 2020-05-13 DIAGNOSIS — M1990 Unspecified osteoarthritis, unspecified site: Secondary | ICD-10-CM | POA: Diagnosis present

## 2020-05-13 DIAGNOSIS — E669 Obesity, unspecified: Secondary | ICD-10-CM | POA: Diagnosis present

## 2020-05-13 DIAGNOSIS — Z825 Family history of asthma and other chronic lower respiratory diseases: Secondary | ICD-10-CM

## 2020-05-13 DIAGNOSIS — M25552 Pain in left hip: Secondary | ICD-10-CM | POA: Diagnosis not present

## 2020-05-13 DIAGNOSIS — A499 Bacterial infection, unspecified: Secondary | ICD-10-CM | POA: Diagnosis not present

## 2020-05-13 DIAGNOSIS — Z885 Allergy status to narcotic agent status: Secondary | ICD-10-CM | POA: Diagnosis not present

## 2020-05-13 DIAGNOSIS — Z79899 Other long term (current) drug therapy: Secondary | ICD-10-CM

## 2020-05-13 DIAGNOSIS — Z79890 Hormone replacement therapy (postmenopausal): Secondary | ICD-10-CM

## 2020-05-13 DIAGNOSIS — B9620 Unspecified Escherichia coli [E. coli] as the cause of diseases classified elsewhere: Secondary | ICD-10-CM | POA: Diagnosis not present

## 2020-05-13 DIAGNOSIS — T8141XA Infection following a procedure, superficial incisional surgical site, initial encounter: Secondary | ICD-10-CM | POA: Diagnosis present

## 2020-05-13 DIAGNOSIS — Z6836 Body mass index (BMI) 36.0-36.9, adult: Secondary | ICD-10-CM | POA: Diagnosis not present

## 2020-05-13 DIAGNOSIS — T8452XD Infection and inflammatory reaction due to internal left hip prosthesis, subsequent encounter: Secondary | ICD-10-CM

## 2020-05-13 HISTORY — PX: TOTAL HIP ARTHROPLASTY: SHX124

## 2020-05-13 LAB — CBC WITH DIFFERENTIAL/PLATELET
Abs Immature Granulocytes: 0.06 10*3/uL (ref 0.00–0.07)
Basophils Absolute: 0.1 10*3/uL (ref 0.0–0.1)
Basophils Relative: 0 %
Eosinophils Absolute: 0 10*3/uL (ref 0.0–0.5)
Eosinophils Relative: 0 %
HCT: 34.8 % — ABNORMAL LOW (ref 36.0–46.0)
Hemoglobin: 11 g/dL — ABNORMAL LOW (ref 12.0–15.0)
Immature Granulocytes: 0 %
Lymphocytes Relative: 13 %
Lymphs Abs: 1.8 10*3/uL (ref 0.7–4.0)
MCH: 27.5 pg (ref 26.0–34.0)
MCHC: 31.6 g/dL (ref 30.0–36.0)
MCV: 87 fL (ref 80.0–100.0)
Monocytes Absolute: 2 10*3/uL — ABNORMAL HIGH (ref 0.1–1.0)
Monocytes Relative: 14 %
Neutro Abs: 10.4 10*3/uL — ABNORMAL HIGH (ref 1.7–7.7)
Neutrophils Relative %: 73 %
Platelets: 301 10*3/uL (ref 150–400)
RBC: 4 MIL/uL (ref 3.87–5.11)
RDW: 14.6 % (ref 11.5–15.5)
WBC: 14.3 10*3/uL — ABNORMAL HIGH (ref 4.0–10.5)
nRBC: 0 % (ref 0.0–0.2)

## 2020-05-13 LAB — SARS CORONAVIRUS 2 BY RT PCR (HOSPITAL ORDER, PERFORMED IN ~~LOC~~ HOSPITAL LAB): SARS Coronavirus 2: NEGATIVE

## 2020-05-13 SURGERY — ARTHROPLASTY, HIP, TOTAL,POSTERIOR APPROACH
Anesthesia: General | Site: Hip | Laterality: Left

## 2020-05-13 MED ORDER — CHLORHEXIDINE GLUCONATE 4 % EX LIQD: 60.0000 mL | Freq: Once | CUTANEOUS | Status: DC

## 2020-05-13 MED ORDER — HYDROCODONE-ACETAMINOPHEN 5-325 MG PO TABS
1.0000 | ORAL_TABLET | ORAL | Status: DC | PRN
  Administered 2020-05-14: 1 via ORAL
  Administered 2020-05-15: 2 via ORAL
  Administered 2020-05-16: 1 via ORAL
  Filled 2020-05-13: qty 1
  Filled 2020-05-13: qty 2
  Filled 2020-05-13: qty 1

## 2020-05-13 MED ORDER — CHLORHEXIDINE GLUCONATE CLOTH 2 % EX PADS
6.0000 | MEDICATED_PAD | Freq: Every day | CUTANEOUS | Status: DC
  Administered 2020-05-14 – 2020-05-15 (×2): 6 via TOPICAL

## 2020-05-13 MED ORDER — ACETAMINOPHEN 500 MG PO TABS
1000.0000 mg | ORAL_TABLET | Freq: Once | ORAL | Status: AC
  Administered 2020-05-13: 1000 mg via ORAL
  Filled 2020-05-13: qty 2

## 2020-05-13 MED ORDER — PHENOL 1.4 % MT LIQD: 1.0000 | OROMUCOSAL | Status: DC | PRN

## 2020-05-13 MED ORDER — PROPOFOL 10 MG/ML IV BOLUS
INTRAVENOUS | Status: AC
  Filled 2020-05-13: qty 20

## 2020-05-13 MED ORDER — IRRISEPT - 450ML BOTTLE WITH 0.05% CHG IN STERILE WATER, USP 99.95% OPTIME
TOPICAL | Status: DC | PRN
  Administered 2020-05-13: 450 mL

## 2020-05-13 MED ORDER — ENOXAPARIN SODIUM 40 MG/0.4ML ~~LOC~~ SOLN
40.0000 mg | SUBCUTANEOUS | Status: DC
  Administered 2020-05-14 – 2020-05-17 (×4): 40 mg via SUBCUTANEOUS
  Filled 2020-05-13 (×4): qty 0.4

## 2020-05-13 MED ORDER — LORATADINE 10 MG PO TABS
10.0000 mg | ORAL_TABLET | Freq: Every day | ORAL | Status: DC
  Administered 2020-05-13 – 2020-05-16 (×4): 10 mg via ORAL
  Filled 2020-05-13 (×4): qty 1

## 2020-05-13 MED ORDER — FENTANYL CITRATE (PF) 100 MCG/2ML IJ SOLN
INTRAMUSCULAR | Status: AC
  Filled 2020-05-13: qty 2

## 2020-05-13 MED ORDER — ONDANSETRON HCL 4 MG/2ML IJ SOLN: 4.0000 mg | Freq: Four times a day (QID) | INTRAMUSCULAR | Status: DC | PRN

## 2020-05-13 MED ORDER — METHOCARBAMOL 500 MG IVPB - SIMPLE MED
INTRAVENOUS | Status: AC
  Administered 2020-05-13: 500 mg via INTRAVENOUS
  Filled 2020-05-13: qty 50

## 2020-05-13 MED ORDER — FENTANYL CITRATE (PF) 250 MCG/5ML IJ SOLN
INTRAMUSCULAR | Status: AC
  Filled 2020-05-13: qty 5

## 2020-05-13 MED ORDER — SUGAMMADEX SODIUM 200 MG/2ML IV SOLN
INTRAVENOUS | Status: DC | PRN
  Administered 2020-05-13: 200 mg via INTRAVENOUS

## 2020-05-13 MED ORDER — HYDROCODONE-ACETAMINOPHEN 7.5-325 MG PO TABS: 1.0000 | ORAL_TABLET | ORAL | Status: DC | PRN

## 2020-05-13 MED ORDER — POTASSIUM CHLORIDE CRYS ER 20 MEQ PO TBCR
20.0000 meq | EXTENDED_RELEASE_TABLET | Freq: Every day | ORAL | Status: DC
  Administered 2020-05-13 – 2020-05-17 (×5): 20 meq via ORAL
  Filled 2020-05-13 (×5): qty 1

## 2020-05-13 MED ORDER — AMISULPRIDE (ANTIEMETIC) 5 MG/2ML IV SOLN: 10.0000 mg | Freq: Once | INTRAVENOUS | Status: DC | PRN

## 2020-05-13 MED ORDER — VANCOMYCIN HCL 1000 MG IV SOLR
INTRAVENOUS | Status: AC
  Filled 2020-05-13: qty 2000

## 2020-05-13 MED ORDER — ROCURONIUM BROMIDE 10 MG/ML (PF) SYRINGE
PREFILLED_SYRINGE | INTRAVENOUS | Status: DC | PRN
  Administered 2020-05-13: 10 mg via INTRAVENOUS
  Administered 2020-05-13: 40 mg via INTRAVENOUS

## 2020-05-13 MED ORDER — POVIDONE-IODINE 10 % EX SWAB
2.0000 "application " | Freq: Once | CUTANEOUS | Status: AC
  Administered 2020-05-13: 2 via TOPICAL

## 2020-05-13 MED ORDER — ACETAMINOPHEN 325 MG PO TABS: 325.0000 mg | ORAL_TABLET | Freq: Four times a day (QID) | ORAL | Status: DC | PRN

## 2020-05-13 MED ORDER — POTASSIUM CHLORIDE IN NACL 20-0.45 MEQ/L-% IV SOLN
INTRAVENOUS | Status: DC
  Filled 2020-05-13 (×3): qty 1000

## 2020-05-13 MED ORDER — DIPHENHYDRAMINE HCL 12.5 MG/5ML PO ELIX: 12.5000 mg | ORAL_SOLUTION | ORAL | Status: DC | PRN

## 2020-05-13 MED ORDER — PROPOFOL 10 MG/ML IV BOLUS
INTRAVENOUS | Status: DC | PRN
  Administered 2020-05-13: 150 mg via INTRAVENOUS

## 2020-05-13 MED ORDER — DEXAMETHASONE SODIUM PHOSPHATE 10 MG/ML IJ SOLN
INTRAMUSCULAR | Status: DC | PRN
  Administered 2020-05-13: 8 mg via INTRAVENOUS

## 2020-05-13 MED ORDER — PIPERACILLIN-TAZOBACTAM 3.375 G IVPB 30 MIN
3.3750 g | Freq: Once | INTRAVENOUS | Status: AC
  Administered 2020-05-13: 3.375 g via INTRAVENOUS
  Filled 2020-05-13: qty 50

## 2020-05-13 MED ORDER — FENTANYL CITRATE (PF) 100 MCG/2ML IJ SOLN
INTRAMUSCULAR | Status: DC | PRN
Start: 2020-05-13 — End: 2020-05-13

## 2020-05-13 MED ORDER — FLUTICASONE PROPIONATE 50 MCG/ACT NA SUSP
2.0000 | Freq: Every day | NASAL | Status: DC | PRN
  Filled 2020-05-13: qty 16

## 2020-05-13 MED ORDER — ALUM & MAG HYDROXIDE-SIMETH 200-200-20 MG/5ML PO SUSP: 30.0000 mL | ORAL | Status: DC | PRN

## 2020-05-13 MED ORDER — VANCOMYCIN HCL 1000 MG IV SOLR
INTRAVENOUS | Status: DC | PRN
  Administered 2020-05-13: 2000 mg

## 2020-05-13 MED ORDER — FENTANYL CITRATE (PF) 100 MCG/2ML IJ SOLN
INTRAMUSCULAR | Status: DC | PRN
Start: 2020-05-13 — End: 2020-05-13
  Administered 2020-05-13: 50 ug via INTRAVENOUS
  Administered 2020-05-13: 100 ug via INTRAVENOUS
  Administered 2020-05-13 (×2): 50 ug via INTRAVENOUS

## 2020-05-13 MED ORDER — PHENYLEPHRINE 40 MCG/ML (10ML) SYRINGE FOR IV PUSH (FOR BLOOD PRESSURE SUPPORT)
PREFILLED_SYRINGE | INTRAVENOUS | Status: DC | PRN
  Administered 2020-05-13 (×2): 80 ug via INTRAVENOUS

## 2020-05-13 MED ORDER — ONDANSETRON HCL 4 MG PO TABS: 4.0000 mg | ORAL_TABLET | Freq: Four times a day (QID) | ORAL | Status: DC | PRN

## 2020-05-13 MED ORDER — FUROSEMIDE 20 MG PO TABS: 20.0000 mg | ORAL_TABLET | Freq: Every day | ORAL | Status: DC | PRN

## 2020-05-13 MED ORDER — METOCLOPRAMIDE HCL 5 MG/ML IJ SOLN: 5.0000 mg | Freq: Three times a day (TID) | INTRAMUSCULAR | Status: DC | PRN

## 2020-05-13 MED ORDER — METHOCARBAMOL 500 MG PO TABS: 500.0000 mg | ORAL_TABLET | Freq: Four times a day (QID) | ORAL | Status: DC | PRN

## 2020-05-13 MED ORDER — CITALOPRAM HYDROBROMIDE 20 MG PO TABS
40.0000 mg | ORAL_TABLET | Freq: Every day | ORAL | Status: DC
  Administered 2020-05-13 – 2020-05-16 (×4): 40 mg via ORAL
  Filled 2020-05-13 (×4): qty 2

## 2020-05-13 MED ORDER — HYDROCHLOROTHIAZIDE 25 MG PO TABS
25.0000 mg | ORAL_TABLET | Freq: Every day | ORAL | Status: DC
  Administered 2020-05-14 – 2020-05-17 (×4): 25 mg via ORAL
  Filled 2020-05-13 (×4): qty 1

## 2020-05-13 MED ORDER — STERILE WATER FOR IRRIGATION IR SOLN
Status: DC | PRN
  Administered 2020-05-13: 2000 mL

## 2020-05-13 MED ORDER — PIPERACILLIN-TAZOBACTAM 3.375 G IVPB
INTRAVENOUS | Status: AC
  Filled 2020-05-13: qty 50

## 2020-05-13 MED ORDER — DOCUSATE SODIUM 100 MG PO CAPS
100.0000 mg | ORAL_CAPSULE | Freq: Two times a day (BID) | ORAL | Status: DC
  Administered 2020-05-13 – 2020-05-17 (×8): 100 mg via ORAL
  Filled 2020-05-13 (×8): qty 1

## 2020-05-13 MED ORDER — BUPIVACAINE HCL (PF) 0.25 % IJ SOLN
INTRAMUSCULAR | Status: AC
  Filled 2020-05-13: qty 30

## 2020-05-13 MED ORDER — BISACODYL 10 MG RE SUPP: 10.0000 mg | Freq: Every day | RECTAL | Status: DC | PRN

## 2020-05-13 MED ORDER — LEVOTHYROXINE SODIUM 88 MCG PO TABS
88.0000 ug | ORAL_TABLET | Freq: Every day | ORAL | Status: DC
  Administered 2020-05-14 – 2020-05-17 (×3): 88 ug via ORAL
  Filled 2020-05-13 (×3): qty 1

## 2020-05-13 MED ORDER — ACETAMINOPHEN 500 MG PO TABS
500.0000 mg | ORAL_TABLET | Freq: Four times a day (QID) | ORAL | Status: AC
  Administered 2020-05-13 – 2020-05-14 (×4): 500 mg via ORAL
  Filled 2020-05-13 (×4): qty 1

## 2020-05-13 MED ORDER — LOSARTAN POTASSIUM 50 MG PO TABS
100.0000 mg | ORAL_TABLET | Freq: Every day | ORAL | Status: DC
  Administered 2020-05-15 – 2020-05-17 (×3): 100 mg via ORAL
  Filled 2020-05-13 (×4): qty 2

## 2020-05-13 MED ORDER — VITAMIN B-12 1000 MCG PO TABS
1000.0000 ug | ORAL_TABLET | Freq: Every day | ORAL | Status: DC
  Administered 2020-05-13 – 2020-05-17 (×5): 1000 ug via ORAL
  Filled 2020-05-13 (×5): qty 1

## 2020-05-13 MED ORDER — PIPERACILLIN-TAZOBACTAM 3.375 G IVPB
3.3750 g | Freq: Three times a day (TID) | INTRAVENOUS | Status: DC
  Administered 2020-05-14 – 2020-05-15 (×5): 3.375 g via INTRAVENOUS
  Filled 2020-05-13 (×6): qty 50

## 2020-05-13 MED ORDER — BUPROPION HCL ER (XL) 300 MG PO TB24
300.0000 mg | ORAL_TABLET | Freq: Every day | ORAL | Status: DC
  Administered 2020-05-13 – 2020-05-17 (×5): 300 mg via ORAL
  Filled 2020-05-13 (×5): qty 1

## 2020-05-13 MED ORDER — FENTANYL CITRATE (PF) 100 MCG/2ML IJ SOLN
25.0000 ug | INTRAMUSCULAR | Status: DC | PRN
  Administered 2020-05-13: 50 ug via INTRAVENOUS

## 2020-05-13 MED ORDER — VANCOMYCIN HCL 750 MG/150ML IV SOLN
750.0000 mg | Freq: Once | INTRAVENOUS | Status: AC
  Administered 2020-05-13: 750 mg via INTRAVENOUS
  Filled 2020-05-13: qty 150

## 2020-05-13 MED ORDER — 0.9 % SODIUM CHLORIDE (POUR BTL) OPTIME
TOPICAL | Status: DC | PRN
  Administered 2020-05-13: 1000 mL

## 2020-05-13 MED ORDER — MENTHOL 3 MG MT LOZG: 1.0000 | LOZENGE | OROMUCOSAL | Status: DC | PRN

## 2020-05-13 MED ORDER — ONDANSETRON HCL 4 MG/2ML IJ SOLN
INTRAMUSCULAR | Status: DC | PRN
  Administered 2020-05-13: 4 mg via INTRAVENOUS

## 2020-05-13 MED ORDER — SODIUM CHLORIDE 0.9 % IR SOLN
Status: DC | PRN
  Administered 2020-05-13: 18000 mL

## 2020-05-13 MED ORDER — VANCOMYCIN HCL IN DEXTROSE 1-5 GM/200ML-% IV SOLN
INTRAVENOUS | Status: AC
  Filled 2020-05-13: qty 200

## 2020-05-13 MED ORDER — VANCOMYCIN HCL 1000 MG IV SOLR
INTRAVENOUS | Status: DC | PRN
  Administered 2020-05-13: 1000 mg via INTRAVENOUS

## 2020-05-13 MED ORDER — MAGNESIUM CITRATE PO SOLN: 1.0000 | Freq: Once | ORAL | Status: DC | PRN

## 2020-05-13 MED ORDER — SODIUM CHLORIDE 0.9% FLUSH: 10.0000 mL | INTRAVENOUS | Status: DC | PRN

## 2020-05-13 MED ORDER — LIDOCAINE 2% (20 MG/ML) 5 ML SYRINGE
INTRAMUSCULAR | Status: AC
  Filled 2020-05-13: qty 5

## 2020-05-13 MED ORDER — LOSARTAN POTASSIUM-HCTZ 100-25 MG PO TABS: 1.0000 | ORAL_TABLET | Freq: Every day | ORAL | Status: DC

## 2020-05-13 MED ORDER — KETOROLAC TROMETHAMINE 30 MG/ML IJ SOLN
INTRAMUSCULAR | Status: AC
  Filled 2020-05-13: qty 1

## 2020-05-13 MED ORDER — LACTATED RINGERS IV SOLN: INTRAVENOUS | Status: DC

## 2020-05-13 MED ORDER — MORPHINE SULFATE (PF) 2 MG/ML IV SOLN: 0.5000 mg | INTRAVENOUS | Status: DC | PRN

## 2020-05-13 MED ORDER — LIDOCAINE 2% (20 MG/ML) 5 ML SYRINGE
INTRAMUSCULAR | Status: DC | PRN
  Administered 2020-05-13: 60 mg via INTRAVENOUS

## 2020-05-13 MED ORDER — ROCURONIUM BROMIDE 10 MG/ML (PF) SYRINGE
PREFILLED_SYRINGE | INTRAVENOUS | Status: AC
  Filled 2020-05-13: qty 10

## 2020-05-13 MED ORDER — ZOLPIDEM TARTRATE 5 MG PO TABS: 5.0000 mg | ORAL_TABLET | Freq: Every evening | ORAL | Status: DC | PRN

## 2020-05-13 MED ORDER — POLYETHYLENE GLYCOL 3350 17 G PO PACK: 17.0000 g | PACK | Freq: Every day | ORAL | Status: DC | PRN

## 2020-05-13 MED ORDER — METOCLOPRAMIDE HCL 5 MG PO TABS: 5.0000 mg | ORAL_TABLET | Freq: Three times a day (TID) | ORAL | Status: DC | PRN

## 2020-05-13 MED ORDER — METHOCARBAMOL 500 MG IVPB - SIMPLE MED
500.0000 mg | Freq: Four times a day (QID) | INTRAVENOUS | Status: DC | PRN
  Filled 2020-05-13: qty 50

## 2020-05-13 SURGICAL SUPPLY — 54 items
BIT DRILL 2.0X128 (BIT) ×2 IMPLANT
BLADE SAW SAG 73X25 THK (BLADE) ×1
BLADE SAW SGTL 73X25 THK (BLADE) ×1 IMPLANT
CLSR STERI-STRIP ANTIMIC 1/2X4 (GAUZE/BANDAGES/DRESSINGS) ×4 IMPLANT
COVER SURGICAL LIGHT HANDLE (MISCELLANEOUS) ×2 IMPLANT
COVER WAND RF STERILE (DRAPES) IMPLANT
DRAPE INCISE IOBAN 66X45 STRL (DRAPES) ×2 IMPLANT
DRAPE ORTHO SPLIT 77X108 STRL (DRAPES) ×4
DRAPE POUCH INSTRU U-SHP 10X18 (DRAPES) ×2 IMPLANT
DRAPE SHEET LG 3/4 BI-LAMINATE (DRAPES) ×2 IMPLANT
DRAPE SURG 17X11 SM STRL (DRAPES) ×2 IMPLANT
DRAPE SURG ORHT 6 SPLT 77X108 (DRAPES) ×2 IMPLANT
DRAPE U-SHAPE 47X51 STRL (DRAPES) ×2 IMPLANT
DRSG MEPILEX BORDER 4X8 (GAUZE/BANDAGES/DRESSINGS) ×2 IMPLANT
DURAPREP 26ML APPLICATOR (WOUND CARE) ×4 IMPLANT
ELECT BLADE TIP CTD 4 INCH (ELECTRODE) ×2 IMPLANT
ELECT REM PT RETURN 15FT ADLT (MISCELLANEOUS) ×2 IMPLANT
FACESHIELD WRAPAROUND (MASK) ×2 IMPLANT
FACESHIELD WRAPAROUND OR TEAM (MASK) ×1 IMPLANT
GLOVE BIO SURGEON STRL SZ7 (GLOVE) ×2 IMPLANT
GLOVE BIO SURGEON STRL SZ7.5 (GLOVE) ×2 IMPLANT
GLOVE BIOGEL PI IND STRL 7.0 (GLOVE) ×1 IMPLANT
GLOVE BIOGEL PI IND STRL 8 (GLOVE) ×1 IMPLANT
GLOVE BIOGEL PI INDICATOR 7.0 (GLOVE) ×1
GLOVE BIOGEL PI INDICATOR 8 (GLOVE) ×1
GOWN STRL REUS W/TWL LRG LVL3 (GOWN DISPOSABLE) ×4 IMPLANT
HOOD PEEL AWAY FLYTE STAYCOOL (MISCELLANEOUS) ×6 IMPLANT
KIT BASIN OR (CUSTOM PROCEDURE TRAY) ×2 IMPLANT
KIT TURNOVER KIT A (KITS) ×2 IMPLANT
LINER NEUTRAL 52X36MM PLUS 4 (Liner) ×1 IMPLANT
MANIFOLD NEPTUNE II (INSTRUMENTS) ×2 IMPLANT
NDL SAFETY ECLIPSE 18X1.5 (NEEDLE) ×2 IMPLANT
NEEDLE HYPO 18GX1.5 SHARP (NEEDLE) ×4
NS IRRIG 1000ML POUR BTL (IV SOLUTION) ×2 IMPLANT
PACK TOTAL JOINT (CUSTOM PROCEDURE TRAY) ×2 IMPLANT
PENCIL SMOKE EVACUATOR (MISCELLANEOUS) IMPLANT
PROTECTOR NERVE ULNAR (MISCELLANEOUS) ×2 IMPLANT
RETRIEVER SUT HEWSON (MISCELLANEOUS) ×2 IMPLANT
SUCTION FRAZIER HANDLE 10FR (MISCELLANEOUS) ×2
SUCTION FRAZIER HANDLE 12FR (TUBING) ×2
SUCTION TUBE FRAZIER 10FR DISP (MISCELLANEOUS) IMPLANT
SUCTION TUBE FRAZIER 12FR DISP (TUBING) ×1 IMPLANT
SUT FIBERWIRE #2 38 REV NDL BL (SUTURE) ×6
SUT VIC AB 0 CT1 36 (SUTURE) ×2 IMPLANT
SUT VIC AB 1 CT1 36 (SUTURE) ×4 IMPLANT
SUT VIC AB 2-0 CT1 27 (SUTURE) ×4
SUT VIC AB 2-0 CT1 TAPERPNT 27 (SUTURE) ×2 IMPLANT
SUT VIC AB 3-0 SH 8-18 (SUTURE) ×2 IMPLANT
SUTURE FIBERWR#2 38 REV NDL BL (SUTURE) ×3 IMPLANT
SYR CONTROL 10ML LL (SYRINGE) ×4 IMPLANT
TOWEL OR 17X26 10 PK STRL BLUE (TOWEL DISPOSABLE) ×2 IMPLANT
TRAY FOLEY MTR SLVR 16FR STAT (SET/KITS/TRAYS/PACK) ×2 IMPLANT
WATER STERILE IRR 1000ML POUR (IV SOLUTION) ×4 IMPLANT
YANKAUER SUCT BULB TIP 10FT TU (MISCELLANEOUS) ×2 IMPLANT

## 2020-05-13 NOTE — Progress Notes (Addendum)
Pharmacy Antibiotic Note  Regina Berry is a 69 y.o. female admitted on 05/13/2020 with infected left prosthetic hip joint.  Pharmacy has been consulted for vancomycin and Zosyn dosing. Patient received a dose of vanc and Zosyn in OR around 1830 (vanc not charted in [facility] history, but charted as given in actual [facility]). Patient does not have a current SCr in Epic  Plan:  Zosyn every 8 hrs by 4-hr infusion  Will give vanc 750 mg IV x 1 to complete ~1750 mg loading dose  Pharmacy will f/u SCr tomorrow AM and dose vanc as appropriate   Height: 5\' 3"  (160 cm) Weight: 93 kg (205 lb) IBW/kg (Calculated) : 52.4  Temp (24hrs), Avg:98.2 F (36.8 C), Min:98 F (36.7 C), Max:98.7 F (37.1 C)  Recent Labs  Lab 05/13/20 1630  WBC 14.3*    CrCl cannot be calculated (Patient's most recent lab result is older than the maximum 21 days allowed.).    Allergies  Allergen Reactions  . Codeine Itching    Thank you for allowing pharmacy to be a part of this patient's care.  Regina Berry A 05/13/2020 9:51 PM

## 2020-05-13 NOTE — Anesthesia Preprocedure Evaluation (Signed)
Anesthesia Evaluation  Patient identified by MRN, date of birth, ID band Patient awake    Reviewed: Allergy & Precautions, NPO status , Patient's Chart, lab work & pertinent test results  Airway Mallampati: II  TM Distance: >3 FB Neck ROM: Full    Dental  (+) Dental Advisory Given   Pulmonary sleep apnea , COPD, Current Smoker,    breath sounds clear to auscultation       Cardiovascular hypertension, Pt. on medications  Rhythm:Regular Rate:Normal     Neuro/Psych negative neurological ROS     GI/Hepatic Neg liver ROS, GERD  ,  Endo/Other  Hypothyroidism   Renal/GU negative Renal ROS     Musculoskeletal  (+) Arthritis ,   Abdominal   Peds  Hematology negative hematology ROS (+)   Anesthesia Other Findings   Reproductive/Obstetrics                             Lab Results  Component Value Date   WBC 11.4 (H) 03/18/2020   HGB 10.3 (L) 03/18/2020   HCT 31.9 (L) 03/18/2020   MCV 91.4 03/18/2020   PLT 171 03/18/2020   Lab Results  Component Value Date   CREATININE 0.90 03/18/2020   BUN 17 03/18/2020   NA 137 03/18/2020   K 3.7 03/18/2020   CL 98 03/18/2020   CO2 29 03/18/2020    Anesthesia Physical Anesthesia Plan  ASA: III and emergent  Anesthesia Plan: General   Post-op Pain Management:    Induction: Intravenous  PONV Risk Score and Plan: 2 and Dexamethasone, Ondansetron and Treatment may vary due to age or medical condition  Airway Management Planned: Oral ETT  Additional Equipment:   Intra-op Plan:   Post-operative Plan: Extubation in OR  Informed Consent: I have reviewed the patients History and Physical, chart, labs and discussed the procedure including the risks, benefits and alternatives for the proposed anesthesia with the patient or authorized representative who has indicated his/her understanding and acceptance.     Dental advisory given  Plan Discussed  with: CRNA  Anesthesia Plan Comments:         Anesthesia Quick Evaluation

## 2020-05-13 NOTE — Op Note (Signed)
05/13/2020  8:00 PM  PATIENT:  Regina Berry    PRE-OPERATIVE DIAGNOSIS: Infected left prosthetic hip replacement  POST-OPERATIVE DIAGNOSIS:  Same  PROCEDURE: 1.  Incision, irrigation, debridement, left prosthetic hip abscess, with excisional debridement, removing subcutaneous tissue, liquefied fat, and purulent fluid 2.  Revision left total hip replacement with exchange of polyethylene liner on the acetabulum and the femoral head 3.  Placement of large wound VAC, left hip, 15 cm x 5 cm  SURGEON:  Johnny Bridge, MD  PHYSICIAN ASSISTANT: Merlene Pulling, PA-C, present and scrubbed throughout the case, critical for completion in a timely fashion, and for retraction, instrumentation, and closure.  ANESTHESIA:   General  PREOPERATIVE INDICATIONS:  Regina Berry is a  69 y.o. female who presented with left hip swelling, cellulitis, and progressive infection.  Sed rate and CRP were elevated, hip aspiration in the office was negative on the culture, and 80 cc of brown fluid was removed from the subcutaneous tissue by interventional radiology, which was also negative.  Nonetheless she had worsening fevers and chills, and I brought her urgently to surgical intervention.  The risks benefits and alternatives were discussed with the patient preoperatively including but not limited to the risks of infection, bleeding, nerve injury, cardiopulmonary complications, the need for revision surgery, among others, and the patient was willing to proceed.  We also discussed the risks and necessity for recurrent I&D, delayed primary closure, the potential for needing two-stage operative intervention, lifelong infection, among others.  ESTIMATED BLOOD LOSS: 300 mL  OPERATIVE IMPLANTS: I exchange the polyethylene for the same polyethylene, but a fresh polyethylene, I used the femoral head that I had removed, although soaked it in irrisept solution before reimplantation.  OPERATIVE FINDINGS: She had 1 L of  purulent fluid that had dehisced through the iliotibial band and down into the femoral acetabular joint.  The acetabulum and femoral prosthesis felt stable.  I did not appreciate significant bony involvement.  OPERATIVE PROCEDURE: The patient was brought to the operating room and placed in the supine position.  General anesthesia was administered.  She was turned into the lateral decubitus position.  All bony prominences were padded.  The left lower extremity was prepped and draped in usual sterile fashion.  Timeout performed.  Antibiotics were held until fluid was obtained.  I made an incision through the previous incision, and encountered a total of 1 L of purulent fluid.  This was evacuated and was fairly dramatic.  I then used a curette, as well as a rondure, and remove liquefied fat, and also cleaned the tissue edges both deep and superficial.  I irrigated 6 L of fluid.  I then dislocated the femoral head, remove the ball, and exposed the acetabulum.  I removed the acetabular liner.  I curetted the screw holes in the acetabular shell, and then cleaned the remainder of the soft tissue above the femoral implant, and the acetabulum.  I also remove the FiberWire sutures that were holding the posterior tissue.  I placed a half of a liter of irrisept fluid into the joint, bathing the entirety of the wound, left this to sit for 5 minutes.  I then removed the irrisept, and irrigated the wounds with another 6 L of saline.  I replaced the acetabular liner.  It felt like it sat down although one of the tabs looked like it had some plastic deformation, I am not sure if this was slightly offline, but felt to be stable and engaging all  of the tabs that I could see.  I then replaced the femoral head, irrigated with another 6 L of saline, after a second round of irrisept irrigation for 5 minutes, and then reduced the left hip, and then applied a large wound VAC.  I also placed vancomycin powder into the wound.  The  patient was turned supine, I had an excellent seal on the wound VAC, and return to the PACU in stable and satisfactory condition.  There were no complications and she tolerated the procedure well.  We will plan for 1 more irrigation and debridement with polyethylene exchange and femoral head exchange, probably Friday, and then PICC line antibiotics, and likely lifelong suppressive oral antibiotics.

## 2020-05-13 NOTE — Transfer of Care (Signed)
Immediate Anesthesia Transfer of Care Note  Patient: Regina Berry  Procedure(s) Performed: TOTAL HIP ARTHROPLASTY IRRIGATION AND DEBRIDEMENT, POLY EXCHANGE (Left Hip)  Patient Location: PACU  Anesthesia Type:General  Level of Consciousness: awake, alert  and oriented  Airway & Oxygen Therapy: Patient Spontanous Breathing and Patient connected to face mask oxygen  Post-op Assessment: Report given to RN and Post -op Vital signs reviewed and stable  Post vital signs: Reviewed and stable  Last Vitals:  Vitals Value Taken Time  BP 148/66 05/13/20 1951  Temp    Pulse 94 05/13/20 1953  Resp 24 05/13/20 1954  SpO2 100 % 05/13/20 1953  Vitals shown include unvalidated device data.  Last Pain:  Vitals:   05/13/20 1541  TempSrc: Oral  PainSc: 0-No pain         Complications: No complications documented.

## 2020-05-13 NOTE — H&P (Signed)
PREOPERATIVE H&P  Chief Complaint: Left hip pain and swelling  HPI: Regina Berry is a 69 y.o. female who underwent left total hip replacement performed March 17, 2020.  She presented back to the office about a week ago with increasing swelling, with some redness.  We performed an aspiration of her hip joint under ultrasound which was negative.  We also performed an aspiration under ultrasound guidance at interventional radiology of a subcutaneous fluid collection, which also did not grow out anything to my knowledge.  She started developing fevers and chills, with increasing drainage and swelling and redness around the hip, and presented back to our office today with worsening clinically.  She has a sick contact, with a sister who is recently being worked up with fever, and upper respiratory tract infection type symptoms.  Covid test taken today for [name] has been negative.  She denies significant pain in the hip itself, most of it is laterally.  Past Medical History:  Diagnosis Date  . Anemia   . Anxiety   . Arthritis    Lt hip  . Cancer ([place]) 2020   skin  Rt leg   . Chronic bronchitis ([place])   . COPD (chronic obstructive pulmonary disease) ([place])   . Dyspnea   . GERD (gastroesophageal reflux disease)   . Hypertension   . Hypothyroidism   . Obesity (BMI 30-39.9) 12/21/2017  . OSA on CPAP 12/21/2017   Severe with AHI 29.4/h and oxygen desaturations as low as 75%.  She is now on CPAP at 6 cm water pressure with 2 L oxygen due to nocturnal hypoxemia  . Pelvic pressure in female 01/23/2014  . Positive fecal occult blood test 06/23/2015  . Rectocele 01/23/2014  . 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 Thyroid disease    Past Surgical History:  Procedure Laterality Date  . CESAREAN SECTION    . COLONOSCOPY N/A 09/11/2015   Procedure: COLONOSCOPY;  Surgeon: Rogene Houston, MD;  Location: [facility];  Service: Endoscopy;  Laterality: N/A;  moved to 1/6 @ 1:35 - Ann to notify pt  . EYE SURGERY  Bilateral 2021  . LUNG SURGERY Left    infection on outside of lung that had to be"scaped off".  Tildon Husky REPAIR N/A 01/02/2018   Procedure: POSTERIOR REPAIR (RECTOCELE);  Surgeon: Jonnie Kind, MD;  Location: [facility]S;  Service: Gynecology;  Laterality: N/A;  . TOTAL HIP ARTHROPLASTY Left 03/17/2020   Procedure: TOTAL HIP ARTHROPLASTY;  Surgeon: Marchia Bond, MD;  Location: [facility];  Service: Orthopedics;  Laterality: Left;   Social History   Socioeconomic History  . Marital status: Married    Spouse name: Not on file  . Number of children: Not on file  . Years of education: Not on file  . Highest education level: Not on file  Occupational History  . Not on file  Tobacco Use  . Smoking status: Current Every Day Smoker    Packs/day: 1.00    Years: 40.00    Pack years: 40.00    Types: Cigarettes  . Smokeless tobacco: Never Used  Vaping Use  . Vaping Use: Never assessed  Substance and Sexual Activity  . Alcohol use: Yes    Alcohol/week: 0.0 standard drinks    Comment: occasionally  . Drug use: No  . Sexual activity: Not Currently    Birth control/protection: Post-menopausal  Other Topics Concern  . Not on file  Social History Narrative  . Not on file   Social Determinants  of Health   Financial Resource Strain:   . Difficulty of Paying Living Expenses: Not on file  Food Insecurity:   . Worried About Charity fundraiser in the Last Year: Not on file  . Ran Out of Food in the Last Year: Not on file  Transportation Needs:   . Lack of Transportation (Medical): Not on file  . Lack of Transportation (Non-Medical): Not on file  Physical Activity:   . Days of Exercise per Week: Not on file  . Minutes of Exercise per Session: Not on file  Stress:   . Feeling of Stress : Not on file  Social Connections:   . Frequency of Communication with Friends and Family: Not on file  . Frequency of Social Gatherings with Friends and Family: Not on file  . Attends Religious  Services: Not on file  . Active Member of Clubs or Organizations: Not on file  . Attends Archivist Meetings: Not on file  . Marital Status: Not on file   Family History  Problem Relation Age of Onset  . Diabetes Mother   . Cancer Mother        ovarian  . COPD Mother   . Diabetes Father   . Cancer Father        lymphoma  . Heart disease Father   . Other Daughter        transverse mylitis   Allergies  Allergen Reactions  . Codeine Itching   Prior to Admission medications   Medication Sig Start Date End Date Taking? Authorizing Provider  aspirin EC 325 MG tablet Take 1 tablet (325 mg total) by mouth 2 (two) times daily. 03/18/20  Yes Merlene Pulling K, PA-C  baclofen (LIORESAL) 10 MG tablet Take 1 tablet (10 mg total) by mouth 3 (three) times daily. As needed for muscle spasm 03/18/20  Yes Merlene Pulling K, PA-C  buPROPion (WELLBUTRIN XL) 300 MG 24 hr tablet Take 300 mg by mouth daily. 03/13/19  Yes [provider]  citalopram (CELEXA) 40 MG tablet Take 40 mg by mouth at bedtime.   Yes [provider]  fluticasone (FLONASE) 50 MCG/ACT nasal spray Place 2 sprays into both nostrils daily as needed (allergies.).    Yes [provider]  levothyroxine (SYNTHROID) 88 MCG tablet Take 88 mcg by mouth daily before breakfast.    Yes [provider]  losartan-hydrochlorothiazide (HYZAAR) 100-25 MG tablet Take 1 tablet by mouth daily.    Yes [provider]  Omega-3 Fatty Acids (FISH OIL) 1000 MG CAPS Take 1,000 mg by mouth daily.   Yes [provider]  oxyCODONE (ROXICODONE) 5 MG immediate release tablet Take 1 tablet (5 mg total) by mouth every 4 (four) hours as needed for severe pain. 03/18/20  Yes Merlene Pulling K, PA-C  polyethylene glycol powder (MIRALAX) powder Take 17 g by mouth daily. To prevent constipation Patient taking differently: Take 17 g by mouth daily as needed. To prevent constipation 01/02/18  Yes Jonnie Kind, MD   potassium chloride SA (K-DUR) 20 MEQ tablet Take 20 mEq by mouth daily.    Yes [provider]  sennosides-docusate sodium (SENOKOT-S) 8.6-50 MG tablet Take 2 tablets by mouth daily. 03/18/20  Yes Merlene Pulling K, PA-C  vitamin B-12 (CYANOCOBALAMIN) 1000 MCG tablet Take 1,000 mcg by mouth daily.   Yes [provider]  zolpidem (AMBIEN) 10 MG tablet Take 10 mg by mouth at bedtime as needed for sleep.    Yes [provider]  Calcium-Magnesium-Zinc (CAL-MAG-ZINC PO) Take 1 tablet by mouth daily.    [provider]  furosemide (LASIX) 20 MG tablet Take 20 mg by mouth daily as needed (fluid retention.).     [provider]  levocetirizine (XYZAL) 5 MG tablet Take 5 mg by mouth at bedtime.     [provider]  ondansetron (ZOFRAN) 4 MG tablet Take 1 tablet (4 mg total) by mouth every 8 (eight) hours as needed for nausea or vomiting. 03/18/20   Merlene Pulling K, PA-C     Positive ROS: All other systems have been reviewed and were otherwise negative with the exception of those mentioned in the HPI and as above.  Positive for fevers and chills and diaphoresis and sweating.  Physical Exam: General: Alert, no acute distress Cardiovascular: No pedal edema Respiratory: No cyanosis, no use of accessory musculature GI: No organomegaly, abdomen is soft and non-tender Skin: Her surgical wound is still opposed, but is significantly erythematous. Neurologic: Sensation intact distally Psychiatric: Patient is competent for consent with normal mood and affect Lymphatic: No axillary or cervical lymphadenopathy  MUSCULOSKELETAL: Left hip wound has substantial soft tissue swelling with cellulitis and abscess formation.  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   Plan: Plan for Procedure(s): Left hip incision, irrigation, debridement, exploration,  probable exchange of polyethylene liner and femoral head.  The risks benefits and alternatives were discussed with the patient including but not limited to the risks of nonoperative treatment, versus surgical intervention including infection, bleeding, nerve injury,  blood clots, cardiopulmonary complications, morbidity, mortality, among others, and they were willing to proceed.  We have also discussed the risks for persistent infection, need for revision surgery, even potential two-stage hip exchange arthroplasty, as well as the potential for repeat surgical debridement if the wound is severely contaminated, we may consider placing a wound VAC and coming back for a second debridement.  This will depend on intraoperative findings.  She is likely to need a PICC line, and we have held antibiotics in hopes of actually obtaining an organism.  All of our culture results so far have been negative.  Anticipated LOS equal to or greater than 2 midnights due to - Age 44 and older with one or more of the following:  - Obesity  - Expected need for hospital services (PT, OT, Nursing) required for safe  discharge  - Anticipated need for postoperative skilled nursing care or inpatient rehab  - Active co-morbidities: infection postoperative   Johnny Bridge, MD Cell [phone]   05/13/2020 5:12 PM

## 2020-05-13 NOTE — Progress Notes (Signed)
Peripherally Inserted Central Catheter Placement  The IV Nurse has discussed with the patient and/or persons authorized to consent for the patient, the purpose of this procedure and the potential benefits and risks involved with this procedure.  The benefits include less needle sticks, lab draws from the catheter, and the patient may be discharged home with the catheter. Risks include, but not limited to, infection, bleeding, blood clot (thrombus formation), and puncture of an artery; nerve damage and irregular heartbeat and possibility to perform a PICC exchange if needed/ordered by physician.  Alternatives to this procedure were also discussed.  Bard Power PICC patient education guide, fact sheet on infection prevention and patient information card has been provided to patient /or left at bedside.    PICC Placement Documentation  PICC Double Lumen 49/35/52 PICC Right Basilic 38 cm 0 cm (Active)  Indication for Insertion or Continuance of Line Home intravenous therapies (PICC only) 05/13/20 2253  Exposed Catheter (cm) 0 cm 05/13/20 2253  Site Assessment Clean;Dry;Intact 05/13/20 2253  Lumen #1 Status Blood return noted;Flushed;Saline locked 05/13/20 2253  Lumen #2 Status Blood return noted;Flushed;Saline locked 05/13/20 2253  Dressing Type Transparent;Occlusive;Securing device 05/13/20 2253  Dressing Status Clean;Dry;Intact;Antimicrobial disc in place 05/13/20 2253  Safety Lock Not Applicable 17/47/15 9539  Line Adjustment (NICU/IV Team Only) No 05/13/20 2253  Dressing Intervention New dressing 05/13/20 2253  Dressing Change Due 05/20/20 05/13/20 2253       Edson Snowball 05/13/2020, 11:16 PM

## 2020-05-13 NOTE — Anesthesia Procedure Notes (Signed)
Procedure Name: Intubation Date/Time: 05/13/2020 5:40 PM Performed by: Montel Clock, CRNA Pre-anesthesia Checklist: Patient identified, Emergency Drugs available, Suction available, Patient being monitored and Timeout performed Patient Re-evaluated:Patient Re-evaluated prior to induction Oxygen Delivery Method: Circle system utilized Preoxygenation: Pre-oxygenation with 100% oxygen Induction Type: IV induction Ventilation: Mask ventilation without difficulty and Oral airway inserted - appropriate to patient size Laryngoscope Size: 3 and Glidescope Grade View: Grade II Tube type: Oral Tube size: 7.0 mm Number of attempts: 1 Airway Equipment and Method: Video-laryngoscopy and Rigid stylet Placement Confirmation: ETT inserted through vocal cords under direct vision,  positive ETCO2 and breath sounds checked- equal and bilateral Secured at: 21 cm Tube secured with: Tape Dental Injury: Teeth and Oropharynx as per pre-operative assessment  Difficulty Due To: Difficult Airway- due to reduced neck mobility Comments: MAC 3 grade 3 view. Limited neck mobility/anterior airway. Glidescope 3. ETT passed with some manipulation (anterior). VSS.

## 2020-05-14 ENCOUNTER — Encounter (HOSPITAL_COMMUNITY): Payer: Self-pay | Admitting: Orthopedic Surgery

## 2020-05-14 ENCOUNTER — Encounter (HOSPITAL_COMMUNITY): Payer: Medicare Other

## 2020-05-14 ENCOUNTER — Other Ambulatory Visit: Payer: Self-pay

## 2020-05-14 DIAGNOSIS — I10 Essential (primary) hypertension: Secondary | ICD-10-CM

## 2020-05-14 DIAGNOSIS — T8452XA Infection and inflammatory reaction due to internal left hip prosthesis, initial encounter: Principal | ICD-10-CM

## 2020-05-14 DIAGNOSIS — A499 Bacterial infection, unspecified: Secondary | ICD-10-CM

## 2020-05-14 LAB — CBC
HCT: 28.8 % — ABNORMAL LOW (ref 36.0–46.0)
Hemoglobin: 9 g/dL — ABNORMAL LOW (ref 12.0–15.0)
MCH: 27.4 pg (ref 26.0–34.0)
MCHC: 31.3 g/dL (ref 30.0–36.0)
MCV: 87.5 fL (ref 80.0–100.0)
Platelets: 264 10*3/uL (ref 150–400)
RBC: 3.29 MIL/uL — ABNORMAL LOW (ref 3.87–5.11)
RDW: 14.6 % (ref 11.5–15.5)
WBC: 12 10*3/uL — ABNORMAL HIGH (ref 4.0–10.5)
nRBC: 0 % (ref 0.0–0.2)

## 2020-05-14 LAB — BASIC METABOLIC PANEL
Anion gap: 8 (ref 5–15)
BUN: 17 mg/dL (ref 8–23)
CO2: 27 mmol/L (ref 22–32)
Calcium: 8.9 mg/dL (ref 8.9–10.3)
Chloride: 102 mmol/L (ref 98–111)
Creatinine, Ser: 0.69 mg/dL (ref 0.44–1.00)
GFR calc Af Amer: >60 mL/min (ref >60)
GFR calc non Af Amer: >60 mL/min (ref >60)
Glucose, Bld: 188 mg/dL — ABNORMAL HIGH (ref 70–99)
Potassium: 4.6 mmol/L (ref 3.5–5.1)
Sodium: 137 mmol/L (ref 135–145)

## 2020-05-14 MED ORDER — VANCOMYCIN HCL 1250 MG/250ML IV SOLN: 1250.0000 mg | Freq: Two times a day (BID) | INTRAVENOUS | Status: DC

## 2020-05-14 MED ORDER — SODIUM CHLORIDE 0.9 % IV SOLN
750.0000 mg | Freq: Every day | INTRAVENOUS | Status: DC
  Administered 2020-05-14: 750 mg via INTRAVENOUS
  Filled 2020-05-14 (×2): qty 15

## 2020-05-14 NOTE — Progress Notes (Signed)
Pharmacy Antibiotic Note  Regina Berry is a 69 y.o. female admitted on 05/13/2020 with infected left prosthetic hip joint s/p I&D, poly exchange and wound vac placement 9/8.  Pharmacy has been consulted for vancomycin and Zosyn dosing.   Plan:  Zosyn every 8 hrs by 4-hr infusion  Vancomycin 1250mg  IV q12h  Follow up renal function daily, cultures, ID recommendations   Height: 5\' 3"  (160 cm) Weight: 93 kg (205 lb) IBW/kg (Calculated) : 52.4  Temp (24hrs), Avg:97.9 F (36.6 C), Min:97.3 F (36.3 C), Max:98.7 F (37.1 C)  Recent Labs  Lab 05/13/20 1630 05/14/20 0323  WBC 14.3* 12.0*  CREATININE  --  0.69    Estimated Creatinine Clearance: 72.9 mL/min (by C-G formula based on SCr of 0.69 mg/dL).    Allergies  Allergen Reactions  . Codeine Itching   Antimicrobials this admission:  9/8 Vancomycin >> 9/8 Zosyn >>  Dose adjustments this admission:   Microbiology results:  9/8 L hip: GPR, GNR, GPC  Thank you for allowing pharmacy to be a part of this patient's care.  Peggyann Juba, PharmD, BCPS Pharmacy: [phone] 05/14/2020 8:47 AM

## 2020-05-14 NOTE — Consult Note (Signed)
Date of Admission:  05/13/2020          Reason for Consult: Prosthetic hip infection    Referring Provider: Dr. Mardelle Matte   Assessment:  1. 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 HTN  Plan:  1. Changed to daptomycin and Zosyn 2. Follow-up culture data from the operating room 3. Hopefully we identify specific pathogen to be can target with IV followed by oral therapy with the patient needing a minimum of 1/2-year of antibiotic therapy. 4. Check sed rate and CRP 5. Check hepatitis serologies and HIV status if not done  Active Problems:   Left hip postoperative wound infection   Infected prosthesis of left hip (HCC)   Scheduled Meds: . acetaminophen  500 mg Oral Q6H  . buPROPion  300 mg Oral Daily  . Chlorhexidine Gluconate Cloth  6 each Topical Daily  . citalopram  40 mg Oral QHS  . docusate sodium  100 mg Oral BID  . enoxaparin (LOVENOX) injection  40 mg Subcutaneous Q24H  . losartan  100 mg Oral Daily   And  . hydrochlorothiazide  25 mg Oral Daily  . levothyroxine  88 mcg Oral QAC breakfast  . loratadine  10 mg Oral QHS  . potassium chloride SA  20 mEq Oral Daily  . vitamin B-12  1,000 mcg Oral Daily   Continuous Infusions: . 0.45 % NaCl with KCl 20 mEq / L Stopped (05/14/20 0211)  . DAPTOmycin (CUBICIN)  IV    . lactated ringers 50 mL/hr at 05/13/20 1725  . methocarbamol (ROBAXIN) IV 500 mg (05/13/20 2021)  . piperacillin-tazobactam (ZOSYN)  IV 3.375 g (05/14/20 1055)   PRN Meds:.[START ON 05/15/2020] acetaminophen, alum & mag hydroxide-simeth, bisacodyl, diphenhydrAMINE, fluticasone, furosemide, HYDROcodone-acetaminophen, HYDROcodone-acetaminophen, magnesium citrate, menthol-cetylpyridinium **OR** phenol, methocarbamol **OR** methocarbamol (ROBAXIN) IV, metoCLOPramide **OR** metoCLOPramide (REGLAN) injection, morphine injection, ondansetron **OR** ondansetron (ZOFRAN) IV, polyethylene glycol, sodium chloride flush,  zolpidem  HPI: Regina Berry is a 70 y.o. female with history of severe osteoarthritis requiring left hip total arthroplasty this past July.  She had done well but then developed an area near one of the sutures that had become erythematous and ultimately became dark.  She had this evaluated at Dr. Luanna Cole office where blood work was done which revealed to have an elevated sed rate and C-reactive protein.  An aspirate was taken from the hip which was negative for any organisms additionally 80 mL of brown fluid removed some subcutaneous tissue by interventional radiology which also failed to yield an organism.  She has not been on antibiotics.  Have she in the interim however had worsening pain in her hip as well as fevers and chills.  She was brought to the hospital for urgent surgical exploration and she underwent I&D where a large amount of pus was encountered nearly a liter of pus.  She underwent I&D and also exchange of the liner.  She has been placed on vancomycin and Zosyn postoperatively Gram stain is showing gram-negative rods gram-positive rods and gram-positive cocci on Gram stain cultures are still incubating.  Gram stain was suggest a polymicrobial infection.  Therefore I agree with covering her broadly although we will change her from vancomycin to daptomycin to reduce nephrotoxicity.  Hopefully we can identify specific organisms to target with IV and then oral therapy.     Review of Systems: Review of Systems  Constitutional: Positive for chills and fever. Negative for malaise/fatigue and weight loss.  HENT: Negative for congestion and sore throat.   Eyes: Negative for blurred vision and photophobia.  Respiratory: Negative for cough, shortness of breath and wheezing.   Cardiovascular: Negative for chest pain, palpitations and leg swelling.  Gastrointestinal: Negative for abdominal pain, blood in stool, constipation, diarrhea, heartburn, melena, nausea and vomiting.  Genitourinary:  Negative for dysuria, flank pain and hematuria.  Musculoskeletal: Positive for joint pain and myalgias. Negative for back pain and falls.  Skin: Negative for itching and rash.  Neurological: Negative for dizziness, focal weakness, loss of consciousness, weakness and headaches.  Endo/Heme/Allergies: Does not bruise/bleed easily.  Psychiatric/Behavioral: Negative for depression and suicidal ideas. The patient does not have insomnia.     Past Medical History:  Diagnosis Date  . Anemia   . Anxiety   . Arthritis    Lt hip  . Cancer ([place]) 2020   skin  Rt leg   . Chronic bronchitis ([place])   . COPD (chronic obstructive pulmonary disease) ([place])   . Dyspnea   . GERD (gastroesophageal reflux disease)   . Hypertension   . Hypothyroidism   . Obesity (BMI 30-39.9) 12/21/2017  . OSA on CPAP 12/21/2017   Severe with AHI 29.4/h and oxygen desaturations as low as 75%.  She is now on CPAP at 6 cm water pressure with 2 L oxygen due to nocturnal hypoxemia  . Pelvic pressure in female 01/23/2014  . Positive fecal occult blood test 06/23/2015  . Rectocele 01/23/2014  . 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 Thyroid disease     Social History   Tobacco Use  . Smoking status: Current Every Day Smoker    Packs/day: 1.00    Years: 40.00    Pack years: 40.00    Types: Cigarettes  . Smokeless tobacco: Never Used  Vaping Use  . Vaping Use: Never assessed  Substance Use Topics  . Alcohol use: Yes    Alcohol/week: 0.0 standard drinks    Comment: occasionally  . Drug use: No    Family History  Problem Relation Age of Onset  . Diabetes Mother   . Cancer Mother        ovarian  . COPD Mother   . Diabetes Father   . Cancer Father        lymphoma  . Heart disease Father   . Other Daughter        transverse mylitis   Allergies  Allergen Reactions  . Codeine Itching    OBJECTIVE: Blood pressure 108/65, pulse 84, temperature 97.6 F (36.4 C), temperature source Oral, resp. rate 17, height 5\' 3"  (1.6  m), weight 93 kg, SpO2 96 %.  Physical Exam Constitutional:      General: She is not in acute distress.    Appearance: Normal appearance. She is well-developed. She is not ill-appearing or diaphoretic.  HENT:     Head: Normocephalic and atraumatic.     Right Ear: Hearing and external ear normal.     Left Ear: Hearing and external ear normal.     Nose: No nasal deformity or rhinorrhea.  Eyes:     General: No scleral icterus.    Conjunctiva/sclera: Conjunctivae normal.     Right eye: Right conjunctiva is not injected.     Left eye: Left conjunctiva is not injected.     Pupils: Pupils are equal, round, and reactive to light.  Neck:     Vascular: No JVD.  Cardiovascular:     Rate and Rhythm: Normal rate and regular rhythm.  Heart sounds: Normal heart sounds, S1 normal and S2 normal. No murmur heard.  No friction rub.  Pulmonary:     Effort: Pulmonary effort is normal. No respiratory distress.     Breath sounds: Normal breath sounds. No stridor. No wheezing, rhonchi or rales.  Abdominal:     General: Bowel sounds are normal. There is no distension.     Palpations: Abdomen is soft.     Tenderness: There is no abdominal tenderness.  Musculoskeletal:     Right shoulder: Normal.     Left shoulder: Normal.     Cervical back: Normal range of motion and neck supple.     Right hip: Normal.     Left hip: Normal.     Right knee: Normal.     Left knee: Normal.  Lymphadenopathy:     Head:     Right side of head: No submandibular, preauricular or posterior auricular adenopathy.     Left side of head: No submandibular, preauricular or posterior auricular adenopathy.     Cervical: No cervical adenopathy.     Right cervical: No superficial or deep cervical adenopathy.    Left cervical: No superficial or deep cervical adenopathy.  Skin:    General: Skin is warm and dry.     Coloration: Skin is not pale.     Findings: No abrasion, bruising, ecchymosis, erythema, lesion or rash.      Nails: There is no clubbing.  Neurological:     General: No focal deficit present.     Mental Status: She is alert and oriented to person, place, and time.     Sensory: No sensory deficit.     Coordination: Coordination normal.     Gait: Gait normal.  Psychiatric:        Attention and Perception: She is attentive.        Mood and Affect: Mood normal.        Speech: Speech normal.        Behavior: Behavior normal. Behavior is cooperative.        Thought Content: Thought content normal.        Judgment: Judgment normal.    Right hip with dressing  Lab Results Lab Results  Component Value Date   WBC 12.0 (H) 05/14/2020   HGB 9.0 (L) 05/14/2020   HCT 28.8 (L) 05/14/2020   MCV 87.5 05/14/2020   PLT 264 05/14/2020    Lab Results  Component Value Date   CREATININE 0.69 05/14/2020   BUN 17 05/14/2020   NA 137 05/14/2020   K 4.6 05/14/2020   CL 102 05/14/2020   CO2 27 05/14/2020    Lab Results  Component Value Date   ALT 11 03/05/2020   AST 23 03/05/2020   ALKPHOS 60 03/05/2020   BILITOT 0.6 03/05/2020     Microbiology: Recent Results (from the past 240 hour(s))  Aerobic/Anaerobic Culture (surgical/deep wound)     Status: None   Collection Time: 05/07/20 12:00 PM   Specimen: PATH Cytology FNA; Body Fluid  Result Value Ref Range Status   Specimen Description CYTO FNA  Final   Special Requests NONE  Final   Gram Stain   Final    RARE WBC PRESENT,BOTH PMN AND MONONUCLEAR NO ORGANISMS SEEN    Culture   Final    No growth aerobically or anaerobically. Performed at [facility], [place] [address], [place], [place] [address]    Report Status 05/12/2020 FINAL  Final  SARS Coronavirus 2  by RT PCR (hospital order, performed in [facility] hospital lab) Nasopharyngeal Nasopharyngeal Swab     Status: None   Collection Time: 05/13/20  3:28 PM   Specimen: Nasopharyngeal Swab  Result Value Ref Range Status   SARS Coronavirus 2 NEGATIVE NEGATIVE Final    Comment:  (NOTE) SARS-CoV-2 target nucleic acids are NOT DETECTED.  The SARS-CoV-2 RNA is generally detectable in upper and lower respiratory specimens during the acute phase of infection. The lowest concentration of SARS-CoV-2 viral copies this assay can detect is 250 copies / mL. A negative result does not preclude SARS-CoV-2 infection and should not be used as the sole basis for treatment or other patient management decisions.  A negative result may occur with improper specimen collection / handling, submission of specimen other than nasopharyngeal swab, presence of viral mutation(s) within the areas targeted by this assay, and inadequate number of viral copies (<250 copies / mL). A negative result must be combined with clinical observations, patient history, and epidemiological information.  Fact Sheet for Patients:   StrictlyIdeas.no  Fact Sheet for Healthcare Providers: BankingDealers.co.za  This test is not yet approved or  cleared by the [place] FDA and has been authorized for detection and/or diagnosis of SARS-CoV-2 by FDA under an Emergency Use Authorization (EUA).  This EUA will remain in effect (meaning this test can be used) for the duration of the COVID-19 declaration under Section 564(b)(1) of the Act, 21 U.S.C. section 360bbb-3(b)(1), unless the authorization is terminated or revoked sooner.  Performed at [facility], [place] [address] [place], [place] [address]   Aerobic/Anaerobic Culture (surgical/deep wound)     Status: None (Preliminary result)   Collection Time: 05/13/20  6:11 PM   Specimen: Other Source; Body Fluid  Result Value Ref Range Status   Specimen Description   Final    HIP LEFT DEEP Performed at [place] [address] [place], [place] [address]    Special Requests   Final    NONE Performed at [facility], [place] [address], [place],  [place] [address]    Gram Stain   Final    ABUNDANT WBC PRESENT, PREDOMINANTLY PMN ABUNDANT GRAM POSITIVE RODS MODERATE GRAM NEGATIVE RODS RARE GRAM POSITIVE COCCI Performed at [facility], [place] [address], [place], [place] [address]    Culture PENDING  Incomplete   Report Status PENDING  Incomplete    Alcide Evener, [place] [facility] [phone] pager  05/14/2020, 1:27 PM

## 2020-05-14 NOTE — Plan of Care (Signed)
Plan of care reviewed and discussed with the patient. 

## 2020-05-14 NOTE — Evaluation (Signed)
Physical Therapy Evaluation Patient Details Name: Regina Berry MRN: [id number] DOB: [birth year] Today's Date: 05/14/2020   History of Present Illness  Patient is 69 y.o. female s/p I&D of L THR (Lt THA posterolateral approach on 03/17/20) with PMH significant for HTN, COPD, GERD, OA, anxiety, hypothyroidism.  Clinical Impression  Pt admitted as above and presenting with functional mobility limitations 2* decreased L LE strength/ROM, post op pain and posterior THP.  Pt should progress well to dc home with family assist.    Follow Up Recommendations Follow surgeon's recommendation for DC plan and follow-up therapies    Equipment Recommendations  None recommended by PT    Recommendations for Other Services       Precautions / Restrictions Precautions Precautions: Fall;Posterior Hip Precaution Booklet Issued: Yes (comment) Precaution Comments: Pt able to recall 2/3 hip precautions. She still requires intermittent cues to put them into practice Restrictions Weight Bearing Restrictions: No (Simultaneous filing. User may not have seen previous data.) Other Position/Activity Restrictions: WBAT      Mobility  Bed Mobility Overal bed mobility: Needs Assistance Bed Mobility: Sit to Supine       Sit to supine: Min guard   General bed mobility comments: cues for sequence and adherence to THP  Transfers Overall transfer level: Needs assistance Equipment used: Rolling walker (2 wheeled) Transfers: Sit to/from Stand Sit to Stand: Min guard         General transfer comment: min cues for LE management and for use of UEs to self assist  Ambulation/Gait Ambulation/Gait assistance: Min guard Gait Distance (Feet): 222 Feet Assistive device: Rolling walker (2 wheeled) Gait Pattern/deviations: Step-to pattern;Step-through pattern;Decreased stride length Gait velocity: decr   General Gait Details: Min guard for safety.  Min cues for posture and position from [employer] Mobility    Modified Rankin (Stroke Patients Only)       Balance Overall balance assessment: Needs assistance Sitting-balance support: Feet supported Sitting balance-Leahy Scale: Good     Standing balance support: Bilateral upper extremity supported Standing balance-Leahy Scale: Fair                               Pertinent Vitals/Pain Pain Assessment: 0-10 Pain Score: 2  Pain Location: L hip Pain Descriptors / Indicators: Discomfort;Sore;Aching Pain Intervention(s): Premedicated before session;Limited activity within patient's tolerance;Monitored during session;Ice applied    Home Living Family/patient expects to be discharged to:: Private residence Living Arrangements: Spouse/significant other Available Help at Discharge: Family Type of Home: House Home Access: Stairs to enter Entrance Stairs-Rails: None Entrance Stairs-Number of Steps: 1 Home Layout: One level Home Equipment: Cane - single point;Walker - 2 wheels;Bedside commode      Prior Function Level of Independence: Independent               Hand Dominance   Dominant Hand: Right    Extremity/Trunk Assessment   Upper Extremity Assessment Upper Extremity Assessment: Overall WFL for tasks assessed    Lower Extremity Assessment Lower Extremity Assessment: LLE deficits/detail LLE Deficits / Details: 3/5 a hip    Cervical / Trunk Assessment Cervical / Trunk Assessment: Normal  Communication   Communication: No difficulties  Cognition Arousal/Alertness: Awake/alert Behavior During Therapy: WFL for tasks assessed/performed Overall Cognitive Status: Within Functional Limits for tasks assessed  General Comments      Exercises Total Joint Exercises Ankle Circles/Pumps: AROM;Both;10 reps   Assessment/Plan    PT Assessment Patient needs continued PT services  PT Problem List Decreased strength;Decreased range  of motion;Decreased activity tolerance;Decreased balance;Decreased mobility;Decreased knowledge of use of DME;Decreased knowledge of precautions;Pain       PT Treatment Interventions DME instruction;Gait training;Stair training;Functional mobility training;Therapeutic activities;Balance training;Therapeutic exercise;Patient/family education    PT Goals (Current goals can be found in the Care Plan section)  Acute Rehab PT Goals Patient Stated Goal: recover with therapy and get back to independence PT Goal Formulation: With patient Time For Goal Achievement: 03/24/20 Potential to Achieve Goals: Good    Frequency 7X/week   Barriers to discharge        Co-evaluation               AM-PAC PT "6 Clicks" Mobility  Outcome Measure Help needed turning from your back to your side while in a flat bed without using bedrails?: A Little Help needed moving from lying on your back to sitting on the side of a flat bed without using bedrails?: A Little Help needed moving to and from a bed to a chair (including a wheelchair)?: A Little Help needed standing up from a chair using your arms (e.g., wheelchair or bedside chair)?: A Little Help needed to walk in hospital room?: A Little Help needed climbing 3-5 steps with a railing? : A Little 6 Click Score: 18    End of Session Equipment Utilized During Treatment: Gait belt Activity Tolerance: Patient tolerated treatment well Patient left: in bed;with call bell/phone within reach;with bed alarm set Nurse Communication: Mobility status PT Visit Diagnosis: Muscle weakness (generalized) (M62.81);Difficulty in walking, not elsewhere classified (R26.2)    Time: 0940-1005 PT Time Calculation (min) (ACUTE ONLY): 25 min   Charges:     PT Treatments $Gait Training: 8-22 mins        [place] Pager [phone] Office [phone]   Lety Cullens 05/14/2020, 10:25 AM

## 2020-05-14 NOTE — TOC Initial Note (Addendum)
Transition of Care [facility]) - Initial/Assessment Note    Patient Details  Name: Regina Berry MRN: [id number] Date of Birth: 07-18-1951  Transition of Care [facility]) CM/SW Contact:    Lennart Pall, LCSW Phone Number: 05/14/2020, 1:04 PM  Clinical Narrative:  Met with pt to begin reviewing for dc needs.  Pt with recent hip surgery (July) and is part of Ortho Bundle program.  She has a supportive spouse at home.  Notes she has had HH and OPPT prior and has all needed DME. She understands that it is anticipated she will need IV abx at home.  She is aware that I will assist with getting this arranged along with [facility] again.  Spoke with Ladell Heads, RN with Dr. Jerrye Bushy who confirms that [facility] did follow after last dc.  She is aware and agreeable with plan to use them again if they can staff the case for [facility], PT and to use [facility]) to manage abx.  Will monitor dc readiness and alert all involved when pt cleared to go home.       AddendumDelane Ginger cannot staff case - reaching out to other agencies         Expected Discharge Plan: [place] Barriers to Discharge: Continued Medical Work up   Patient Goals and CMS Choice Patient states their goals for this hospitalization and ongoing recovery are:: to return home CMS Medicare.gov Compare Post Acute Care list provided to:: Patient Choice offered to / list presented to : Patient  Expected Discharge Plan and Services Expected Discharge Plan: [place] In-house Referral: Clinical Social Work   Post Acute Care Choice: [place] arrangements for the past 2 months: [place]                                      Prior Living Arrangements/Services Living arrangements for the past 2 months: Single Family Home Lives with:: Spouse Patient language and need for interpreter reviewed:: Yes Do you feel safe going back to the place where you live?: Yes      Need for Family  Participation in Patient Care: Yes (Comment) Care giver support system in place?: Yes (comment)   Criminal Activity/Legal Involvement Pertinent to Current Situation/Hospitalization: No - Comment as needed  Activities of Daily Living Home Assistive Devices/Equipment: CPAP ADL Screening (condition at time of admission) Patient's cognitive ability adequate to safely complete daily activities?: Yes Is the patient deaf or have difficulty hearing?: No Does the patient have difficulty seeing, even when wearing glasses/contacts?: No Does the patient have difficulty concentrating, remembering, or making decisions?: No Patient able to express need for assistance with ADLs?: Yes Does the patient have difficulty dressing or bathing?: No Independently performs ADLs?: Yes (appropriate for developmental age) Does the patient have difficulty walking or climbing stairs?: Yes Weakness of Legs: Left Weakness of Arms/Hands: None  Permission Sought/Granted Permission sought to share information with : Family Supports Permission granted to share information with : Yes, Verbal Permission Granted  Share Information with NAME: Ronnae Kaser     Permission granted to share info w Relationship: spouse  Permission granted to share info w Contact Information: [phone]  Emotional Assessment Appearance:: Appears stated age Attitude/Demeanor/Rapport: Gracious Affect (typically observed): Accepting, Pleasant Orientation: : Oriented to Self, Oriented to Place, Oriented to  Time, Oriented to Situation Alcohol / Substance Use: Not  Applicable Psych Involvement: No (comment)  Admission diagnosis:  Left hip postoperative wound infection [T81.49XA] Infected prosthesis of left hip [facility]) [T84.52XA] Patient Active Problem List   Diagnosis Date Noted  . Left hip postoperative wound infection 05/13/2020  . Infected prosthesis of left hip ([place]) 05/13/2020  . 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  Osteoarthritis of left hip 03/17/2020  . Enterocele 01/02/2018  . OSA on CPAP 12/21/2017  . Obesity (BMI 30-39.9) 12/21/2017  . Depression 08/18/2015  . Positive fecal occult blood test 06/23/2015  . Pelvic pressure in female 01/23/2014  . Rectocele 01/23/2014   PCP:  Sharilyn Sites, MD Pharmacy:   [place], [place] - [place] [address] [place] [address] [place] [address] Phone: [phone] Fax: [phone]     Social Determinants of Health (SDOH) Interventions    Readmission Risk Interventions Readmission Risk Prevention Plan 05/14/2020  Post Dischage Appt Complete  Medication Screening Complete  Transportation Screening Complete  Some recent data might be hidden

## 2020-05-14 NOTE — Progress Notes (Signed)
Physical Therapy Treatment Patient Details Name: Regina Berry MRN: [id number] DOB: 05/14/1951 Today's Date: 05/14/2020    History of Present Illness Patient is 69 y.o. female s/p I&D of L THR (Lt THA posterolateral approach on 03/17/20) with PMH significant for HTN, COPD, GERD, OA, anxiety, hypothyroidism.    PT Comments    Pt progressing well with mobility but states will have another I&D tomorrow - on OR schedule tomorrow pm.     Follow Up Recommendations  Follow surgeons recommendation for DC plan and follow-up therapies     Equipment Recommendations  None recommended by PT    Recommendations for Other Services       Precautions / Restrictions Precautions Precautions: Fall;Posterior Hip Precaution Booklet Issued: Yes (comment) Precaution Comments: Pt able to recall 3/3 hip precautions. She still requires intermittent cues to put them into practice Restrictions Other Position/Activity Restrictions: WBAT    Mobility  Bed Mobility Overal bed mobility: Needs Assistance Bed Mobility: Sit to Supine;Supine to Sit     Supine to sit: Min guard Sit to supine: Min guard   General bed mobility comments: cues for sequence and adherence to THP  Transfers Overall transfer level: Needs assistance Equipment used: Rolling walker (2 wheeled) Transfers: Sit to/from Stand Sit to Stand: Min guard         General transfer comment: min cues for LE management and for use of UEs to self assist  Ambulation/Gait Ambulation/Gait assistance: Min guard Gait Distance (Feet): 400 Feet Assistive device: Rolling walker (2 wheeled) Gait Pattern/deviations: Step-to pattern;Step-through pattern;Decreased stride length Gait velocity: decr   General Gait Details: Min guard for safety.  Min cues for posture and position from [employer] Mobility    Modified Rankin (Stroke Patients Only)       Balance Overall balance assessment: Needs  assistance Sitting-balance support: Feet supported Sitting balance-Leahy Scale: Good     Standing balance support: Bilateral upper extremity supported Standing balance-Leahy Scale: Fair                              Cognition Arousal/Alertness: Awake/alert Behavior During Therapy: WFL for tasks assessed/performed Overall Cognitive Status: Within Functional Limits for tasks assessed                                        Exercises      General Comments        Pertinent Vitals/Pain Pain Assessment: 0-10 Pain Score: 3  Pain Location: L hip Pain Descriptors / Indicators: Discomfort;Sore;Aching Pain Intervention(s): Limited activity within patient's tolerance;Monitored during session;Ice applied    Home Living                      Prior Function            PT Goals (current goals can now be found in the care plan section) Acute Rehab PT Goals Patient Stated Goal: recover with therapy and get back to independence PT Goal Formulation: With patient Time For Goal Achievement: 03/24/20 Potential to Achieve Goals: Good Progress towards PT goals: Progressing toward goals    Frequency    7X/week      PT Plan Current plan remains appropriate    Co-evaluation  AM-PAC PT "6 Clicks" Mobility   Outcome Measure  Help needed turning from your back to your side while in a flat bed without using bedrails?: A Little Help needed moving from lying on your back to sitting on the side of a flat bed without using bedrails?: A Little Help needed moving to and from a bed to a chair (including a wheelchair)?: A Little Help needed standing up from a chair using your arms (e.g., wheelchair or bedside chair)?: A Little Help needed to walk in hospital room?: A Little Help needed climbing 3-5 steps with a railing? : A Little 6 Click Score: 18    End of Session Equipment Utilized During Treatment: Gait belt Activity Tolerance:  Patient tolerated treatment well Patient left: in bed;with call bell/phone within reach;with bed alarm set;with family/visitor present Nurse Communication: Mobility status PT Visit Diagnosis: Muscle weakness (generalized) (M62.81);Difficulty in walking, not elsewhere classified (R26.2)     Time: 9791-5041 PT Time Calculation (min) (ACUTE ONLY): 22 min  Charges:  $Gait Training: 8-22 mins                     [place] Pager [phone] Office [phone]    [facility] 05/14/2020, 4:37 PM

## 2020-05-14 NOTE — Progress Notes (Addendum)
Subjective: 1 Day Post-Op s/p Procedure(s): TOTAL HIP ARTHROPLASTY IRRIGATION AND DEBRIDEMENT, POLY EXCHANGE  Patient reports no pain in left hip this morning. Denies chest pain, calf pain , nausea or vomiting. She states she had some shortness of breath in the hours after surgery but this has much improved and she feels she is back to her baseline. She states she has been sweaty overnight, without chills, causing her to change her gown twice.   Objective:  PE: VITALS:   Vitals:   05/13/20 2045 05/13/20 2103 05/13/20 2157 05/14/20 0147  BP: (!) 117/51 123/68 (!) 107/54 (!) 101/58  Pulse: 89 93 90 78  Resp: 20 16 16 18   Temp: 98 F (36.7 C) 98.1 F (36.7 C) 98 F (36.7 C) (!) 97.3 F (36.3 C)  TempSrc:  Oral Oral Oral  SpO2: 99% 100% 98% 96%  Weight:      Height:       General: in no acute distress GI: Abdomen soft, non-tender MSK:  Neurovascular intact Sensation intact distally Intact pulses distally Dorsiflexion/Plantar flexion intact  Incision - wound vac in place with 100cc's in canister, good seal Continued edema and erythema seen surrounding incision site    LABS  Results for orders placed or performed during the hospital encounter of 05/13/20 (from the past 24 hour(s))  SARS Coronavirus 2 by RT PCR (hospital order, performed in [place] hospital lab) Nasopharyngeal Nasopharyngeal Swab     Status: None   Collection Time: 05/13/20  3:28 PM   Specimen: Nasopharyngeal Swab  Result Value Ref Range   SARS Coronavirus 2 NEGATIVE NEGATIVE  CBC with Differential/Platelet     Status: Abnormal   Collection Time: 05/13/20  4:30 PM  Result Value Ref Range   WBC 14.3 (H) 4.0 - 10.5 K/uL   RBC 4.00 3.87 - 5.11 MIL/uL   Hemoglobin 11.0 (L) 12.0 - 15.0 g/dL   HCT 34.8 (L) 36 - 46 %   MCV 87.0 80.0 - 100.0 fL   MCH 27.5 26.0 - 34.0 pg   MCHC 31.6 30.0 - 36.0 g/dL   RDW 14.6 11.5 - 15.5 %   Platelets 301 150 - 400 K/uL   nRBC 0.0 0.0 - 0.2 %   Neutrophils  Relative % 73 %   Neutro Abs 10.4 (H) 1.7 - 7.7 K/uL   Lymphocytes Relative 13 %   Lymphs Abs 1.8 0.7 - 4.0 K/uL   Monocytes Relative 14 %   Monocytes Absolute 2.0 (H) 0 - 1 K/uL   Eosinophils Relative 0 %   Eosinophils Absolute 0.0 0 - 0 K/uL   Basophils Relative 0 %   Basophils Absolute 0.1 0 - 0 K/uL   WBC Morphology TOXIC GRANULATION    Immature Granulocytes 0 %   Abs Immature Granulocytes 0.06 0.00 - 0.07 K/uL  Aerobic/Anaerobic Culture (surgical/deep wound)     Status: None (Preliminary result)   Collection Time: 05/13/20  6:11 PM   Specimen: Other Source; Body Fluid  Result Value Ref Range   Specimen Description      HIP LEFT DEEP Performed at [facility] [address], [place], [place] [address]    Special Requests      NONE Performed at [facility], [place] [address] [place], [place] [address]    Gram Stain      ABUNDANT WBC PRESENT, PREDOMINANTLY PMN ABUNDANT GRAM POSITIVE RODS MODERATE GRAM NEGATIVE RODS RARE GRAM POSITIVE COCCI Performed at [facility]  [facility], [address] [place], [place] [address]    Culture PENDING    Report Status PENDING   CBC     Status: Abnormal   Collection Time: 05/14/20  3:23 AM  Result Value Ref Range   WBC 12.0 (H) 4.0 - 10.5 K/uL   RBC 3.29 (L) 3.87 - 5.11 MIL/uL   Hemoglobin 9.0 (L) 12.0 - 15.0 g/dL   HCT 28.8 (L) 36 - 46 %   MCV 87.5 80.0 - 100.0 fL   MCH 27.4 26.0 - 34.0 pg   MCHC 31.3 30.0 - 36.0 g/dL   RDW 14.6 11.5 - 15.5 %   Platelets 264 150 - 400 K/uL   nRBC 0.0 0.0 - 0.2 %  Basic metabolic panel     Status: Abnormal   Collection Time: 05/14/20  3:23 AM  Result Value Ref Range   Sodium 137 135 - 145 mmol/L   Potassium 4.6 3.5 - 5.1 mmol/L   Chloride 102 98 - 111 mmol/L   CO2 27 22 - 32 mmol/L   Glucose, Bld 188 (H) 70 - 99 mg/dL   BUN 17 8 - 23 mg/dL   Creatinine, Ser 0.69 0.44 - 1.00 mg/dL   Calcium 8.9 8.9 - 10.3 mg/dL   GFR calc non Af Amer >60 >60 mL/min   GFR  calc Af Amer >60 >60 mL/min   Anion gap 8 5 - 15    DG Pelvis Portable  Result Date: 05/13/2020 CLINICAL DATA:  Left hip arthroplasty EXAM: PORTABLE PELVIS 1-2 VIEWS; DG HIP (WITH OR WITHOUT PELVIS) 1V PORT LEFT COMPARISON:  03/17/2020 FINDINGS: Pelvis: Frontal view of the lower pelvis including both hips was performed. Iliac crests and sacrum are excluded by collimation. Left hip arthroplasty is identified in stable position with no evidence of complication. No acute displaced fracture. Left hip: 3 cross-table lateral views of the left hip are obtained. Left hip arthroplasty is in expected position with no signs of complication. No evidence of fracture. IMPRESSION: 1. Unremarkable left hip arthroplasty. Electronically Signed   By: Randa Ngo M.D.   On: 05/13/2020 20:46   DG Hip Port Unilat With Pelvis 1V Left  Result Date: 05/13/2020 CLINICAL DATA:  Left hip arthroplasty EXAM: PORTABLE PELVIS 1-2 VIEWS; DG HIP (WITH OR WITHOUT PELVIS) 1V PORT LEFT COMPARISON:  03/17/2020 FINDINGS: Pelvis: Frontal view of the lower pelvis including both hips was performed. Iliac crests and sacrum are excluded by collimation. Left hip arthroplasty is identified in stable position with no evidence of complication. No acute displaced fracture. Left hip: 3 cross-table lateral views of the left hip are obtained. Left hip arthroplasty is in expected position with no signs of complication. No evidence of fracture. IMPRESSION: 1. Unremarkable left hip arthroplasty. Electronically Signed   By: Randa Ngo M.D.   On: 05/13/2020 20:46   [place] EKG SITE RITE  Result Date: 05/13/2020 If Site Rite image not attached, placement could not be confirmed due to current cardiac rhythm.   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 Mardelle Matte tomorrow afternoon  Weightbearing: WBAT LLE Insicional and dressing care: wound vac to remain in place VTE prophylaxis: Lovenox 40mg  qd x 30 days Pain control: continue current regimen  Contact information:   Weekdays 8-5 Merlene Pulling, PA-C [phone] A fter hours and holidays please check [email or web address] for group call information for [facility]  Ventura Bruns 05/14/2020, 6:38 AM

## 2020-05-15 ENCOUNTER — Inpatient Hospital Stay (HOSPITAL_COMMUNITY): Payer: Medicare Other | Admitting: Certified Registered Nurse Anesthetist

## 2020-05-15 ENCOUNTER — Inpatient Hospital Stay (HOSPITAL_COMMUNITY): Payer: Medicare Other

## 2020-05-15 ENCOUNTER — Encounter (HOSPITAL_COMMUNITY): Payer: Self-pay | Admitting: Orthopedic Surgery

## 2020-05-15 ENCOUNTER — Encounter (HOSPITAL_COMMUNITY)
Admission: AD | Disposition: A | Discharge status: Home / Self Care | Payer: Self-pay | Source: Ambulatory Visit | Attending: Orthopedic Surgery

## 2020-05-15 DIAGNOSIS — T8452XD Infection and inflammatory reaction due to internal left hip prosthesis, subsequent encounter: Secondary | ICD-10-CM

## 2020-05-15 DIAGNOSIS — T8149XA Infection following a procedure, other surgical site, initial encounter: Secondary | ICD-10-CM

## 2020-05-15 HISTORY — PX: APPLICATION OF WOUND VAC: SHX5189

## 2020-05-15 HISTORY — PX: INCISION AND DRAINAGE HIP: SHX1801

## 2020-05-15 LAB — CREATININE, SERUM
Creatinine, Ser: 0.81 mg/dL (ref 0.44–1.00)
GFR calc Af Amer: >60 mL/min (ref >60)
GFR calc non Af Amer: >60 mL/min (ref >60)

## 2020-05-15 LAB — MRSA PCR SCREENING: MRSA by PCR: NEGATIVE

## 2020-05-15 LAB — BASIC METABOLIC PANEL
Anion gap: 9 (ref 5–15)
BUN: 15 mg/dL (ref 8–23)
CO2: 28 mmol/L (ref 22–32)
Calcium: 9.2 mg/dL (ref 8.9–10.3)
Chloride: 102 mmol/L (ref 98–111)
Creatinine, Ser: 0.78 mg/dL (ref 0.44–1.00)
GFR calc Af Amer: >60 mL/min (ref >60)
GFR calc non Af Amer: >60 mL/min (ref >60)
Glucose, Bld: 68 mg/dL — ABNORMAL LOW (ref 70–99)
Potassium: 3.7 mmol/L (ref 3.5–5.1)
Sodium: 139 mmol/L (ref 135–145)

## 2020-05-15 LAB — HEPATITIS B SURFACE ANTIGEN: Hepatitis B Surface Ag: NONREACTIVE

## 2020-05-15 LAB — CK: Total CK: 14 U/L — ABNORMAL LOW (ref 38–234)

## 2020-05-15 LAB — POCT I-STAT, CHEM 8
BUN: 13 mg/dL (ref 8–23)
Calcium, Ion: 1.28 mmol/L (ref 1.15–1.40)
Chloride: 100 mmol/L (ref 98–111)
Creatinine, Ser: 0.8 mg/dL (ref 0.44–1.00)
Glucose, Bld: 74 mg/dL (ref 70–99)
HCT: 28 % — ABNORMAL LOW (ref 36.0–46.0)
Hemoglobin: 9.5 g/dL — ABNORMAL LOW (ref 12.0–15.0)
Potassium: 3.9 mmol/L (ref 3.5–5.1)
Sodium: 141 mmol/L (ref 135–145)
TCO2: 27 mmol/L (ref 22–32)

## 2020-05-15 LAB — CBC
HCT: 27.3 % — ABNORMAL LOW (ref 36.0–46.0)
Hemoglobin: 8.7 g/dL — ABNORMAL LOW (ref 12.0–15.0)
MCH: 28.1 pg (ref 26.0–34.0)
MCHC: 31.9 g/dL (ref 30.0–36.0)
MCV: 88.1 fL (ref 80.0–100.0)
Platelets: 366 10*3/uL (ref 150–400)
RBC: 3.1 MIL/uL — ABNORMAL LOW (ref 3.87–5.11)
RDW: 14.9 % (ref 11.5–15.5)
WBC: 20.1 10*3/uL — ABNORMAL HIGH (ref 4.0–10.5)
nRBC: 0 % (ref 0.0–0.2)

## 2020-05-15 LAB — C-REACTIVE PROTEIN: CRP: 15 mg/dL — ABNORMAL HIGH (ref <1.0)

## 2020-05-15 LAB — HIV ANTIBODY (ROUTINE TESTING W REFLEX): HIV Screen 4th Generation wRfx: NONREACTIVE

## 2020-05-15 LAB — SEDIMENTATION RATE: Sed Rate: 72 mm/hr — ABNORMAL HIGH (ref 0–22)

## 2020-05-15 LAB — HEPATITIS C ANTIBODY: HCV Ab: NONREACTIVE

## 2020-05-15 LAB — HEPATITIS A ANTIBODY, TOTAL: hep A Total Ab: NONREACTIVE

## 2020-05-15 SURGERY — IRRIGATION AND DEBRIDEMENT HIP
Anesthesia: General | Laterality: Left

## 2020-05-15 MED ORDER — PROMETHAZINE HCL 25 MG/ML IJ SOLN: 6.2500 mg | INTRAMUSCULAR | Status: DC | PRN

## 2020-05-15 MED ORDER — MIDAZOLAM HCL 5 MG/5ML IJ SOLN
INTRAMUSCULAR | Status: DC | PRN
  Administered 2020-05-15: 2 mg via INTRAVENOUS

## 2020-05-15 MED ORDER — EPHEDRINE SULFATE-NACL 50-0.9 MG/10ML-% IV SOSY
PREFILLED_SYRINGE | INTRAVENOUS | Status: DC | PRN
  Administered 2020-05-15: 10 mg via INTRAVENOUS

## 2020-05-15 MED ORDER — ROCURONIUM BROMIDE 10 MG/ML (PF) SYRINGE
PREFILLED_SYRINGE | INTRAVENOUS | Status: AC
  Filled 2020-05-15: qty 10

## 2020-05-15 MED ORDER — FENTANYL CITRATE (PF) 100 MCG/2ML IJ SOLN
25.0000 ug | INTRAMUSCULAR | Status: DC | PRN
  Administered 2020-05-15: 50 ug via INTRAVENOUS

## 2020-05-15 MED ORDER — ONDANSETRON HCL 4 MG/2ML IJ SOLN
INTRAMUSCULAR | Status: AC
  Filled 2020-05-15: qty 4

## 2020-05-15 MED ORDER — ENOXAPARIN SODIUM 40 MG/0.4ML ~~LOC~~ SOLN: 40.0000 mg | SUBCUTANEOUS | 0 refills | Status: DC

## 2020-05-15 MED ORDER — FENTANYL CITRATE (PF) 100 MCG/2ML IJ SOLN
INTRAMUSCULAR | Status: AC
  Filled 2020-05-15: qty 2

## 2020-05-15 MED ORDER — SODIUM CHLORIDE 0.9 % IV SOLN: INTRAVENOUS | Status: DC

## 2020-05-15 MED ORDER — ENSURE PRE-SURGERY PO LIQD
296.0000 mL | Freq: Once | ORAL | Status: AC
  Administered 2020-05-15: 296 mL via ORAL
  Filled 2020-05-15: qty 296

## 2020-05-15 MED ORDER — SUCCINYLCHOLINE CHLORIDE 200 MG/10ML IV SOSY
PREFILLED_SYRINGE | INTRAVENOUS | Status: AC
  Filled 2020-05-15: qty 10

## 2020-05-15 MED ORDER — FENTANYL CITRATE (PF) 250 MCG/5ML IJ SOLN
INTRAMUSCULAR | Status: AC
  Filled 2020-05-15: qty 5

## 2020-05-15 MED ORDER — LIDOCAINE 2% (20 MG/ML) 5 ML SYRINGE
INTRAMUSCULAR | Status: DC | PRN
  Administered 2020-05-15: 80 mg via INTRAVENOUS

## 2020-05-15 MED ORDER — ONDANSETRON HCL 4 MG/2ML IJ SOLN
INTRAMUSCULAR | Status: AC
  Filled 2020-05-15: qty 2

## 2020-05-15 MED ORDER — DEXAMETHASONE SODIUM PHOSPHATE 10 MG/ML IJ SOLN
INTRAMUSCULAR | Status: AC
  Filled 2020-05-15: qty 2

## 2020-05-15 MED ORDER — SODIUM CHLORIDE 0.9 % IR SOLN
Status: DC | PRN
  Administered 2020-05-15 (×2): 3000 mL

## 2020-05-15 MED ORDER — MIDAZOLAM HCL 2 MG/2ML IJ SOLN
INTRAMUSCULAR | Status: AC
  Filled 2020-05-15: qty 2

## 2020-05-15 MED ORDER — PROPOFOL 10 MG/ML IV BOLUS
INTRAVENOUS | Status: AC
  Filled 2020-05-15: qty 20

## 2020-05-15 MED ORDER — METHOCARBAMOL 500 MG PO TABS: 500.0000 mg | ORAL_TABLET | Freq: Four times a day (QID) | ORAL | Status: DC | PRN

## 2020-05-15 MED ORDER — STERILE WATER FOR IRRIGATION IR SOLN
Status: DC | PRN
  Administered 2020-05-15: 2000 mL

## 2020-05-15 MED ORDER — SUCCINYLCHOLINE CHLORIDE 20 MG/ML IJ SOLN
INTRAMUSCULAR | Status: DC | PRN
  Administered 2020-05-15: 120 mg via INTRAVENOUS

## 2020-05-15 MED ORDER — ONDANSETRON HCL 4 MG/2ML IJ SOLN
INTRAMUSCULAR | Status: DC | PRN
  Administered 2020-05-15: 4 mg via INTRAVENOUS

## 2020-05-15 MED ORDER — 0.9 % SODIUM CHLORIDE (POUR BTL) OPTIME
TOPICAL | Status: DC | PRN
  Administered 2020-05-15: 1000 mL

## 2020-05-15 MED ORDER — FENTANYL CITRATE (PF) 100 MCG/2ML IJ SOLN
INTRAMUSCULAR | Status: DC | PRN
Start: 2020-05-15 — End: 2020-05-15
  Administered 2020-05-15: 25 ug via INTRAVENOUS
  Administered 2020-05-15 (×2): 50 ug via INTRAVENOUS
  Administered 2020-05-15: 25 ug via INTRAVENOUS
  Administered 2020-05-15 (×2): 50 ug via INTRAVENOUS

## 2020-05-15 MED ORDER — SODIUM CHLORIDE 0.9 % IV SOLN
2.0000 g | INTRAVENOUS | Status: DC
  Administered 2020-05-16 – 2020-05-17 (×2): 2 g via INTRAVENOUS
  Filled 2020-05-15: qty 20
  Filled 2020-05-15: qty 2

## 2020-05-15 MED ORDER — CEFTRIAXONE IV (FOR PTA / DISCHARGE USE ONLY): 2.0000 g | INTRAVENOUS | 0 refills | Status: AC

## 2020-05-15 MED ORDER — PROPOFOL 10 MG/ML IV BOLUS
INTRAVENOUS | Status: DC | PRN
  Administered 2020-05-15: 150 mg via INTRAVENOUS

## 2020-05-15 MED ORDER — PHENYLEPHRINE 40 MCG/ML (10ML) SYRINGE FOR IV PUSH (FOR BLOOD PRESSURE SUPPORT)
PREFILLED_SYRINGE | INTRAVENOUS | Status: AC
  Filled 2020-05-15: qty 10

## 2020-05-15 MED ORDER — ROCURONIUM BROMIDE 10 MG/ML (PF) SYRINGE
PREFILLED_SYRINGE | INTRAVENOUS | Status: DC | PRN
  Administered 2020-05-15: 40 mg via INTRAVENOUS
  Administered 2020-05-15: 10 mg via INTRAVENOUS

## 2020-05-15 MED ORDER — SUGAMMADEX SODIUM 200 MG/2ML IV SOLN
INTRAVENOUS | Status: DC | PRN
  Administered 2020-05-15: 200 mg via INTRAVENOUS

## 2020-05-15 MED ORDER — HYDROCODONE-ACETAMINOPHEN 5-325 MG PO TABS
1.0000 | ORAL_TABLET | ORAL | 0 refills | Status: DC | PRN
Start: 2020-05-15 — End: 2021-06-25

## 2020-05-15 MED ORDER — BUPIVACAINE-EPINEPHRINE 0.5% -1:200000 IJ SOLN
INTRAMUSCULAR | Status: AC
  Filled 2020-05-15: qty 1

## 2020-05-15 MED ORDER — DEXAMETHASONE SODIUM PHOSPHATE 10 MG/ML IJ SOLN
INTRAMUSCULAR | Status: DC | PRN
  Administered 2020-05-15: 10 mg via INTRAVENOUS

## 2020-05-15 MED ORDER — METHOCARBAMOL 500 MG IVPB - SIMPLE MED
500.0000 mg | Freq: Four times a day (QID) | INTRAVENOUS | Status: DC | PRN
  Administered 2020-05-15: 500 mg via INTRAVENOUS
  Filled 2020-05-15: qty 500
  Filled 2020-05-15: qty 50

## 2020-05-15 MED ORDER — PHENYLEPHRINE 40 MCG/ML (10ML) SYRINGE FOR IV PUSH (FOR BLOOD PRESSURE SUPPORT)
PREFILLED_SYRINGE | INTRAVENOUS | Status: DC | PRN
  Administered 2020-05-15: 120 ug via INTRAVENOUS
  Administered 2020-05-15: 80 ug via INTRAVENOUS
  Administered 2020-05-15 (×3): 120 ug via INTRAVENOUS
  Administered 2020-05-15 (×5): 80 ug via INTRAVENOUS
  Administered 2020-05-15: 120 ug via INTRAVENOUS
  Administered 2020-05-15: 80 ug via INTRAVENOUS

## 2020-05-15 MED ORDER — PHENYLEPHRINE 40 MCG/ML (10ML) SYRINGE FOR IV PUSH (FOR BLOOD PRESSURE SUPPORT)
PREFILLED_SYRINGE | INTRAVENOUS | Status: AC
  Filled 2020-05-15: qty 20

## 2020-05-15 MED ORDER — KETOROLAC TROMETHAMINE 15 MG/ML IJ SOLN
7.5000 mg | Freq: Four times a day (QID) | INTRAMUSCULAR | Status: AC
  Administered 2020-05-15 – 2020-05-16 (×3): 7.5 mg via INTRAVENOUS
  Filled 2020-05-15 (×3): qty 1

## 2020-05-15 MED ORDER — DEXAMETHASONE SODIUM PHOSPHATE 10 MG/ML IJ SOLN
10.0000 mg | Freq: Once | INTRAMUSCULAR | Status: AC
  Administered 2020-05-16: 10 mg via INTRAVENOUS
  Filled 2020-05-15: qty 1

## 2020-05-15 MED ORDER — BACLOFEN 10 MG PO TABS: 10.0000 mg | ORAL_TABLET | Freq: Three times a day (TID) | ORAL | 0 refills | Status: DC

## 2020-05-15 SURGICAL SUPPLY — 70 items
APL SKNCLS STERI-STRIP NONHPOA (GAUZE/BANDAGES/DRESSINGS) ×1
BALL HIP ARTICU EZE 36 8.5 (Hips) IMPLANT
BENZOIN TINCTURE PRP APPL 2/3 (GAUZE/BANDAGES/DRESSINGS) ×2 IMPLANT
BIT DRILL 2.0X128 (BIT) ×1 IMPLANT
BRUSH FEMORAL CANAL (MISCELLANEOUS) IMPLANT
CLSR STERI-STRIP ANTIMIC 1/2X4 (GAUZE/BANDAGES/DRESSINGS) ×4 IMPLANT
COVER SURGICAL LIGHT HANDLE (MISCELLANEOUS) ×2 IMPLANT
COVER WAND RF STERILE (DRAPES) IMPLANT
DRAPE INCISE IOBAN 66X45 STRL (DRAPES) ×2 IMPLANT
DRAPE ORTHO SPLIT 77X108 STRL (DRAPES) ×4
DRAPE POUCH INSTRU U-SHP 10X18 (DRAPES) ×2 IMPLANT
DRAPE SHEET LG 3/4 BI-LAMINATE (DRAPES) ×2 IMPLANT
DRAPE SURG ORHT 6 SPLT 77X108 (DRAPES) ×2 IMPLANT
DRAPE U-SHAPE 47X51 STRL (DRAPES) ×2 IMPLANT
DRESSING PREVENA PLUS CUSTOM (GAUZE/BANDAGES/DRESSINGS) IMPLANT
DRSG PAD ABDOMINAL 8X10 ST (GAUZE/BANDAGES/DRESSINGS) IMPLANT
DRSG PREVENA PLUS CUSTOM (GAUZE/BANDAGES/DRESSINGS) ×2
DURAPREP 26ML APPLICATOR (WOUND CARE) ×4 IMPLANT
ELECT BLADE TIP CTD 4 INCH (ELECTRODE) ×2 IMPLANT
ELECT REM PT RETURN 15FT ADLT (MISCELLANEOUS) ×2 IMPLANT
EVACUATOR 1/8 PVC DRAIN (DRAIN) ×2 IMPLANT
GAUZE SPONGE 4X4 12PLY STRL (GAUZE/BANDAGES/DRESSINGS) ×4 IMPLANT
GAUZE XEROFORM 5X9 LF (GAUZE/BANDAGES/DRESSINGS) ×2 IMPLANT
GLOVE BIO SURGEON STRL SZ7.5 (GLOVE) ×2 IMPLANT
GLOVE BIO SURGEON STRL SZ8 (GLOVE) ×2 IMPLANT
GLOVE BIOGEL PI IND STRL 8 (GLOVE) ×2 IMPLANT
GLOVE BIOGEL PI INDICATOR 8 (GLOVE) ×2
GOWN STRL REUS W/TWL LRG LVL3 (GOWN DISPOSABLE) ×2 IMPLANT
GOWN STRL REUS W/TWL XL LVL3 (GOWN DISPOSABLE) ×2 IMPLANT
HANDPIECE INTERPULSE COAX TIP (DISPOSABLE)
HIP BALL ARTICU EZE 36 8.5 (Hips) ×2 IMPLANT
HOOD PEEL AWAY FLYTE STAYCOOL (MISCELLANEOUS) ×8 IMPLANT
KIT BASIN OR (CUSTOM PROCEDURE TRAY) ×2 IMPLANT
KIT DRSG PREVENA PLUS 7DAY 125 (MISCELLANEOUS) IMPLANT
KIT TURNOVER KIT A (KITS) ×1 IMPLANT
LINER NEUTRAL 52X36MM PLUS 4 (Liner) ×1 IMPLANT
MANIFOLD NEPTUNE II (INSTRUMENTS) ×2 IMPLANT
NDL HYPO 25X1 1.5 SAFETY (NEEDLE) ×1 IMPLANT
NEEDLE HYPO 22GX1.5 SAFETY (NEEDLE) IMPLANT
NEEDLE HYPO 25X1 1.5 SAFETY (NEEDLE) ×2 IMPLANT
NS IRRIG 1000ML POUR BTL (IV SOLUTION) ×2 IMPLANT
PACK TOTAL JOINT (CUSTOM PROCEDURE TRAY) ×2 IMPLANT
PASSER SUT SWANSON 36MM LOOP (INSTRUMENTS) ×3 IMPLANT
PENCIL SMOKE EVACUATOR (MISCELLANEOUS) IMPLANT
PILLOW ABDUCTION MEDIUM (MISCELLANEOUS) ×1 IMPLANT
PROTECTOR NERVE ULNAR (MISCELLANEOUS) ×2 IMPLANT
RETRIEVER SUT HEWSON (MISCELLANEOUS) ×1 IMPLANT
SET HNDPC FAN SPRY TIP SCT (DISPOSABLE) IMPLANT
SPONGE LAP 18X18 RF (DISPOSABLE) IMPLANT
SPONGE LAP 4X18 RFD (DISPOSABLE) IMPLANT
STAPLER VISISTAT 35W (STAPLE) ×2 IMPLANT
SUCTION FRAZIER HANDLE 12FR (TUBING) ×2
SUCTION TUBE FRAZIER 12FR DISP (TUBING) ×1 IMPLANT
SUT ETHIBOND NAB CT1 #1 30IN (SUTURE) IMPLANT
SUT ETHILON 2 0 PS N (SUTURE) ×3 IMPLANT
SUT PROLENE 1 CT 1 30 (SUTURE) ×5 IMPLANT
SUT PROLENE 2 0 CT2 30 (SUTURE) ×1 IMPLANT
SUT PROLENE 2 0 SH DA (SUTURE) ×1 IMPLANT
SUT VIC AB 1 CT1 27 (SUTURE) ×2
SUT VIC AB 1 CT1 27XBRD ANTBC (SUTURE) ×1 IMPLANT
SUT VIC AB 1 CTX 36 (SUTURE)
SUT VIC AB 1 CTX36XBRD ANBCTR (SUTURE) IMPLANT
SUT VIC AB 2-0 FS1 27 (SUTURE) IMPLANT
SWAB CULTURE ESWAB REG 1ML (MISCELLANEOUS) IMPLANT
SYR 30ML LL (SYRINGE) IMPLANT
TOWEL OR 17X26 10 PK STRL BLUE (TOWEL DISPOSABLE) ×4 IMPLANT
TOWEL OR NON WOVEN STRL DISP B (DISPOSABLE) ×2 IMPLANT
TRAY FOLEY MTR SLVR 16FR STAT (SET/KITS/TRAYS/PACK) ×2 IMPLANT
WATER STERILE IRR 1000ML POUR (IV SOLUTION) ×4 IMPLANT
YANKAUER SUCT BULB TIP 10FT TU (MISCELLANEOUS) ×2 IMPLANT

## 2020-05-15 NOTE — Anesthesia Procedure Notes (Signed)
Date/Time: 05/15/2020 6:42 PM Performed by: Cynda Familia, CRNA Pre-anesthesia Checklist: Patient identified Oxygen Delivery Method: Simple face mask Placement Confirmation: positive ETCO2 and breath sounds checked- equal and bilateral Dental Injury: Teeth and Oropharynx as per pre-operative assessment

## 2020-05-15 NOTE — Progress Notes (Signed)
Pharmacy Antibiotic Note  Regina Berry is a 69 y.o. female admitted on 05/13/2020 with infected left prosthetic hip joint s/p I&D, poly exchange and wound vac placement 9/8.  Pharmacy has been consulted for Zosyn dosing. ID following.  Day #3 antibiotics - Afebrile - WBC 12 (9/9), improved - SCr 0.81, CrCl 72 ml/min  Plan:  Zosyn every 8 hrs by 4-hr infusion - no dose adjustments needed, pharmacy will sign off  Vancomycin changed to Daptomycin 8mg /kg q24h per ID   Height: 5\' 3"  (160 cm) Weight: 93 kg (205 lb) IBW/kg (Calculated) : 52.4  Temp (24hrs), Avg:97.7 F (36.5 C), Min:97.5 F (36.4 C), Max:97.9 F (36.6 C)  Recent Labs  Lab 05/13/20 1630 05/14/20 0323 05/15/20 0350  WBC 14.3* 12.0*  --   CREATININE  --  0.69 0.81    Estimated Creatinine Clearance: 72 mL/min (by C-G formula based on SCr of 0.81 mg/dL).    Allergies  Allergen Reactions  . Codeine Itching   Antimicrobials this admission: 9/8 Vanc >>  9/8 Zosyn >> 9/9 9/9 Dapto >  Dose adjustments this admission: n/a  Microbiology results: 9/8 L hip: abundant GNR 9/10 MRSA PCR: neg  Thank you for allowing pharmacy to be a part of this patient's care.  Peggyann Juba, PharmD, BCPS Pharmacy: [phone] 05/15/2020 8:13 AM

## 2020-05-15 NOTE — Progress Notes (Signed)
Orthopedic Tech Progress Note Patient Details:  Regina Berry [birth year] [id number]  Ortho Devices Ortho Device/Splint Location: applied overhead frame to bed Ortho Device/Splint Interventions: Ordered, Application   Post Interventions Patient Tolerated: Well Instructions Provided: Care of device   Braulio Bosch 05/15/2020, 9:14 PM

## 2020-05-15 NOTE — Progress Notes (Signed)
      INFECTIOUS DISEASE ATTENDING:   Date: 05/15/2020  Patient name: Regina Berry  Medical record number: [id number]  Date of birth: 06/24/1951   Jimmy Footman alerted me that the culture from joint grew a pan sensitive E coli  There apparently may also be a beta hemolytic streptococcal species present.  Dr. Johnnye Sima will followup culture data further over the weekend.    Alcide Evener 05/15/2020, 3:37 PM

## 2020-05-15 NOTE — Discharge Instructions (Signed)
Diet: As you were doing prior to hospitalization   Shower:  No showering until wound vac is removed. Can do bird baths at home with a washcloth or sponge.   Dressing:  Keep dressing in place until follow up in our office.    You can weight bear as tolerated on your left leg.   To prevent constipation: you may use a stool softener such as -  Colace (over the counter) 100 mg by mouth twice a day  Drink plenty of fluids (prune juice may be helpful) and high fiber foods Miralax (over the counter) for constipation as needed.    Itching:  If you experience itching with your medications, try taking only a single pain pill, or even half a pain pill at a time.  You may take up to 10 pain pills per day, and you can also use benadryl over the counter for itching or also to help with sleep.   Precautions:  If you experience chest pain or shortness of breath - call 911 immediately for transfer to the hospital emergency department!!  If you develop a fever greater that 101 F, purulent drainage from wound, increased redness or drainage from wound, or calf pain -- Call the office at [phone]                                                 Do not remove wound vac or drains. These will stay in place until follow-up in our office. Wound vac tubing needs to be plugged into the device at all times Please refer to [place] guide should you have any questions or alerts on your device come up. If unable to troubleshoot, please call our office with questions.

## 2020-05-15 NOTE — Progress Notes (Addendum)
PHARMACY CONSULT NOTE FOR:  OUTPATIENT  PARENTERAL ANTIBIOTIC THERAPY (OPAT)  Indication: Prosthetic hip infection  Regimen: Ceftriaxone 2 gm IV Q 24 hours End date: 06/26/2020  Tentative OPAT orders based on Culture results of E coli and a potential beta-hemolytic gm positive organism - F/U culture data and adjust as needed.   IV antibiotic discharge orders are pended. To discharging provider:  please sign these orders via discharge navigator,  Select New Orders & click on the button choice - Manage This Unsigned Work.     Thank you for allowing pharmacy to be a part of this patient's care.  Jimmy Footman, PharmD, BCPS, BCIDP Infectious Diseases Clinical Pharmacist Phone: [phone] 05/15/2020, 2:35 PM

## 2020-05-15 NOTE — Progress Notes (Signed)
Physical Therapy Treatment Patient Details Name: Regina Berry MRN: [id number] DOB: 1951-02-23 Today's Date: 05/15/2020    History of Present Illness Patient is 69 y.o. female s/p I&D of L THR (Lt THA posterolateral approach on 03/17/20) with PMH significant for HTN, COPD, GERD, OA, anxiety, hypothyroidism.    PT Comments    Pt continues to mobilize well.  Pt for second I&D of L hip this pm.    Follow Up Recommendations  Follow surgeon's recommendation for DC plan and follow-up therapies     Equipment Recommendations  None recommended by PT    Recommendations for Other Services       Precautions / Restrictions Precautions Precautions: Fall;Posterior Hip Precaution Comments: Pt able to recall 3/3 hip precautions. She still requires intermittent cues to put them into practice Restrictions Weight Bearing Restrictions: No Other Position/Activity Restrictions: WBAT    Mobility  Bed Mobility Overal bed mobility: Needs Assistance Bed Mobility: Supine to Sit     Supine to sit: Supervision     General bed mobility comments: Increased time with min cues for sequence and adherence to THP  Transfers Overall transfer level: Needs assistance Equipment used: Rolling walker (2 wheeled) Transfers: Sit to/from Stand Sit to Stand: Supervision         General transfer comment: min cues for LE management and for use of UEs to self assist  Ambulation/Gait Ambulation/Gait assistance: Min guard;Supervision Gait Distance (Feet): 400 Feet Assistive device: Rolling walker (2 wheeled) Gait Pattern/deviations: Step-to pattern;Step-through pattern;Decreased stride length Gait velocity: decr   General Gait Details: Min guard for safety.  Min cues for posture and position from [employer] Mobility    Modified Rankin (Stroke Patients Only)       Balance Overall balance assessment: Needs assistance Sitting-balance support: Feet supported Sitting  balance-Leahy Scale: Good     Standing balance support: No upper extremity supported Standing balance-Leahy Scale: Fair                              Cognition Arousal/Alertness: Awake/alert Behavior During Therapy: WFL for tasks assessed/performed Overall Cognitive Status: Within Functional Limits for tasks assessed                                        Exercises Total Joint Exercises Ankle Circles/Pumps: AROM;Both;10 reps    General Comments        Pertinent Vitals/Pain Pain Assessment: 0-10 Pain Score: 3  Pain Location: L hip Pain Descriptors / Indicators: Discomfort;Sore;Aching Pain Intervention(s): Limited activity within patient's tolerance;Monitored during session;Premedicated before session    Home Living                      Prior Function            PT Goals (current goals can now be found in the care plan section) Acute Rehab PT Goals Patient Stated Goal: recover with therapy and get back to independence PT Goal Formulation: With patient Time For Goal Achievement: 03/24/20 Potential to Achieve Goals: Good Progress towards PT goals: Progressing toward goals    Frequency    7X/week      PT Plan Current plan remains appropriate    Co-evaluation  AM-PAC PT "6 Clicks" Mobility   Outcome Measure  Help needed turning from your back to your side while in a flat bed without using bedrails?: A Little Help needed moving from lying on your back to sitting on the side of a flat bed without using bedrails?: A Little Help needed moving to and from a bed to a chair (including a wheelchair)?: A Little Help needed standing up from a chair using your arms (e.g., wheelchair or bedside chair)?: A Little Help needed to walk in hospital room?: A Little Help needed climbing 3-5 steps with a railing? : A Little 6 Click Score: 18    End of Session Equipment Utilized During Treatment: Gait belt Activity  Tolerance: Patient tolerated treatment well Patient left: with call bell/phone within reach;in chair Nurse Communication: Mobility status PT Visit Diagnosis: Muscle weakness (generalized) (M62.81);Difficulty in walking, not elsewhere classified (R26.2)     Time: 6811-5726 PT Time Calculation (min) (ACUTE ONLY): 22 min  Charges:  $Gait Training: 8-22 mins                     [place] Pager [phone] Office [phone]    Clotilda Hafer 05/15/2020, 10:22 AM

## 2020-05-15 NOTE — Op Note (Signed)
05/13/2020 - 05/15/2020  5:40 PM  PATIENT:  Regina Berry    PRE-OPERATIVE DIAGNOSIS: Left prosthetic hip infection  POST-OPERATIVE DIAGNOSIS:  Same  PROCEDURE:    1.  Excisional debridement, skin, subcutaneous tissue, fascia, down to bone and joint, left prosthetic hip infection using a scalpel, rongeur, measuring approximately 15 cm in length, 8 cm in width, 10 cm in depth.  2.  Revision left total hip replacement, with exchange of polyethylene liner and metallic femoral head 3.  Placement of incisional wound VAC, 15 cm  SURGEON:  Johnny Bridge, MD  PHYSICIAN ASSISTANT: Merlene Pulling, PA-C, present and scrubbed throughout the case, critical for completion in a timely fashion, and for retraction, instrumentation, and closure.  ANESTHESIA:   General  PREOPERATIVE INDICATIONS:  Regina Berry is a  69 y.o. female had a prosthetic left hip infection, and underwent surgical irrigation and debridement wash earlier this week, had 1 L of purulence present, and elected to undergo repeat I&D, with polyethylene exchange.  The risks benefits and alternatives were discussed with the patient preoperatively including but not limited to the risks of persistent infection, bleeding, nerve injury, cardiopulmonary complications, the need for revision surgery, among others, and the patient was willing to proceed.  ESTIMATED BLOOD LOSS: 150 mL  OPERATIVE IMPLANTS: I exchanged the femoral head for a fresh femoral head that was 8.5 mm in neck length, by 36 mm.  The polyethylene liner was 52 mm with a +4 neutral lining.  OPERATIVE FINDINGS: The wound was relatively clean, without significant recurrent purulence.  The acetabulum and the femoral component were stable.  OPERATIVE PROCEDURE: The patient was brought to the operating room and placed in the supine position.  She was on IV Zosyn and Rocephin.  Her preoperative cultures have so far grown out E. coli.  She was turned into the lateral decubitus  position and all bony prominences padded.  The wound VAC was removed.  Left lower extremity was prepped and draped in the usual sterile fashion.  A Foley was also administered.  This was prior to prepping.  I then exposed the wound, cleaned all of the tissue using a scalpel, rongeur, and curettes, remove the femoral head, exposed the acetabulum, used a curette into the remaining screw holes, and then used irrisept through a total of 2 rounds, waiting 5 minutes for each wound, irrigating 3 L of fluid before and after each round.  Ultimately I had excellent cleaning of the wound and the prosthesis with no visible purulence.  I then replaced the polyethylene, had excellent seating of the polyethylene circumferentially.  I then replaced the femoral head.  The hip was reduced, the capsule was tagged with #1 Prolene, and then brought through a drill hole in the greater trochanter.  The repair was satisfactory, the superior tissue was reasonably good, the inferior tissue is not as good but at least somewhat present.  I placed a total of 2 Hemovac drains, one deep, one superficial, and closed the IT band with #1 Prolene, followed by interrupted Prolene for the fat layer, with nylon for the skin.  Sterile gauze was applied, as well as a wound VAC on the closed incision.  She was turned supine, awakened, and returned to the PACU in stable and satisfactory condition.  There were no complications and she tolerated the procedure well.

## 2020-05-15 NOTE — Progress Notes (Signed)
Subjective: No new complaints   Antibiotics:  Anti-infectives (From admission, onward)   Start     Dose/Rate Route Frequency Ordered Stop   05/14/20 2200  vancomycin (VANCOREADY) IVPB 1250 mg/250 mL  Status:  Discontinued        1,250 mg 166.7 mL/hr over 90 Minutes Intravenous Every 12 hours 05/14/20 0852 05/14/20 1021   05/14/20 1600  DAPTOmycin (CUBICIN) 750 mg in sodium chloride 0.9 % IVPB        750 mg 230 mL/hr over 30 Minutes Intravenous Daily 05/14/20 1021     05/14/20 0200  piperacillin-tazobactam (ZOSYN) IVPB 3.375 g        3.375 g 12.5 mL/hr over 240 Minutes Intravenous Every 8 hours 05/13/20 2148     05/13/20 2200  vancomycin (VANCOREADY) IVPB 750 mg/150 mL        750 mg 150 mL/hr over 60 Minutes Intravenous  Once 05/13/20 2148 05/14/20 0038   05/13/20 1903  vancomycin (VANCOCIN) powder  Status:  Discontinued          As needed 05/13/20 1903 05/13/20 2054   05/13/20 1830  piperacillin-tazobactam (ZOSYN) IVPB 3.375 g        3.375 g 100 mL/hr over 30 Minutes Intravenous  Once 05/13/20 1815 05/13/20 1849   05/13/20 1810  vancomycin (VANCOCIN) 1-5 GM/200ML-% IVPB       Note to Pharmacy: Georgena Spurling   : cabinet override      05/13/20 1810 05/14/20 0614   05/13/20 1810  piperacillin-tazobactam (ZOSYN) 3.375 GM/50ML IVPB       Note to Pharmacy: Georgena Spurling   : cabinet override      05/13/20 1810 05/13/20 1820      Medications: Scheduled Meds: . buPROPion  300 mg Oral Daily  . Chlorhexidine Gluconate Cloth  6 each Topical Daily  . citalopram  40 mg Oral QHS  . docusate sodium  100 mg Oral BID  . enoxaparin (LOVENOX) injection  40 mg Subcutaneous Q24H  . losartan  100 mg Oral Daily   And  . hydrochlorothiazide  25 mg Oral Daily  . levothyroxine  88 mcg Oral QAC breakfast  . loratadine  10 mg Oral QHS  . potassium chloride SA  20 mEq Oral Daily  . vitamin B-12  1,000 mcg Oral Daily   Continuous Infusions: . 0.45 % NaCl with KCl 20 mEq / L 75 mL/hr at  05/15/20 0113  . DAPTOmycin (CUBICIN)  IV 750 mg (05/14/20 1642)  . lactated ringers 50 mL/hr at 05/13/20 1725  . methocarbamol (ROBAXIN) IV 500 mg (05/13/20 2021)  . piperacillin-tazobactam (ZOSYN)  IV 3.375 g (05/15/20 0934)   PRN Meds:.acetaminophen, alum & mag hydroxide-simeth, bisacodyl, diphenhydrAMINE, fluticasone, furosemide, HYDROcodone-acetaminophen, HYDROcodone-acetaminophen, magnesium citrate, menthol-cetylpyridinium **OR** phenol, methocarbamol **OR** methocarbamol (ROBAXIN) IV, metoCLOPramide **OR** metoCLOPramide (REGLAN) injection, morphine injection, ondansetron **OR** ondansetron (ZOFRAN) IV, polyethylene glycol, sodium chloride flush, zolpidem    Objective: Weight change:   Intake/Output Summary (Last 24 hours) at 05/15/2020 1223 Last data filed at 05/15/2020 0955 Gross per 24 hour  Intake 1898.68 ml  Output 1500 ml  Net 398.68 ml   Blood pressure (!) 115/56, pulse 90, temperature 97.8 F (36.6 C), resp. rate 15, height _0  (1.6 m), weight 93 kg, SpO2 97 %. Temp:  [97.6 F (36.4 C)-97.9 F (36.6 C)] 97.8 F (36.6 C) (09/10 0529) Pulse Rate:  [84-93] 90 (09/10 0529) Resp:  [15-17] 15 (09/10 0529) BP: (108-127)/(56-65) 115/56 (09/10 0529) SpO2:  [  96 %-99 %] 97 % (09/10 0529)  Physical Exam: General: Alert and awake, oriented x3, not in any acute distress. HEENT: anicteric sclera, EOMI CVS regular rate, normal  Chest: , no wheezing, no respiratory distress Abdomen: soft non-distended,  Extremities: dressing in place Skin: PICC in place Neuro: nonfocal  CBC:    BMET Recent Labs    05/14/20 0323 05/15/20 0350  NA 137  --   K 4.6  --   CL 102  --   CO2 27  --   GLUCOSE 188*  --   BUN 17  --   CREATININE 0.69 0.81  CALCIUM 8.9  --      Liver Panel  No results for input(s): PROT, ALBUMIN, AST, ALT, ALKPHOS, BILITOT, BILIDIR, IBILI in the last 72 hours.     Sedimentation Rate Recent Labs    05/15/20 0350  ESRSEDRATE 72*   C-Reactive  Protein Recent Labs    05/15/20 0350  CRP 15.0*    Micro Results: Recent Results (from the past 720 hour(s))  Aerobic/Anaerobic Culture (surgical/deep wound)     Status: None   Collection Time: 05/07/20 12:00 PM   Specimen: PATH Cytology FNA; Body Fluid  Result Value Ref Range Status   Specimen Description CYTO FNA  Final   Special Requests NONE  Final   Gram Stain   Final    RARE WBC PRESENT,BOTH PMN AND MONONUCLEAR NO ORGANISMS SEEN    Culture   Final    No growth aerobically or anaerobically. Performed at [facility], [place] [address] [place], [place] [address]    Report Status 05/12/2020 FINAL  Final  SARS Coronavirus 2 by RT PCR (hospital order, performed in [facility] hospital lab) Nasopharyngeal Nasopharyngeal Swab     Status: None   Collection Time: 05/13/20  3:28 PM   Specimen: Nasopharyngeal Swab  Result Value Ref Range Status   SARS Coronavirus 2 NEGATIVE NEGATIVE Final    Comment: (NOTE) SARS-CoV-2 target nucleic acids are NOT DETECTED.  The SARS-CoV-2 RNA is generally detectable in upper and lower respiratory specimens during the acute phase of infection. The lowest concentration of SARS-CoV-2 viral copies this assay can detect is 250 copies / mL. A negative result does not preclude SARS-CoV-2 infection and should not be used as the sole basis for treatment or other patient management decisions.  A negative result may occur with improper specimen collection / handling, submission of specimen other than nasopharyngeal swab, presence of viral mutation(s) within the areas targeted by this assay, and inadequate number of viral copies (<250 copies / mL). A negative result must be combined with clinical observations, patient history, and epidemiological information.  Fact Sheet for Patients:   StrictlyIdeas.no  Fact Sheet for Healthcare Providers: BankingDealers.co.za  This test is not yet approved or   cleared by the [place] FDA and has been authorized for detection and/or diagnosis of SARS-CoV-2 by FDA under an Emergency Use Authorization (EUA).  This EUA will remain in effect (meaning this test can be used) for the duration of the COVID-19 declaration under Section 564(b)(1) of the Act, 21 U.S.C. section 360bbb-3(b)(1), unless the authorization is terminated or revoked sooner.  Performed at [facility], [place] [address] [place], [place] [address]   Aerobic/Anaerobic Culture (surgical/deep wound)     Status: None (Preliminary result)   Collection Time: 05/13/20  6:11 PM   Specimen: Other Source; Body Fluid  Result Value Ref Range Status   Specimen Description  Final    HIP LEFT DEEP Performed at [facility], [place] [address] [place], [place] [address]    Special Requests   Final    NONE Performed at [facility], [place] [address], [place], [place] [address]    Gram Stain   Final    ABUNDANT WBC PRESENT, PREDOMINANTLY PMN ABUNDANT GRAM POSITIVE RODS MODERATE GRAM NEGATIVE RODS RARE GRAM POSITIVE COCCI    Culture   Final    ABUNDANT GRAM NEGATIVE RODS IDENTIFICATION AND SUSCEPTIBILITIES TO FOLLOW Performed at [facility], [place] [address] [place], [place] [address]    Report Status PENDING  Incomplete  MRSA PCR Screening     Status: None   Collection Time: 05/15/20  1:18 AM   Specimen: Nasopharyngeal  Result Value Ref Range Status   MRSA by PCR NEGATIVE NEGATIVE Final    Comment:        The GeneXpert MRSA Assay (FDA approved for NASAL specimens only), is one component of a comprehensive MRSA colonization surveillance program. It is not intended to diagnose MRSA infection nor to guide or monitor treatment for MRSA infections. Performed at [facility], [place] [address] [place], [place] [address]     Studies/Results: DG Pelvis Portable  Result Date: 05/13/2020 CLINICAL DATA:   Left hip arthroplasty EXAM: PORTABLE PELVIS 1-2 VIEWS; DG HIP (WITH OR WITHOUT PELVIS) 1V PORT LEFT COMPARISON:  03/17/2020 FINDINGS: Pelvis: Frontal view of the lower pelvis including both hips was performed. Iliac crests and sacrum are excluded by collimation. Left hip arthroplasty is identified in stable position with no evidence of complication. No acute displaced fracture. Left hip: 3 cross-table lateral views of the left hip are obtained. Left hip arthroplasty is in expected position with no signs of complication. No evidence of fracture. IMPRESSION: 1. Unremarkable left hip arthroplasty. Electronically Signed   By: Randa Ngo M.D.   On: 05/13/2020 20:46   DG Hip Port Unilat With Pelvis 1V Left  Result Date: 05/13/2020 CLINICAL DATA:  Left hip arthroplasty EXAM: PORTABLE PELVIS 1-2 VIEWS; DG HIP (WITH OR WITHOUT PELVIS) 1V PORT LEFT COMPARISON:  03/17/2020 FINDINGS: Pelvis: Frontal view of the lower pelvis including both hips was performed. Iliac crests and sacrum are excluded by collimation. Left hip arthroplasty is identified in stable position with no evidence of complication. No acute displaced fracture. Left hip: 3 cross-table lateral views of the left hip are obtained. Left hip arthroplasty is in expected position with no signs of complication. No evidence of fracture. IMPRESSION: 1. Unremarkable left hip arthroplasty. Electronically Signed   By: Randa Ngo M.D.   On: 05/13/2020 20:46   [place] EKG SITE RITE  Result Date: 05/13/2020 If Site Rite image not attached, placement could not be confirmed due to current cardiac rhythm.     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 GS had shown GNR, GPR and GPC but cultures so far only  growing GNR (likely an E coli)   #1 PJI:  Continue zosyn and daptomicin for now though we should be able to narrow soon  She is going for repeat surgery today  She will need 6 weeks of IV therapy followed by oral therapy to complete minimum of  6 months of therapy with close monitoring of pain ESR,CRP  Dr. Johnnye Sima will check on her culture data over the weekend and adjust her antibiotics.  He will see her on Monday.   LOS: 2 days   Alcide Evener 05/15/2020, 12:23 PM

## 2020-05-15 NOTE — Transfer of Care (Signed)
Immediate Anesthesia Transfer of Care Note  Patient: Regina Berry  Procedure(s) Performed: IRRIGATION AND DEBRIDEMENT HIP LEFT HIP, POSSIBLE POLLY EXCHANGE (Left ) APPLICATION OF WOUND VAC (Left )  Patient Location: PACU  Anesthesia Type:General  Level of Consciousness: awake and alert   Airway & Oxygen Therapy: Patient Spontanous Breathing and Patient connected to face mask oxygen  Post-op Assessment: Report given to RN and Post -op Vital signs reviewed and stable  Post vital signs: Reviewed and stable  Last Vitals:  Vitals Value Taken Time  BP 140/65 05/15/20 1856  Temp    Pulse 83 05/15/20 1900  Resp 12 05/15/20 1900  SpO2 100 % 05/15/20 1900  Vitals shown include unvalidated device data.  Last Pain:  Vitals:   05/15/20 1400  TempSrc: Oral  PainSc:       Patients Stated Pain Goal: 2 (65/99/35 7017)  Complications: No complications documented.

## 2020-05-15 NOTE — Anesthesia Preprocedure Evaluation (Addendum)
Anesthesia Evaluation  Patient identified by MRN, date of birth, ID band Patient awake    Reviewed: Allergy & Precautions, NPO status , Patient's Chart, lab work & pertinent test results  History of Anesthesia Complications Negative for: history of anesthetic complications  Airway Mallampati: III  TM Distance: >3 FB Neck ROM: Full    Dental no notable dental hx. (+) Dental Advisory Given   Pulmonary sleep apnea , COPD, Current Smoker and Patient abstained from smoking.,    Pulmonary exam normal        Cardiovascular hypertension, Pt. on medications Normal cardiovascular exam     Neuro/Psych negative neurological ROS     GI/Hepatic Neg liver ROS, GERD  ,  Endo/Other  Hypothyroidism   Renal/GU negative Renal ROS     Musculoskeletal  (+) Arthritis ,   Abdominal   Peds  Hematology negative hematology ROS (+)   Anesthesia Other Findings   Reproductive/Obstetrics                            Lab Results  Component Value Date   WBC 20.1 (H) 05/15/2020   HGB 8.7 (L) 05/15/2020   HCT 27.3 (L) 05/15/2020   MCV 88.1 05/15/2020   PLT 366 05/15/2020   Lab Results  Component Value Date   CREATININE 0.81 05/15/2020   BUN 17 05/14/2020   NA 137 05/14/2020   K 4.6 05/14/2020   CL 102 05/14/2020   CO2 27 05/14/2020    Anesthesia Physical  Anesthesia Plan  ASA: III  Anesthesia Plan: General   Post-op Pain Management:    Induction: Intravenous  PONV Risk Score and Plan: 3 and Dexamethasone, Ondansetron and Treatment may vary due to age or medical condition  Airway Management Planned: LMA  Additional Equipment:   Intra-op Plan:   Post-operative Plan: Extubation in OR  Informed Consent: I have reviewed the patients History and Physical, chart, labs and discussed the procedure including the risks, benefits and alternatives for the proposed anesthesia with the patient or authorized  representative who has indicated his/her understanding and acceptance.     Dental advisory given  Plan Discussed with: Anesthesiologist and CRNA  Anesthesia Plan Comments:        Anesthesia Quick Evaluation

## 2020-05-15 NOTE — Anesthesia Procedure Notes (Signed)
Procedure Name: Intubation Performed by: Mikeya Tomasetti J, CRNA Pre-anesthesia Checklist: Patient identified, Emergency Drugs available, Suction available, Patient being monitored and Timeout performed Patient Re-evaluated:Patient Re-evaluated prior to induction Oxygen Delivery Method: Circle system utilized Preoxygenation: Pre-oxygenation with 100% oxygen Induction Type: IV induction Ventilation: Mask ventilation without difficulty Laryngoscope Size: Mac and 3 Grade View: Grade II Tube type: Oral Tube size: 7.0 mm Number of attempts: 1 Airway Equipment and Method: Stylet Placement Confirmation: ETT inserted through vocal cords under direct vision,  positive ETCO2 and breath sounds checked- equal and bilateral Secured at: 21 cm Tube secured with: Tape Dental Injury: Teeth and Oropharynx as per pre-operative assessment        

## 2020-05-15 NOTE — Progress Notes (Signed)
Patient feels much better today than the other day.  Wound VAC has filled up almost 2/3 of the way.  EHL is intact.  Plan for repeat I&D, left hip, with polyethylene and femoral head exchange, likely closed over drains, I may place an incisional wound VAC, and plan for discharge home with IV antibiotics per infectious disease.  She already has a PICC line.  The risks benefits and alternatives were discussed with the patient including but not limited to the risks of nonoperative treatment, versus surgical intervention including persistent infection, bleeding, nerve injury, periprosthetic fracture, the need for revision surgery, dislocation, leg length discrepancy, blood clots, cardiopulmonary complications, morbidity, mortality, among others, and they were willing to proceed.    Johnny Bridge, MD

## 2020-05-16 LAB — BASIC METABOLIC PANEL
Anion gap: 9 (ref 5–15)
BUN: 16 mg/dL (ref 8–23)
CO2: 29 mmol/L (ref 22–32)
Calcium: 8.7 mg/dL — ABNORMAL LOW (ref 8.9–10.3)
Chloride: 104 mmol/L (ref 98–111)
Creatinine, Ser: 0.73 mg/dL (ref 0.44–1.00)
GFR calc Af Amer: >60 mL/min (ref >60)
GFR calc non Af Amer: >60 mL/min (ref >60)
Glucose, Bld: 203 mg/dL — ABNORMAL HIGH (ref 70–99)
Potassium: 4.2 mmol/L (ref 3.5–5.1)
Sodium: 142 mmol/L (ref 135–145)

## 2020-05-16 LAB — CBC
HCT: 23.1 % — ABNORMAL LOW (ref 36.0–46.0)
Hemoglobin: 7.3 g/dL — ABNORMAL LOW (ref 12.0–15.0)
MCH: 28.2 pg (ref 26.0–34.0)
MCHC: 31.6 g/dL (ref 30.0–36.0)
MCV: 89.2 fL (ref 80.0–100.0)
Platelets: 287 10*3/uL (ref 150–400)
RBC: 2.59 MIL/uL — ABNORMAL LOW (ref 3.87–5.11)
RDW: 15.1 % (ref 11.5–15.5)
WBC: 11.5 10*3/uL — ABNORMAL HIGH (ref 4.0–10.5)
nRBC: 0 % (ref 0.0–0.2)

## 2020-05-16 LAB — HEPATITIS B SURFACE ANTIBODY, QUANTITATIVE: Hep B S AB Quant (Post): <3.1 m[IU]/mL — ABNORMAL LOW (ref >9.9)

## 2020-05-16 MED ORDER — SODIUM CHLORIDE 0.9% IV SOLUTION: Freq: Once | INTRAVENOUS | Status: AC

## 2020-05-16 MED ORDER — DIPHENHYDRAMINE HCL 25 MG PO CAPS
25.0000 mg | ORAL_CAPSULE | Freq: Once | ORAL | Status: AC
  Administered 2020-05-17: 25 mg via ORAL
  Filled 2020-05-16: qty 1

## 2020-05-16 MED ORDER — ACETAMINOPHEN 325 MG PO TABS
650.0000 mg | ORAL_TABLET | Freq: Once | ORAL | Status: AC
  Administered 2020-05-17: 650 mg via ORAL
  Filled 2020-05-16: qty 2

## 2020-05-16 MED ORDER — FUROSEMIDE 10 MG/ML IJ SOLN
20.0000 mg | Freq: Once | INTRAMUSCULAR | Status: AC
  Administered 2020-05-16: 20 mg via INTRAVENOUS
  Filled 2020-05-16: qty 2

## 2020-05-16 NOTE — Anesthesia Postprocedure Evaluation (Signed)
Anesthesia Post Note  Patient: Regina Berry  Procedure(s) Performed: IRRIGATION AND DEBRIDEMENT HIP LEFT HIP, POSSIBLE POLLY EXCHANGE (Left ) APPLICATION OF WOUND VAC (Left )     Patient location during evaluation: PACU Anesthesia Type: General Level of consciousness: sedated Pain management: pain level controlled Vital Signs Assessment: post-procedure vital signs reviewed and stable Respiratory status: spontaneous breathing and respiratory function stable Cardiovascular status: stable Postop Assessment: no apparent nausea or vomiting Anesthetic complications: no   No complications documented.               Davell Beckstead DANIEL

## 2020-05-16 NOTE — Plan of Care (Signed)
  Problem: Activity: Goal: Risk for activity intolerance will decrease 05/16/2020 0900 by Olen Cordial, RN Outcome: Progressing 05/16/2020 0857 by Olen Cordial, RN Outcome: Progressing   Problem: Pain Managment: Goal: General experience of comfort will improve 05/16/2020 0900 by Olen Cordial, RN Outcome: Progressing 05/16/2020 0857 by Olen Cordial, RN Outcome: Progressing

## 2020-05-16 NOTE — Anesthesia Postprocedure Evaluation (Signed)
Anesthesia Post Note  Patient: Regina Berry  Procedure(s) Performed: TOTAL HIP ARTHROPLASTY IRRIGATION AND DEBRIDEMENT, POLY EXCHANGE (Left Hip)     Patient location during evaluation: PACU Anesthesia Type: General Level of consciousness: awake and alert Pain management: pain level controlled Vital Signs Assessment: post-procedure vital signs reviewed and stable Respiratory status: spontaneous breathing, nonlabored ventilation, respiratory function stable and patient connected to nasal cannula oxygen Cardiovascular status: blood pressure returned to baseline and stable Postop Assessment: no apparent nausea or vomiting Anesthetic complications: no   No complications documented.  Last Vitals:  Vitals:   05/15/20 2205 05/15/20 2306  BP: (!) 101/50 (!) 103/47  Pulse: 89 87  Resp: 15 15  Temp: 36.7 C 36.7 C  SpO2: 96% 96%    Last Pain:  Vitals:   05/15/20 2318  TempSrc:   PainSc: 7                  Tiajuana Amass

## 2020-05-16 NOTE — Progress Notes (Signed)
Physical Therapy Treatment Patient Details Name: Regina Berry MRN: [id number] DOB: May 11, 1951 Today's Date: 05/16/2020    History of Present Illness Patient is 69 y.o. female s/p I&D of L THR (Lt THA posterolateral approach on 03/17/20) with PMH significant for HTN, COPD, GERD, OA, anxiety, hypothyroidism.    PT Comments    Pt in good spirits and with pain well controlled following second I&D of L hip yesterday.  Pt up to ambulate in halls with min difficulty.  Will follow up with stair training.   Follow Up Recommendations  Follow surgeon's recommendation for DC plan and follow-up therapies     Equipment Recommendations  None recommended by PT    Recommendations for Other Services       Precautions / Restrictions Precautions Precautions: Fall;Posterior Hip Precaution Booklet Issued: Yes (comment) Precaution Comments: Pt able to recall 3/3 hip precautions. She still requires intermittent cues to put them into practice Restrictions Weight Bearing Restrictions: No Other Position/Activity Restrictions: WBAT    Mobility  Bed Mobility Overal bed mobility: Needs Assistance Bed Mobility: Supine to Sit     Supine to sit: Supervision     General bed mobility comments: Increased time with min cues for sequence and adherence to THP  Transfers Overall transfer level: Needs assistance Equipment used: Rolling walker (2 wheeled) Transfers: Sit to/from Stand Sit to Stand: Supervision         General transfer comment: min cues for LE management and for use of UEs to self assist  Ambulation/Gait Ambulation/Gait assistance: Min guard;Supervision Gait Distance (Feet): 400 Feet Assistive device: Rolling walker (2 wheeled) Gait Pattern/deviations: Step-to pattern;Step-through pattern;Decreased step length - right;Decreased step length - left;Shuffle Gait velocity: decr   General Gait Details: Min guard for safety.  Min cues for posture and position from [employer] Mobility    Modified Rankin (Stroke Patients Only)       Balance Overall balance assessment: Needs assistance Sitting-balance support: Feet supported Sitting balance-Leahy Scale: Good     Standing balance support: No upper extremity supported Standing balance-Leahy Scale: Fair                              Cognition Arousal/Alertness: Awake/alert Behavior During Therapy: WFL for tasks assessed/performed Overall Cognitive Status: Within Functional Limits for tasks assessed                                        Exercises      General Comments        Pertinent Vitals/Pain Pain Assessment: 0-10 Pain Score: 2  Pain Location: L hip Pain Descriptors / Indicators: Discomfort;Sore;Aching Pain Intervention(s): Limited activity within patient's tolerance;Monitored during session;Premedicated before session;Ice applied    Home Living                      Prior Function            PT Goals (current goals can now be found in the care plan section) Acute Rehab PT Goals Patient Stated Goal: recover with therapy and get back to independence PT Goal Formulation: With patient Time For Goal Achievement: 03/24/20 Potential to Achieve Goals: Good Progress towards PT goals: Progressing toward goals    Frequency    7X/week  PT Plan Current plan remains appropriate    Co-evaluation              AM-PAC PT "6 Clicks" Mobility   Outcome Measure  Help needed turning from your back to your side while in a flat bed without using bedrails?: A Little Help needed moving from lying on your back to sitting on the side of a flat bed without using bedrails?: A Little Help needed moving to and from a bed to a chair (including a wheelchair)?: A Little Help needed standing up from a chair using your arms (e.g., wheelchair or bedside chair)?: A Little Help needed to walk in hospital room?: A Little Help needed  climbing 3-5 steps with a railing? : A Little 6 Click Score: 18    End of Session Equipment Utilized During Treatment: Gait belt Activity Tolerance: Patient tolerated treatment well Patient left: with call bell/phone within reach;in chair Nurse Communication: Mobility status PT Visit Diagnosis: Muscle weakness (generalized) (M62.81);Difficulty in walking, not elsewhere classified (R26.2)     Time: 9969-2493 PT Time Calculation (min) (ACUTE ONLY): 22 min  Charges:  $Gait Training: 8-22 mins                     [place] Pager [phone] Office [phone]    Tovah Slavick 05/16/2020, 10:26 AM

## 2020-05-16 NOTE — Progress Notes (Signed)
[facility] Progress Note  Patient ID: CAYLE CORDOBA MRN: [id number] DOB/AGE: 09-11-1950 69 y.o.  Subjective:  No complaints  Doing well  Worked with with therapy   Some dizziness today    ROS As above  Objective:   VITALS:   Vitals:   05/16/20 0138 05/16/20 0601 05/16/20 0959 05/16/20 1318  BP: (!) 108/52 (!) 101/46 (!) 145/68 (!) 123/59  Pulse: 85 84 98 78  Resp:  16 18 16   Temp:  98.1 F (36.7 C) 98 F (36.7 C) (!) 97.5 F (36.4 C)  TempSrc:  Oral    SpO2:  97% 98% 98%  Weight:      Height:        Estimated body mass index is 36.27 kg/m as calculated from the following:   Height as of this encounter: 5\' 3"  (1.6 m).   Weight as of this encounter: 92.9 kg.   Intake/Output      09/11 0701 - 09/12 0700   P.O. 240   I.V. (mL/kg) 119.5 (1.3)   Other 0   IV Piggyback 100   Total Intake(mL/kg) 459.5 (4.9)   Urine (mL/kg/hr) 500 (0.4)   Drains 100   Stool 0   Blood    Total Output 600   Net -140.5       Stool Occurrence 1 x     LABS  Results for orders placed or performed during the hospital encounter of 05/13/20 (from the past 24 hour(s))  CBC     Status: Abnormal   Collection Time: 05/16/20  3:40 AM  Result Value Ref Range   WBC 11.5 (H) 4.0 - 10.5 K/uL   RBC 2.59 (L) 3.87 - 5.11 MIL/uL   Hemoglobin 7.3 (L) 12.0 - 15.0 g/dL   HCT 23.1 (L) 36 - 46 %   MCV 89.2 80.0 - 100.0 fL   MCH 28.2 26.0 - 34.0 pg   MCHC 31.6 30.0 - 36.0 g/dL   RDW 15.1 11.5 - 15.5 %   Platelets 287 150 - 400 K/uL   nRBC 0.0 0.0 - 0.2 %  Basic metabolic panel     Status: Abnormal   Collection Time: 05/16/20  3:40 AM  Result Value Ref Range   Sodium 142 135 - 145 mmol/L   Potassium 4.2 3.5 - 5.1 mmol/L   Chloride 104 98 - 111 mmol/L   CO2 29 22 - 32 mmol/L   Glucose, Bld 203 (H) 70 - 99 mg/dL   BUN 16 8 - 23 mg/dL   Creatinine, Ser 0.73 0.44 - 1.00 mg/dL   Calcium 8.7 (L) 8.9 -  10.3 mg/dL   GFR calc non Af Amer >60 >60 mL/min   GFR calc Af Amer >60 >60 mL/min   Anion gap 9 5 - 15     PHYSICAL EXAM:   Gen: resting comfortably in bed, NAD, pleasant  Lungs: unlabored, clear  Cardiac: regular  Ext:       Left Lower Extremity   Incisional prevena in place, good seal   hemovacs in place, patent   Ext warm   + dp pulse  No DCT   Distal motor and sensory intact  Swelling stable   Assessment/Plan: 1 Day Post-Op   Active Problems:   Left hip postoperative wound infection   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[place])   Anti-infectives (From admission, onward)   Start     Dose/Rate Route Frequency Ordered Stop   05/15/20 1700  cefTRIAXone (ROCEPHIN) 2 g in sodium chloride 0.9 % 100 mL IVPB        2 g 200 mL/hr over 30 Minutes Intravenous Every 24 hours 05/15/20 1431     05/15/20 0000  cefTRIAXone (ROCEPHIN) IVPB        2 g Intravenous Every 24 hours 05/15/20 1856 06/26/20 2359   05/14/20 2200  vancomycin (VANCOREADY) IVPB 1250 mg/250 mL  Status:  Discontinued        1,250 mg 166.7 mL/hr over 90 Minutes Intravenous Every 12 hours 05/14/20 0852 05/14/20 1021   05/14/20 1600  DAPTOmycin (CUBICIN) 750 mg in sodium chloride 0.9 % IVPB  Status:  Discontinued        750 mg 230 mL/hr over 30 Minutes Intravenous Daily 05/14/20 1021 05/15/20 1430   05/14/20 0200  piperacillin-tazobactam (ZOSYN) IVPB 3.375 g  Status:  Discontinued        3.375 g 12.5 mL/hr over 240 Minutes Intravenous Every 8 hours 05/13/20 2148 05/15/20 1430   05/13/20 2200  vancomycin (VANCOREADY) IVPB 750 mg/150 mL        750 mg 150 mL/hr over 60 Minutes Intravenous  Once 05/13/20 2148 05/14/20 0038   05/13/20 1903  vancomycin (VANCOCIN) powder  Status:  Discontinued          As needed 05/13/20 1903 05/13/20 2054   05/13/20 1830  piperacillin-tazobactam (ZOSYN) IVPB 3.375 g        3.375 g 100 mL/hr over 30 Minutes Intravenous  Once 05/13/20 1815 05/13/20 1849   05/13/20 1810  vancomycin  (VANCOCIN) 1-5 GM/200ML-% IVPB       Note to Pharmacy: Georgena Spurling   : cabinet override      05/13/20 1810 05/14/20 0614   05/13/20 1810  piperacillin-tazobactam (ZOSYN) 3.375 GM/50ML IVPB       Note to Pharmacy: Georgena Spurling   : cabinet override      05/13/20 1810 05/13/20 1820    .  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   -L THA infection s/p I&D and poly exchange  WBAT L leg   prevena remains in place until follow up   Possible home tomorrow    PICC has been placed   ID following for abx selection    Poly-microbial infection   - Pain management:  Continue current regimen   - ABL anemia/Hemodynamics  Pt symptomatic today   Transfuse 2 units PRBCs   Cbc in am   - Medical issues   Home meds  - DVT/PE prophylaxis:  lovenox x 30 days - ID:   Per ID   - Dispo:  Likely home tomorrow     Jari Pigg, PA-C [phone] (C) 05/16/2020, 9:06 PM  [facility] [place] [address] [phone] Domingo Sep (F)

## 2020-05-16 NOTE — Plan of Care (Signed)
  Problem: Education: Goal: Knowledge of General Education information will improve Description: Including pain rating scale, medication(s)/side effects and non-pharmacologic comfort measures Outcome: Progressing   Problem: Clinical Measurements: Goal: Will remain free from infection Outcome: Progressing   Problem: Pain Managment: Goal: General experience of comfort will improve Outcome: Progressing   

## 2020-05-16 NOTE — Progress Notes (Signed)
Physical Therapy Treatment Patient Details Name: Regina Berry MRN: [id number] DOB: 01-Feb-1951 Today's Date: 05/16/2020    History of Present Illness Patient is 69 y.o. female s/p I&D of L THR (Lt THA posterolateral approach on 03/17/20) with PMH significant for HTN, COPD, GERD, OA, anxiety, hypothyroidism.    PT Comments    Pt with continued improvement in activity tolerance and quality of performance.  Will follow up in am with stair training.   Follow Up Recommendations  Follow surgeon's recommendation for DC plan and follow-up therapies     Equipment Recommendations  None recommended by PT    Recommendations for Other Services       Precautions / Restrictions Precautions Precautions: Fall;Posterior Hip Precaution Comments: Pt able to recall 3/3 hip precautions. She still requires intermittent cues to put them into practice Restrictions Weight Bearing Restrictions: No Other Position/Activity Restrictions: WBAT    Mobility  Bed Mobility               General bed mobility comments: Pt up in chair on arrival and up to bathroom at session end for bathing  Transfers Overall transfer level: Needs assistance Equipment used: Rolling walker (2 wheeled) Transfers: Sit to/from Stand Sit to Stand: Supervision         General transfer comment: min cues for LE management and for use of UEs to self assist  Ambulation/Gait Ambulation/Gait assistance: Min guard;Supervision Gait Distance (Feet): 400 Feet Assistive device: Rolling walker (2 wheeled) Gait Pattern/deviations: Step-to pattern;Step-through pattern;Decreased step length - right;Decreased step length - left;Shuffle Gait velocity: decr   General Gait Details: Min guard for safety.  Min cues for posture and position from [employer] Mobility    Modified Rankin (Stroke Patients Only)       Balance Overall balance assessment: Needs assistance Sitting-balance support: Feet  supported Sitting balance-Leahy Scale: Good     Standing balance support: No upper extremity supported Standing balance-Leahy Scale: Fair                              Cognition Arousal/Alertness: Awake/alert Behavior During Therapy: WFL for tasks assessed/performed Overall Cognitive Status: Within Functional Limits for tasks assessed                                        Exercises      General Comments        Pertinent Vitals/Pain Pain Assessment: 0-10 Pain Score: 2  Pain Location: L hip Pain Descriptors / Indicators: Discomfort;Sore;Aching Pain Intervention(s): Limited activity within patient's tolerance;Monitored during session    Home Living                      Prior Function            PT Goals (current goals can now be found in the care plan section) Acute Rehab PT Goals Patient Stated Goal: recover with therapy and get back to independence PT Goal Formulation: With patient Time For Goal Achievement: 03/24/20 Potential to Achieve Goals: Good Progress towards PT goals: Progressing toward goals    Frequency    7X/week      PT Plan Current plan remains appropriate    Co-evaluation  AM-PAC PT "6 Clicks" Mobility   Outcome Measure  Help needed turning from your back to your side while in a flat bed without using bedrails?: A Little Help needed moving from lying on your back to sitting on the side of a flat bed without using bedrails?: A Little Help needed moving to and from a bed to a chair (including a wheelchair)?: A Little Help needed standing up from a chair using your arms (e.g., wheelchair or bedside chair)?: A Little Help needed to walk in hospital room?: A Little Help needed climbing 3-5 steps with a railing? : A Little 6 Click Score: 18    End of Session Equipment Utilized During Treatment: Gait belt Activity Tolerance: Patient tolerated treatment well Patient left: Other (comment)  (bathroom) Nurse Communication: Mobility status PT Visit Diagnosis: Muscle weakness (generalized) (M62.81);Difficulty in walking, not elsewhere classified (R26.2)     Time: 2376-2831 PT Time Calculation (min) (ACUTE ONLY): 16 min  Charges:  $Gait Training: 8-22 mins                     [place] Pager [phone] Office [phone]    Katherine 05/16/2020, 2:04 PM

## 2020-05-17 LAB — CREATININE, SERUM
Creatinine, Ser: 0.84 mg/dL (ref 0.44–1.00)
GFR calc Af Amer: >60 mL/min (ref >60)
GFR calc non Af Amer: >60 mL/min (ref >60)

## 2020-05-17 LAB — CBC
HCT: 30.6 % — ABNORMAL LOW (ref 36.0–46.0)
Hemoglobin: 9.9 g/dL — ABNORMAL LOW (ref 12.0–15.0)
MCH: 28 pg (ref 26.0–34.0)
MCHC: 32.4 g/dL (ref 30.0–36.0)
MCV: 86.7 fL (ref 80.0–100.0)
Platelets: 450 10*3/uL — ABNORMAL HIGH (ref 150–400)
RBC: 3.53 MIL/uL — ABNORMAL LOW (ref 3.87–5.11)
RDW: 15 % (ref 11.5–15.5)
WBC: 22.6 10*3/uL — ABNORMAL HIGH (ref 4.0–10.5)
nRBC: 0 % (ref 0.0–0.2)

## 2020-05-17 LAB — PREPARE RBC (CROSSMATCH)

## 2020-05-17 NOTE — Progress Notes (Signed)
Physical Therapy Treatment Patient Details Name: Regina Berry MRN: [id number] DOB: 1951-07-12 Today's Date: 05/17/2020    History of Present Illness Patient is 69 y.o. female s/p I&D of L THR (Lt THA posterolateral approach on 03/17/20) with PMH significant for HTN, COPD, GERD, OA, anxiety, hypothyroidism.    PT Comments    Pt continues to progress with mobility and with noted increased activity tolerance and stability with gait.  This date pt ambulated increased distance in hall including limited distance with [facility] and up/down step to enter home.   Pt reports at point of admit she has several OP PT appts left and would like to continue with same at point of dc.  Follow Up Recommendations  Follow surgeon's recommendation for DC plan and follow-up therapies;Outpatient PT     Equipment Recommendations  None recommended by PT    Recommendations for Other Services       Precautions / Restrictions Precautions Precautions: Fall;Posterior Hip Precaution Comments: Pt able to recall 3/3 hip precautions. She still requires intermittent cues to put them into practice Restrictions Weight Bearing Restrictions: No Other Position/Activity Restrictions: WBAT    Mobility  Bed Mobility Overal bed mobility: Needs Assistance Bed Mobility: Supine to Sit     Supine to sit: Modified independent (Device/Increase time)        Transfers Overall transfer level: Needs assistance Equipment used: Rolling walker (2 wheeled) Transfers: Sit to/from Stand Sit to Stand: Supervision         General transfer comment: min cues for LE management and for use of UEs to self assist  Ambulation/Gait Ambulation/Gait assistance: Supervision Gait Distance (Feet): 500 Feet Assistive device: Rolling walker (2 wheeled);Straight cane Gait Pattern/deviations: Step-to pattern;Step-through pattern;Decreased step length - right;Decreased step length - left;Shuffle Gait velocity: decr   General Gait Details:  400' with RW and 100' with SPC and min guard assist   Stairs Stairs: Yes Stairs assistance: Min guard Stair Management: Step to pattern;Forwards;With walker Number of Stairs: 1 General stair comments: VC safety, technique, sequence. Min guard for safety.   Wheelchair Mobility    Modified Rankin (Stroke Patients Only)       Balance Overall balance assessment: Needs assistance Sitting-balance support: Feet supported Sitting balance-Leahy Scale: Good     Standing balance support: No upper extremity supported Standing balance-Leahy Scale: Fair                              Cognition Arousal/Alertness: Awake/alert Behavior During Therapy: WFL for tasks assessed/performed Overall Cognitive Status: Within Functional Limits for tasks assessed                                        Exercises Total Joint Exercises Ankle Circles/Pumps: AROM;Both;15 reps;Supine Quad Sets: AROM;Both;Supine;15 reps Heel Slides: AAROM;Left;20 reps;Supine Hip ABduction/ADduction: AAROM;Left;20 reps;Supine    General Comments        Pertinent Vitals/Pain Pain Assessment: 0-10 Pain Score: 2  Pain Location: L hip Pain Descriptors / Indicators: Discomfort;Sore Pain Intervention(s): Limited activity within patient's tolerance;Monitored during session    Home Living                      Prior Function            PT Goals (current goals can now be found in the care plan section) Acute Rehab PT  Goals Patient Stated Goal: recover with therapy and get back to independence PT Goal Formulation: With patient Time For Goal Achievement: 03/24/20 Potential to Achieve Goals: Good Progress towards PT goals: Progressing toward goals    Frequency    7X/week      PT Plan Discharge plan needs to be updated    Co-evaluation              AM-PAC PT "6 Clicks" Mobility   Outcome Measure  Help needed turning from your back to your side while in a flat  bed without using bedrails?: A Little Help needed moving from lying on your back to sitting on the side of a flat bed without using bedrails?: None Help needed moving to and from a bed to a chair (including a wheelchair)?: A Little Help needed standing up from a chair using your arms (e.g., wheelchair or bedside chair)?: A Little Help needed to walk in hospital room?: A Little Help needed climbing 3-5 steps with a railing? : A Little 6 Click Score: 19    End of Session Equipment Utilized During Treatment: Gait belt Activity Tolerance: Patient tolerated treatment well Patient left: in chair;with call bell/phone within reach;with chair alarm set;with nursing/sitter in room Nurse Communication: Mobility status PT Visit Diagnosis: Muscle weakness (generalized) (M62.81);Difficulty in walking, not elsewhere classified (R26.2)     Time: 1833-5825 PT Time Calculation (min) (ACUTE ONLY): 30 min  Charges:  $Gait Training: 8-22 mins $Therapeutic Exercise: 8-22 mins                     [place] Pager [phone] Office [phone]    Cameron Katayama 05/17/2020, 11:32 AM

## 2020-05-17 NOTE — Plan of Care (Signed)
Patient to discharge. Deanna Artis, rn

## 2020-05-17 NOTE — Progress Notes (Signed)
Orthopaedic Trauma Service (OTS)  2 Days Post-Op Procedure(s) (LRB): IRRIGATION AND DEBRIDEMENT HIP LEFT HIP, POSSIBLE POLLY EXCHANGE (Left) APPLICATION OF WOUND VAC (Left)  Subjective: Patient reports pain as mild and denies light headedness. Did receive 1 uPRBC overnight, awaiting new lab result.    Objective: Current Vitals Blood pressure (!) 141/64, pulse 73, temperature 97.9 F (36.6 C), temperature source Oral, resp. rate 20, height 5\' 3"  (1.6 m), weight 92.9 kg, SpO2 99 %. Vital signs in last 24 hours: Temp:  [97.5 F (36.4 C)-98 F (36.7 C)] 97.9 F (36.6 C) (09/12 0626) Pulse Rate:  [73-84] 73 (09/12 0626) Resp:  [16-20] 20 (09/12 0626) BP: (91-141)/(59-75) 141/64 (09/12 0626) SpO2:  [97 %-100 %] 99 % (09/12 0626)  Intake/Output from previous day: 09/11 0701 - 09/12 0700 In: 795.5 [P.O.:240; I.V.:155.5; Blood:300; IV Piggyback:100] Out: 615 [Urine:500; Drains:115]  LABS Recent Labs    05/15/20 0350 05/15/20 1848 05/16/20 0340  HGB 8.7* 9.5* 7.3*   Recent Labs    05/15/20 0350 05/15/20 0350 05/15/20 1848 05/16/20 0340  WBC 20.1*  --   --  11.5*  RBC 3.10*  --   --  2.59*  HCT 27.3*   < > 28.0* 23.1*  PLT 366  --   --  287   < > = values in this interval not displayed.   Recent Labs    05/15/20 1310 05/15/20 1310 05/15/20 1848 05/16/20 0340  NA 139   < > 141 142  K 3.7   < > 3.9 4.2  CL 102   < > 100 104  CO2 28  --   --  29  BUN 15   < > 13 16  CREATININE 0.78   < > 0.80 0.73  GLUCOSE 68*   < > 74 203*  CALCIUM 9.2  --   --  8.7*   < > = values in this interval not displayed.   No results for input(s): LABPT, INR in the last 72 hours. E Coli which is pan sensitive, but still awaiting GP rod and GP cocci speciation  Physical Exam LLE Dressing intact, clean, dry; Provena remains in place  No drainage output last shift with decreasing amounts in two shifts prior  Edema/ swelling controlled  Sens: DPN, SPN, TN intact  Motor: EHL, FHL, and  lessor toe ext and flex all intact grossly  Brisk cap refill, warm to touch After removal of drains HV was noted to be clogged; no active drainage from drain sites at this time  Assessment/Plan: 2 Days Post-Op Procedure(s) (LRB): IRRIGATION AND DEBRIDEMENT HIP LEFT HIP, POSSIBLE POLLY EXCHANGE (Left) APPLICATION OF WOUND VAC (Left) 1. PT/OT  2. DVT proph Lovenox 3. Continue IV Abx 4. If H/H stable then ok for d/c today  5. F/u w/in 8 days Dr. Sabino Gasser, MD [facility], [facility] [phone]

## 2020-05-17 NOTE — Discharge Summary (Signed)
Discharge Summary  Patient ID: Regina Berry MRN: [id number] DOB/AGE: 1951/05/19 69 y.o.  Admit date: 05/13/2020 Discharge date: 05/17/2020  Admission Diagnoses: L total hip postoperative infection   Discharge Diagnoses:  Active Problems:   Left hip postoperative wound infection   Infected prosthesis of left hip [facility])   Past Medical History:  Diagnosis Date  . Anemia   . Anxiety   . Arthritis    Lt hip  . Cancer ([place]) 2020   skin  Rt leg   . Chronic bronchitis ([place])   . COPD (chronic obstructive pulmonary disease) ([place])   . Dyspnea   . GERD (gastroesophageal reflux disease)   . Hypertension   . Hypothyroidism   . Obesity (BMI 30-39.9) 12/21/2017  . OSA on CPAP 12/21/2017   Severe with AHI 29.4/h and oxygen desaturations as low as 75%.  She is now on CPAP at 6 cm water pressure with 2 L oxygen due to nocturnal hypoxemia  . Pelvic pressure in female 01/23/2014  . Positive fecal occult blood test 06/23/2015  . Rectocele 01/23/2014  . 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 Thyroid disease     Surgeries: Procedure(s): IRRIGATION AND DEBRIDEMENT HIP LEFT HIP, POSSIBLE POLLY EXCHANGE APPLICATION OF WOUND VAC on 05/13/2020 IRRIGATION AND DEBRIDEMENT HIP LEFT HIP, POSSIBLE POLLY EXCHANGE APPLICATION OF WOUND VAC on 05/15/2020   Consultants (if any):   Discharged Condition: Improved  Hospital Course: Regina Berry is an 69 y.o. female who was admitted 05/13/2020 with a diagnosis of L hip postoperative infection and went to the operating room on 05/13/2020 and 05/15/2020 and underwent the above named procedures.    She was given antibiotics and will be discharged with IV antibiotics:  Anti-infectives (From admission, onward)   Start     Dose/Rate Route Frequency Ordered Stop   05/15/20 1700  cefTRIAXone (ROCEPHIN) 2 g in sodium chloride 0.9 % 100 mL IVPB        2 g 200 mL/hr over 30 Minutes Intravenous Every 24 hours 05/15/20 1431     05/15/20 0000  cefTRIAXone (ROCEPHIN) IVPB        2 g  Intravenous Every 24 hours 05/15/20 1856 06/26/20 2359   05/14/20 2200  vancomycin (VANCOREADY) IVPB 1250 mg/250 mL  Status:  Discontinued        1,250 mg 166.7 mL/hr over 90 Minutes Intravenous Every 12 hours 05/14/20 0852 05/14/20 1021   05/14/20 1600  DAPTOmycin (CUBICIN) 750 mg in sodium chloride 0.9 % IVPB  Status:  Discontinued        750 mg 230 mL/hr over 30 Minutes Intravenous Daily 05/14/20 1021 05/15/20 1430   05/14/20 0200  piperacillin-tazobactam (ZOSYN) IVPB 3.375 g  Status:  Discontinued        3.375 g 12.5 mL/hr over 240 Minutes Intravenous Every 8 hours 05/13/20 2148 05/15/20 1430   05/13/20 2200  vancomycin (VANCOREADY) IVPB 750 mg/150 mL        750 mg 150 mL/hr over 60 Minutes Intravenous  Once 05/13/20 2148 05/14/20 0038   05/13/20 1903  vancomycin (VANCOCIN) powder  Status:  Discontinued          As needed 05/13/20 1903 05/13/20 2054   05/13/20 1830  piperacillin-tazobactam (ZOSYN) IVPB 3.375 g        3.375 g 100 mL/hr over 30 Minutes Intravenous  Once 05/13/20 1815 05/13/20 1849   05/13/20 1810  vancomycin (VANCOCIN) 1-5 GM/200ML-% IVPB       Note to Pharmacy: Georgena Spurling   :  cabinet override      05/13/20 1810 05/14/20 0614   05/13/20 1810  piperacillin-tazobactam (ZOSYN) 3.375 GM/50ML IVPB       Note to Pharmacy: Georgena Spurling   : cabinet override      05/13/20 1810 05/13/20 1820    .  She was given sequential compression devices, early ambulation, and lovenox for DVT prophylaxis.  She benefited maximally from the hospital stay, she will be discharged with two drains as well as a pravena wound vac.  Recent vital signs:  Vitals:   05/17/20 0145 05/17/20 0626  BP: 118/60 (!) 141/64  Pulse: 74 73  Resp: 20 20  Temp: 98 F (36.7 C) 97.9 F (36.6 C)  SpO2: 100% 99%    Recent laboratory studies:  Lab Results  Component Value Date   HGB 7.3 (L) 05/16/2020   HGB 9.5 (L) 05/15/2020   HGB 8.7 (L) 05/15/2020   Lab Results  Component Value Date   WBC  11.5 (H) 05/16/2020   PLT 287 05/16/2020   No results found for: INR Lab Results  Component Value Date   NA 142 05/16/2020   K 4.2 05/16/2020   CL 104 05/16/2020   CO2 29 05/16/2020   BUN 16 05/16/2020   CREATININE 0.73 05/16/2020   GLUCOSE 203 (H) 05/16/2020    Discharge Medications:   Allergies as of 05/17/2020      Reactions   Codeine Itching      Medication List    STOP taking these medications   aspirin EC 325 MG tablet   ondansetron 4 MG tablet Commonly known as: Zofran   oxyCODONE 5 MG immediate release tablet Commonly known as: Roxicodone   sennosides-docusate sodium 8.6-50 MG tablet Commonly known as: SENOKOT-S     TAKE these medications   baclofen 10 MG tablet Commonly known as: LIORESAL Take 1 tablet (10 mg total) by mouth 3 (three) times daily. As needed for muscle spasm   buPROPion 300 MG 24 hr tablet Commonly known as: WELLBUTRIN XL Take 300 mg by mouth daily.   CAL-MAG-ZINC PO Take 1 tablet by mouth daily.   cefTRIAXone  IVPB Commonly known as: ROCEPHIN Inject 2 g into the vein daily. Indication:  Prosthetic hip infection First Dose: Yes Last Day of Therapy:  06/26/2020 Labs - Once weekly:  CBC/D and BMP, Labs - Every other week:  ESR and CRP Method of administration: IV Push Method of administration may be changed at the discretion of home infusion pharmacist based upon assessment of the patient and/or caregiver's ability to self-administer the medication ordered.   citalopram 40 MG tablet Commonly known as: CELEXA Take 40 mg by mouth at bedtime.   enoxaparin 40 MG/0.4ML injection Commonly known as: Lovenox Inject 0.4 mLs (40 mg total) into the skin daily.   Fish Oil 1000 MG Caps Take 1,000 mg by mouth daily.   fluticasone 50 MCG/ACT nasal spray Commonly known as: FLONASE Place 2 sprays into both nostrils daily as needed (allergies.).   furosemide 20 MG tablet Commonly known as: LASIX Take 20 mg by mouth daily as needed (fluid  retention.).   HYDROcodone-acetaminophen 5-325 MG tablet Commonly known as: Norco Take 1 tablet by mouth every 4 (four) hours as needed for moderate pain. MAXIMUM TOTAL ACETAMINOPHEN DOSE IS 4000 MG PER DAY   levocetirizine 5 MG tablet Commonly known as: XYZAL Take 5 mg by mouth at bedtime.   levothyroxine 88 MCG tablet Commonly known as: SYNTHROID Take 88 mcg by mouth daily  before breakfast.   losartan-hydrochlorothiazide 100-25 MG tablet Commonly known as: HYZAAR Take 1 tablet by mouth daily.   polyethylene glycol powder 17 GM/SCOOP powder Commonly known as: MiraLax Take 17 g by mouth daily. To prevent constipation What changed:   when to take this  reasons to take this   potassium chloride SA 20 MEQ tablet Commonly known as: KLOR-CON Take 20 mEq by mouth daily.   vitamin B-12 1000 MCG tablet Commonly known as: CYANOCOBALAMIN Take 1,000 mcg by mouth daily.   zolpidem 10 MG tablet Commonly known as: AMBIEN Take 10 mg by mouth at bedtime as needed for sleep.            Discharge Care Instructions  (From admission, onward)         Start     Ordered   05/15/20 0000  Change dressing on IV access line weekly and PRN  (Home infusion instructions - [facility] )        05/15/20 1856          Diagnostic Studies: US Guided Needle Placement  Result Date: 05/07/2020 INDICATION: History of left total hip replacement, now with symptomatic fluid deep to the left lateral thigh incision. Request made for ultrasound-guided aspiration for therapeutic and diagnostic purposes. EXAM: ULTRASOUND-GUIDED ASPIRATION OF COMPLEX FLUID COLLECTION SUBJACENT TO LEFT LATERAL THIGH INCISION COMPARISON:  None. MEDICATIONS: None ANESTHESIA/SEDATION: None CONTRAST:  None COMPLICATIONS: None immediate. PROCEDURE: Informed written consent was obtained from the patient after a discussion of the risks, benefits and alternatives to treatment. The patient was placed right lateral  decubitus on a hospital gurney with preprocedural ultrasound imaging of the left lateral thigh demonstrating a large (at least 11.6 x 3.4 x 6.6 cm complex fluid collection within the subcutaneous tissues subjacent to the lateral thigh incision as a sequela of recent left total hip replacement. The procedure was planned. A timeout was performed prior to the initiation of the procedure. The skin overlying the left lateral thigh was prepped and draped in the usual sterile fashion. The overlying soft tissues were anesthetized with 1% lidocaine with epinephrine. Next, under direct ultrasound guidance, an 18 gauge trocar needle was advanced into the complex fluid collection. A short Amplatz wire was coiled within collection and the trocar needle was exchanged for a Yueh sheath catheter. Multiple ultrasound images were saved procedural documentation purposes. Approximately 40 cc of Davidson Palmieri colored serous fluid was aspirated as the Yueh sheath catheter was slowly withdrawn. Again, an 18 gauge trocar needle was advanced into a different component of the complex subcutaneous collection. Again, the trocar needle was exchanged for a Yueh sheath catheter which was again utilized to aspirate approximately 40 cc of Jarrah Seher colored serous fluid as the Yueh sheath catheter was slowly withdrawn. Postprocedural imaging was obtained and the procedure was terminated. Dressing was applied. The patient tolerated the procedure well without immediate post procedural complication. IMPRESSION: Technically successful ultrasound-guided aspiration of a total of approximately 80 cc of Shamecka Hocutt colored serous fluid from the subcutaneous collection subjacent to the incision about the lateral aspect thigh. Samples were sent to the laboratory as requested by the ordering clinical team. Electronically Signed   By: Sandi Mariscal M.D.   On: 05/07/2020 16:03   DG Pelvis Portable  Result Date: 05/15/2020 CLINICAL DATA:  Debridement of infected left hip  arthroplasty EXAM: DG HIP (WITH OR WITHOUT PELVIS) 1V PORT LEFT; PORTABLE PELVIS 1-2 VIEWS COMPARISON:  05/13/2020 FINDINGS: A frontal view of the pelvis excluding the iliac crest  as well as a cross-table lateral view of the left hip are obtained. Left hip arthroplasty is in the expected position with no evidence of metallic hardware failure or loosening. No acute fracture. Surgical drain within the soft tissues overlying the left hip. IMPRESSION: 1. Left hip arthroplasty as above. 2. Surgical drains within the overlying soft tissues consistent with reported debridement. Electronically Signed   By: Randa Ngo M.D.   On: 05/15/2020 19:45   DG Pelvis Portable  Result Date: 05/13/2020 CLINICAL DATA:  Left hip arthroplasty EXAM: PORTABLE PELVIS 1-2 VIEWS; DG HIP (WITH OR WITHOUT PELVIS) 1V PORT LEFT COMPARISON:  03/17/2020 FINDINGS: Pelvis: Frontal view of the lower pelvis including both hips was performed. Iliac crests and sacrum are excluded by collimation. Left hip arthroplasty is identified in stable position with no evidence of complication. No acute displaced fracture. Left hip: 3 cross-table lateral views of the left hip are obtained. Left hip arthroplasty is in expected position with no signs of complication. No evidence of fracture. IMPRESSION: 1. Unremarkable left hip arthroplasty. Electronically Signed   By: Randa Ngo M.D.   On: 05/13/2020 20:46   DG Hip Port Unilat With Pelvis 1V Left  Result Date: 05/15/2020 CLINICAL DATA:  Debridement of infected left hip arthroplasty EXAM: DG HIP (WITH OR WITHOUT PELVIS) 1V PORT LEFT; PORTABLE PELVIS 1-2 VIEWS COMPARISON:  05/13/2020 FINDINGS: A frontal view of the pelvis excluding the iliac crest as well as a cross-table lateral view of the left hip are obtained. Left hip arthroplasty is in the expected position with no evidence of metallic hardware failure or loosening. No acute fracture. Surgical drain within the soft tissues overlying the left hip.  IMPRESSION: 1. Left hip arthroplasty as above. 2. Surgical drains within the overlying soft tissues consistent with reported debridement. Electronically Signed   By: Randa Ngo M.D.   On: 05/15/2020 19:45   DG Hip Port Unilat With Pelvis 1V Left  Result Date: 05/13/2020 CLINICAL DATA:  Left hip arthroplasty EXAM: PORTABLE PELVIS 1-2 VIEWS; DG HIP (WITH OR WITHOUT PELVIS) 1V PORT LEFT COMPARISON:  03/17/2020 FINDINGS: Pelvis: Frontal view of the lower pelvis including both hips was performed. Iliac crests and sacrum are excluded by collimation. Left hip arthroplasty is identified in stable position with no evidence of complication. No acute displaced fracture. Left hip: 3 cross-table lateral views of the left hip are obtained. Left hip arthroplasty is in expected position with no signs of complication. No evidence of fracture. IMPRESSION: 1. Unremarkable left hip arthroplasty. Electronically Signed   By: Randa Ngo M.D.   On: 05/13/2020 20:46   [place] EKG SITE RITE  Result Date: 05/13/2020 If Site Rite image not attached, placement could not be confirmed due to current cardiac rhythm.   Disposition: Discharge disposition: 01-Home or Self Care       Discharge Instructions    [facility] pharmacist to adjust dose for Vancomycin, Aminoglycosides and other anti-infective therapies as requested by physician.   Complete by: As directed    [facility] to provide Cath Flo 20m   Complete by: As directed    Administer for PICC line occlusion and as ordered by physician for other access device issues.   Anaphylaxis Kit: Provided to treat any anaphylactic reaction to the medication being provided to the patient if First Dose or when requested by physician   Complete by: As directed    Epinephrine 130mml vial / amp: Administer 0.64m52m0.64ml51mubcutaneously once for moderate to severe anaphylaxis,  nurse to call physician and pharmacy when reaction occurs and call 911 if needed for  immediate care   Diphenhydramine 3m/ml IV vial: Administer 25-571mIV/IM PRN for first dose reaction, rash, itching, mild reaction, nurse to call physician and pharmacy when reaction occurs   Sodium Chloride 0.9% NS 50031mV: Administer if needed for hypovolemic blood pressure drop or as ordered by physician after call to physician with anaphylactic reaction   Change dressing on IV access line weekly and PRN   Complete by: As directed    Discharge patient   Complete by: As directed    Discharge disposition: 01-Home or Self Care   Discharge patient date: 05/17/2020   Flush IV access with Sodium Chloride 0.9% and Heparin 10 units/ml or 100 units/ml   Complete by: As directed    Home infusion instructions - [facility]   Complete by: As directed    Instructions: Flush IV access with Sodium Chloride 0.9% and Heparin 10units/ml or 100units/ml   Change dressing on IV access line: Weekly and PRN   Instructions Cath Flo 2mg36mdminister for PICC Line occlusion and as ordered by physician for other access device   [facility] pharmacist to adjust dose for: Vancomycin, Aminoglycosides and other anti-infective therapies as requested by physician   Method of administration may be changed at the discretion of home infusion pharmacist based upon assessment of the patient and/or caregiver's ability to self-administer the medication ordered   Complete by: As directed        Follow-up Information    Schedule an appointment as soon as possible for a visit with LandMarchia Bond.   Specialty: Orthopedic Surgery Why: We would like to see you Monday 9/13 or Wednesday 9/15 we will have someone in our office call you first thing Monday morning Contact information: [id number] [phone]            Signed: BlaiVentura BrunsC 05/17/2020, 9:58 AM

## 2020-05-17 NOTE — Progress Notes (Signed)
Patient verbalized understanding of dc instructions and additional education including: PICC line for outpatient antibiotics, and Provena (portable wound vac). Patient left with all belongings including provena (switched over prior to dc), the box and instructions for troubleshooting.

## 2020-05-17 NOTE — Plan of Care (Signed)
  Problem: Education: Goal: Knowledge of General Education information will improve Description: Including pain rating scale, medication(s)/side effects and non-pharmacologic comfort measures Outcome: Progressing   Problem: Clinical Measurements: Goal: Will remain free from infection Outcome: Progressing   

## 2020-05-18 ENCOUNTER — Encounter (HOSPITAL_COMMUNITY): Payer: Self-pay | Admitting: Orthopedic Surgery

## 2020-05-18 LAB — AEROBIC/ANAEROBIC CULTURE W GRAM STAIN (SURGICAL/DEEP WOUND)

## 2020-05-19 ENCOUNTER — Telehealth (HOSPITAL_COMMUNITY): Payer: Self-pay | Admitting: Physical Therapy

## 2020-05-19 DIAGNOSIS — K219 Gastro-esophageal reflux disease without esophagitis: Secondary | ICD-10-CM | POA: Diagnosis not present

## 2020-05-19 DIAGNOSIS — Z9981 Dependence on supplemental oxygen: Secondary | ICD-10-CM | POA: Diagnosis not present

## 2020-05-19 DIAGNOSIS — M349 Systemic sclerosis, unspecified: Secondary | ICD-10-CM | POA: Diagnosis not present

## 2020-05-19 DIAGNOSIS — J449 Chronic obstructive pulmonary disease, unspecified: Secondary | ICD-10-CM | POA: Diagnosis not present

## 2020-05-19 DIAGNOSIS — E669 Obesity, unspecified: Secondary | ICD-10-CM | POA: Diagnosis not present

## 2020-05-19 DIAGNOSIS — Z6836 Body mass index (BMI) 36.0-36.9, adult: Secondary | ICD-10-CM | POA: Diagnosis not present

## 2020-05-19 DIAGNOSIS — Z792 Long term (current) use of antibiotics: Secondary | ICD-10-CM | POA: Diagnosis not present

## 2020-05-19 DIAGNOSIS — L02416 Cutaneous abscess of left lower limb: Secondary | ICD-10-CM | POA: Diagnosis not present

## 2020-05-19 DIAGNOSIS — Z452 Encounter for adjustment and management of vascular access device: Secondary | ICD-10-CM | POA: Diagnosis not present

## 2020-05-19 DIAGNOSIS — F419 Anxiety disorder, unspecified: Secondary | ICD-10-CM | POA: Diagnosis not present

## 2020-05-19 DIAGNOSIS — I10 Essential (primary) hypertension: Secondary | ICD-10-CM | POA: Diagnosis not present

## 2020-05-19 DIAGNOSIS — D649 Anemia, unspecified: Secondary | ICD-10-CM | POA: Diagnosis not present

## 2020-05-19 DIAGNOSIS — Z9181 History of falling: Secondary | ICD-10-CM | POA: Diagnosis not present

## 2020-05-19 DIAGNOSIS — G4733 Obstructive sleep apnea (adult) (pediatric): Secondary | ICD-10-CM | POA: Diagnosis not present

## 2020-05-19 DIAGNOSIS — E039 Hypothyroidism, unspecified: Secondary | ICD-10-CM | POA: Diagnosis not present

## 2020-05-19 DIAGNOSIS — F17210 Nicotine dependence, cigarettes, uncomplicated: Secondary | ICD-10-CM | POA: Diagnosis not present

## 2020-05-19 DIAGNOSIS — L03116 Cellulitis of left lower limb: Secondary | ICD-10-CM | POA: Diagnosis not present

## 2020-05-19 DIAGNOSIS — F329 Major depressive disorder, single episode, unspecified: Secondary | ICD-10-CM | POA: Diagnosis not present

## 2020-05-19 DIAGNOSIS — Z85828 Personal history of other malignant neoplasm of skin: Secondary | ICD-10-CM | POA: Diagnosis not present

## 2020-05-19 DIAGNOSIS — T8452XA Infection and inflammatory reaction due to internal left hip prosthesis, initial encounter: Secondary | ICD-10-CM | POA: Diagnosis not present

## 2020-05-19 NOTE — Telephone Encounter (Signed)
pt cancelled appt for 9/15 because her MD does not want her doing therapy until her incision heals

## 2020-05-20 ENCOUNTER — Encounter (HOSPITAL_COMMUNITY): Payer: Medicare Other | Admitting: Physical Therapy

## 2020-05-20 LAB — TYPE AND SCREEN
ABO/RH(D): B POS
Antibody Screen: NEGATIVE
Unit division: 0
Unit division: 0

## 2020-05-20 LAB — BPAM RBC
Blood Product Expiration Date: 202110102359
Blood Product Expiration Date: 202110102359
ISSUE DATE / TIME: 202109120139
Unit Type and Rh: 7300
Unit Type and Rh: 7300

## 2020-05-21 DIAGNOSIS — L03116 Cellulitis of left lower limb: Secondary | ICD-10-CM | POA: Diagnosis not present

## 2020-05-21 DIAGNOSIS — J449 Chronic obstructive pulmonary disease, unspecified: Secondary | ICD-10-CM | POA: Diagnosis not present

## 2020-05-21 DIAGNOSIS — I10 Essential (primary) hypertension: Secondary | ICD-10-CM | POA: Diagnosis not present

## 2020-05-21 DIAGNOSIS — L02416 Cutaneous abscess of left lower limb: Secondary | ICD-10-CM | POA: Diagnosis not present

## 2020-05-21 DIAGNOSIS — M349 Systemic sclerosis, unspecified: Secondary | ICD-10-CM | POA: Diagnosis not present

## 2020-05-21 DIAGNOSIS — T8452XA Infection and inflammatory reaction due to internal left hip prosthesis, initial encounter: Secondary | ICD-10-CM | POA: Diagnosis not present

## 2020-05-25 DIAGNOSIS — T8452XA Infection and inflammatory reaction due to internal left hip prosthesis, initial encounter: Secondary | ICD-10-CM | POA: Diagnosis not present

## 2020-05-25 DIAGNOSIS — L02416 Cutaneous abscess of left lower limb: Secondary | ICD-10-CM | POA: Diagnosis not present

## 2020-05-25 DIAGNOSIS — I10 Essential (primary) hypertension: Secondary | ICD-10-CM | POA: Diagnosis not present

## 2020-05-25 DIAGNOSIS — L03116 Cellulitis of left lower limb: Secondary | ICD-10-CM | POA: Diagnosis not present

## 2020-05-25 DIAGNOSIS — J449 Chronic obstructive pulmonary disease, unspecified: Secondary | ICD-10-CM | POA: Diagnosis not present

## 2020-05-25 DIAGNOSIS — M349 Systemic sclerosis, unspecified: Secondary | ICD-10-CM | POA: Diagnosis not present

## 2020-05-26 ENCOUNTER — Telehealth: Payer: Self-pay

## 2020-05-26 NOTE — Telephone Encounter (Signed)
Called patient to get a hsfu scheduled per Colletta [place], patient said she would call back after she found out more details about it.. If patient calls back she needs to be seen 2-3 with Dr. Tommy Medal or [facility]

## 2020-05-28 DIAGNOSIS — M349 Systemic sclerosis, unspecified: Secondary | ICD-10-CM | POA: Diagnosis not present

## 2020-05-28 DIAGNOSIS — L03116 Cellulitis of left lower limb: Secondary | ICD-10-CM | POA: Diagnosis not present

## 2020-05-28 DIAGNOSIS — J449 Chronic obstructive pulmonary disease, unspecified: Secondary | ICD-10-CM | POA: Diagnosis not present

## 2020-05-28 DIAGNOSIS — T8452XA Infection and inflammatory reaction due to internal left hip prosthesis, initial encounter: Secondary | ICD-10-CM | POA: Diagnosis not present

## 2020-05-28 DIAGNOSIS — L02416 Cutaneous abscess of left lower limb: Secondary | ICD-10-CM | POA: Diagnosis not present

## 2020-05-28 DIAGNOSIS — I10 Essential (primary) hypertension: Secondary | ICD-10-CM | POA: Diagnosis not present

## 2020-05-31 DIAGNOSIS — T8452XA Infection and inflammatory reaction due to internal left hip prosthesis, initial encounter: Secondary | ICD-10-CM | POA: Diagnosis not present

## 2020-05-31 DIAGNOSIS — L03116 Cellulitis of left lower limb: Secondary | ICD-10-CM | POA: Diagnosis not present

## 2020-06-01 DIAGNOSIS — J449 Chronic obstructive pulmonary disease, unspecified: Secondary | ICD-10-CM | POA: Diagnosis not present

## 2020-06-01 DIAGNOSIS — I10 Essential (primary) hypertension: Secondary | ICD-10-CM | POA: Diagnosis not present

## 2020-06-01 DIAGNOSIS — L03116 Cellulitis of left lower limb: Secondary | ICD-10-CM | POA: Diagnosis not present

## 2020-06-01 DIAGNOSIS — T8452XA Infection and inflammatory reaction due to internal left hip prosthesis, initial encounter: Secondary | ICD-10-CM | POA: Diagnosis not present

## 2020-06-01 DIAGNOSIS — M349 Systemic sclerosis, unspecified: Secondary | ICD-10-CM | POA: Diagnosis not present

## 2020-06-01 DIAGNOSIS — L02416 Cutaneous abscess of left lower limb: Secondary | ICD-10-CM | POA: Diagnosis not present

## 2020-06-02 DIAGNOSIS — T8149XA Infection following a procedure, other surgical site, initial encounter: Secondary | ICD-10-CM | POA: Diagnosis not present

## 2020-06-02 DIAGNOSIS — Z01818 Encounter for other preprocedural examination: Secondary | ICD-10-CM | POA: Diagnosis not present

## 2020-06-02 DIAGNOSIS — M79605 Pain in left leg: Secondary | ICD-10-CM | POA: Diagnosis not present

## 2020-06-02 DIAGNOSIS — T8452XA Infection and inflammatory reaction due to internal left hip prosthesis, initial encounter: Secondary | ICD-10-CM | POA: Diagnosis not present

## 2020-06-04 DIAGNOSIS — M349 Systemic sclerosis, unspecified: Secondary | ICD-10-CM | POA: Diagnosis not present

## 2020-06-04 DIAGNOSIS — I10 Essential (primary) hypertension: Secondary | ICD-10-CM | POA: Diagnosis not present

## 2020-06-04 DIAGNOSIS — L03116 Cellulitis of left lower limb: Secondary | ICD-10-CM | POA: Diagnosis not present

## 2020-06-04 DIAGNOSIS — J449 Chronic obstructive pulmonary disease, unspecified: Secondary | ICD-10-CM | POA: Diagnosis not present

## 2020-06-04 DIAGNOSIS — T8452XA Infection and inflammatory reaction due to internal left hip prosthesis, initial encounter: Secondary | ICD-10-CM | POA: Diagnosis not present

## 2020-06-04 DIAGNOSIS — L02416 Cutaneous abscess of left lower limb: Secondary | ICD-10-CM | POA: Diagnosis not present

## 2020-06-08 DIAGNOSIS — M349 Systemic sclerosis, unspecified: Secondary | ICD-10-CM | POA: Diagnosis not present

## 2020-06-08 DIAGNOSIS — L02416 Cutaneous abscess of left lower limb: Secondary | ICD-10-CM | POA: Diagnosis not present

## 2020-06-08 DIAGNOSIS — J449 Chronic obstructive pulmonary disease, unspecified: Secondary | ICD-10-CM | POA: Diagnosis not present

## 2020-06-08 DIAGNOSIS — T8452XA Infection and inflammatory reaction due to internal left hip prosthesis, initial encounter: Secondary | ICD-10-CM | POA: Diagnosis not present

## 2020-06-08 DIAGNOSIS — L03116 Cellulitis of left lower limb: Secondary | ICD-10-CM | POA: Diagnosis not present

## 2020-06-08 DIAGNOSIS — I10 Essential (primary) hypertension: Secondary | ICD-10-CM | POA: Diagnosis not present

## 2020-06-09 DIAGNOSIS — I10 Essential (primary) hypertension: Secondary | ICD-10-CM | POA: Diagnosis not present

## 2020-06-09 DIAGNOSIS — T8452XA Infection and inflammatory reaction due to internal left hip prosthesis, initial encounter: Secondary | ICD-10-CM | POA: Diagnosis not present

## 2020-06-09 DIAGNOSIS — J449 Chronic obstructive pulmonary disease, unspecified: Secondary | ICD-10-CM | POA: Diagnosis not present

## 2020-06-09 DIAGNOSIS — M349 Systemic sclerosis, unspecified: Secondary | ICD-10-CM | POA: Diagnosis not present

## 2020-06-09 DIAGNOSIS — L02416 Cutaneous abscess of left lower limb: Secondary | ICD-10-CM | POA: Diagnosis not present

## 2020-06-09 DIAGNOSIS — L03116 Cellulitis of left lower limb: Secondary | ICD-10-CM | POA: Diagnosis not present

## 2020-06-12 DIAGNOSIS — L03116 Cellulitis of left lower limb: Secondary | ICD-10-CM | POA: Diagnosis not present

## 2020-06-12 DIAGNOSIS — T8452XA Infection and inflammatory reaction due to internal left hip prosthesis, initial encounter: Secondary | ICD-10-CM | POA: Diagnosis not present

## 2020-06-12 DIAGNOSIS — L02416 Cutaneous abscess of left lower limb: Secondary | ICD-10-CM | POA: Diagnosis not present

## 2020-06-12 DIAGNOSIS — M349 Systemic sclerosis, unspecified: Secondary | ICD-10-CM | POA: Diagnosis not present

## 2020-06-12 DIAGNOSIS — J449 Chronic obstructive pulmonary disease, unspecified: Secondary | ICD-10-CM | POA: Diagnosis not present

## 2020-06-12 DIAGNOSIS — I10 Essential (primary) hypertension: Secondary | ICD-10-CM | POA: Diagnosis not present

## 2020-06-15 DIAGNOSIS — J449 Chronic obstructive pulmonary disease, unspecified: Secondary | ICD-10-CM | POA: Diagnosis not present

## 2020-06-15 DIAGNOSIS — I10 Essential (primary) hypertension: Secondary | ICD-10-CM | POA: Diagnosis not present

## 2020-06-15 DIAGNOSIS — M349 Systemic sclerosis, unspecified: Secondary | ICD-10-CM | POA: Diagnosis not present

## 2020-06-15 DIAGNOSIS — L02416 Cutaneous abscess of left lower limb: Secondary | ICD-10-CM | POA: Diagnosis not present

## 2020-06-15 DIAGNOSIS — T8452XA Infection and inflammatory reaction due to internal left hip prosthesis, initial encounter: Secondary | ICD-10-CM | POA: Diagnosis not present

## 2020-06-15 DIAGNOSIS — L03116 Cellulitis of left lower limb: Secondary | ICD-10-CM | POA: Diagnosis not present

## 2020-06-16 ENCOUNTER — Ambulatory Visit (INDEPENDENT_AMBULATORY_CARE_PROVIDER_SITE_OTHER): Payer: Medicare Other | Admitting: Family

## 2020-06-16 ENCOUNTER — Encounter: Payer: Self-pay | Admitting: Family

## 2020-06-16 ENCOUNTER — Other Ambulatory Visit: Payer: Self-pay

## 2020-06-16 VITALS — BP 119/73 | HR 74 | Temp 98.3°F | Wt 196.0 lb

## 2020-06-16 DIAGNOSIS — T8452XD Infection and inflammatory reaction due to internal left hip prosthesis, subsequent encounter: Secondary | ICD-10-CM

## 2020-06-16 MED ORDER — CEPHALEXIN 500 MG PO CAPS: 500.0000 mg | ORAL_CAPSULE | Freq: Three times a day (TID) | ORAL | 1 refills | Status: DC

## 2020-06-16 NOTE — Patient Instructions (Signed)
Nice to see you.  We will continue your ceftriaxone through 10/22.   We will change to oral keflex at that time until your exchange and plan on an additional 6 weeks of antibiotics.   Plan for follow up in 2 months or sooner if needed.   Have a great day and stay safe!

## 2020-06-16 NOTE — Assessment & Plan Note (Signed)
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  She has been receiving her ceftriaxone without problem.  Inflammatory markers remain elevated although improved since hospitalization.  Tentatively scheduled for two-step exchange in [place].  Discussed plan of care to include continued ceftriaxone through 10/22 and will transition to oral Keflex until surgery where she will likely require an additional PICC line and 6 weeks of IV antibiotics prior to hip exchange.  Plan for follow-up in 2 months or sooner if needed.

## 2020-06-16 NOTE — Progress Notes (Signed)
Subjective:    Patient ID: Regina Berry, female    DOB: 1950-12-08, 69 y.o.   MRN: [id number]  Chief Complaint  Patient presents with  . Hospitalization Follow-up    Infected prosthesis of left hip     HPI:  Regina Berry is a 69 y.o. female with previous medical history of severe osteoarthritis status post total hip arthroplasty in 8/58 complicated by development of erythematous and darkening around one of the sutures.  Seen in the orthopedic office with elevated sed rate and CRP.  Aspiration was negative for organisms.  Continue to have worsening fevers and chills and underwent surgical exploration through I&D where a large amount of pus was encountered.  Line was exchanged at that time.  Started on broad-spectrum antibiotics with postoperative cultures positive for E. coli.  There was also concern for possible beta-hemolytic streptococcal species which was not identified.  She was placed on ceftriaxone for 6 weeks with end date scheduled for 06/26/2020.  In the interim she has been scheduled for two-step exchange in [place].  Most recent lab work shows elevated inflammatory markers with CRP of 25 and sedimentation rate of 44.  Here today for hospitalization follow-up.  Regina Berry has been receiving her ceftriaxone daily as prescribed with no adverse side effects or missed doses since leaving the hospital.  PICC line is without evidence of infection.  Has noted recently it is more difficult to draw blood but infuses without problem.  Denies fevers, chills, or sweats.  Has questions about sutures that remain in place since initial incision and drainage.   Allergies  Allergen Reactions  . Codeine Itching      Outpatient Medications Prior to Visit  Medication Sig Dispense Refill  . baclofen (LIORESAL) 10 MG tablet Take 1 tablet (10 mg total) by mouth 3 (three) times daily. As needed for muscle spasm 30 tablet 0  . buPROPion (WELLBUTRIN XL) 300 MG 24 hr tablet Take 300 mg by mouth  daily.    . Calcium-Magnesium-Zinc (CAL-MAG-ZINC PO) Take 1 tablet by mouth daily.    . cefTRIAXone (ROCEPHIN) IVPB Inject 2 g into the vein daily. Indication:  Prosthetic hip infection First Dose: Yes Last Day of Therapy:  06/26/2020 Labs - Once weekly:  CBC/D and BMP, Labs - Every other week:  ESR and CRP Method of administration: IV Push Method of administration may be changed at the discretion of home infusion pharmacist based upon assessment of the patient and/or caregiver's ability to self-administer the medication ordered. 42 Units 0  . citalopram (CELEXA) 40 MG tablet Take 40 mg by mouth at bedtime.    . fluticasone (FLONASE) 50 MCG/ACT nasal spray Place 2 sprays into both nostrils daily as needed (allergies.).     Marland Kitchen furosemide (LASIX) 20 MG tablet Take 20 mg by mouth daily as needed (fluid retention.).     Marland Kitchen HYDROcodone-acetaminophen (NORCO) 5-325 MG tablet Take 1 tablet by mouth every 4 (four) hours as needed for moderate pain. MAXIMUM TOTAL ACETAMINOPHEN DOSE IS 4000 MG PER DAY 30 tablet 0  . levocetirizine (XYZAL) 5 MG tablet Take 5 mg by mouth at bedtime.     Marland Kitchen levothyroxine (SYNTHROID) 88 MCG tablet Take 88 mcg by mouth daily before breakfast.     . losartan-hydrochlorothiazide (HYZAAR) 100-25 MG tablet Take 1 tablet by mouth daily.     . Omega-3 Fatty Acids (FISH OIL) 1000 MG CAPS Take 1,000 mg by mouth daily.    . polyethylene glycol powder (MIRALAX) powder  Take 17 g by mouth daily. To prevent constipation (Patient taking differently: Take 17 g by mouth daily as needed. To prevent constipation) 255 g prn  . potassium chloride SA (K-DUR) 20 MEQ tablet Take 20 mEq by mouth daily.     . vitamin B-12 (CYANOCOBALAMIN) 1000 MCG tablet Take 1,000 mcg by mouth daily.    Marland Kitchen zolpidem (AMBIEN) 10 MG tablet Take 10 mg by mouth at bedtime as needed for sleep.     Marland Kitchen enoxaparin (LOVENOX) 40 MG/0.4ML injection Inject 0.4 mLs (40 mg total) into the skin daily. (Patient not taking: Reported on  06/16/2020) 12 mL 0   No facility-administered medications prior to visit.     Past Medical History:  Diagnosis Date  . Anemia   . Anxiety   . Arthritis    Lt hip  . Cancer ([place]) 2020   skin  Rt leg   . Chronic bronchitis ([place])   . COPD (chronic obstructive pulmonary disease) ([place])   . Dyspnea   . GERD (gastroesophageal reflux disease)   . Hypertension   . Hypothyroidism   . Obesity (BMI 30-39.9) 12/21/2017  . OSA on CPAP 12/21/2017   Severe with AHI 29.4/h and oxygen desaturations as low as 75%.  She is now on CPAP at 6 cm water pressure with 2 L oxygen due to nocturnal hypoxemia  . Pelvic pressure in female 01/23/2014  . Positive fecal occult blood test 06/23/2015  . Rectocele 01/23/2014  . 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 Thyroid disease       Past Surgical History:  Procedure Laterality Date  . APPLICATION OF WOUND VAC Left 05/15/2020   Procedure: APPLICATION OF WOUND VAC;  Surgeon: Marchia Bond, MD;  [facility];  Service: Orthopedics;  Laterality: Left;  . CESAREAN SECTION    . COLONOSCOPY N/A 09/11/2015   Procedure: COLONOSCOPY;  Surgeon: Rogene Houston, MD;  [facility];  Service: Endoscopy;  Laterality: N/A;  moved to 1/6 @ 1:35 - Ann to notify pt  . EYE SURGERY Bilateral 2021  . INCISION AND DRAINAGE HIP Left 05/15/2020   Procedure: IRRIGATION AND DEBRIDEMENT HIP LEFT HIP, POSSIBLE POLLY EXCHANGE;  Surgeon: Marchia Bond, MD;  [facility];  Service: Orthopedics;  Laterality: Left;  . LUNG SURGERY Left    infection on outside of lung that had to be"scaped off".  Tildon Husky REPAIR N/A 01/02/2018   Procedure: POSTERIOR REPAIR (RECTOCELE);  Surgeon: Jonnie Kind, MD;  Location: [facility];  Service: Gynecology;  Laterality: N/A;  . TOTAL HIP ARTHROPLASTY Left 03/17/2020   Procedure: TOTAL HIP ARTHROPLASTY;  Surgeon: Marchia Bond, MD;  [facility];  Service: Orthopedics;  Laterality: Left;  . TOTAL HIP ARTHROPLASTY Left 05/13/2020   Procedure:  TOTAL HIP ARTHROPLASTY IRRIGATION AND DEBRIDEMENT, POLY EXCHANGE;  Surgeon: Marchia Bond, MD;  [facility];  Service: Orthopedics;  Laterality: Left;      Family History  Problem Relation Age of Onset  . Diabetes Mother   . Cancer Mother        ovarian  . COPD Mother   . Diabetes Father   . Cancer Father        lymphoma  . Heart disease Father   . Other Daughter        transverse mylitis      Social History   Socioeconomic History  . Marital status: Married    Spouse name: Not on file  . Number of children: Not on file  .  Years of education: Not on file  . Highest education level: Not on file  Occupational History  . Not on file  Tobacco Use  . Smoking status: Current Every Day Smoker    Packs/day: 1.00    Years: 40.00    Pack years: 40.00    Types: Cigarettes  . Smokeless tobacco: Never Used  Vaping Use  . Vaping Use: Never assessed  Substance and Sexual Activity  . Alcohol use: Yes    Alcohol/week: 0.0 standard drinks    Comment: occasionally  . Drug use: No  . Sexual activity: Not Currently    Birth control/protection: Post-menopausal  Other Topics Concern  . Not on file  Social History Narrative  . Not on file   Social Determinants of Health   Financial Resource Strain:   . Difficulty of Paying Living Expenses: Not on file  Food Insecurity:   . Worried About Programme researcher, broadcasting/film/video in the Last Year: Not on file  . Ran Out of Food in the Last Year: Not on file  Transportation Needs:   . Lack of Transportation (Medical): Not on file  . Lack of Transportation (Non-Medical): Not on file  Physical Activity:   . Days of Exercise per Week: Not on file  . Minutes of Exercise per Session: Not on file  Stress:   . Feeling of Stress : Not on file  Social Connections:   . Frequency of Communication with Friends and Family: Not on file  . Frequency of Social Gatherings with Friends and Family: Not on file  . Attends Religious Services: Not on file  .  Active Member of Clubs or Organizations: Not on file  . Attends Banker Meetings: Not on file  . Marital Status: Not on file  Intimate Partner Violence:   . Fear of Current or Ex-Partner: Not on file  . Emotionally Abused: Not on file  . Physically Abused: Not on file  . Sexually Abused: Not on file      Review of Systems  Constitutional: Negative for chills, diaphoresis, fatigue and fever.  Respiratory: Negative for cough, chest tightness, shortness of breath and wheezing.   Cardiovascular: Negative for chest pain.  Gastrointestinal: Negative for abdominal pain, diarrhea, nausea and vomiting.       Objective:    BP 119/73   Pulse 74   Temp 98.3 F (36.8 C) (Oral)   Wt 196 lb (88.9 kg)   BMI 34.72 kg/m  Nursing note and vital signs reviewed.  Physical Exam Constitutional:      General: She is not in acute distress.    Appearance: She is well-developed.  Cardiovascular:     Rate and Rhythm: Normal rate and regular rhythm.     Heart sounds: Normal heart sounds.  Pulmonary:     Effort: Pulmonary effort is normal.     Breath sounds: Normal breath sounds.  Skin:    General: Skin is warm and dry.  Neurological:     Mental Status: She is alert and oriented to person, place, and time.  Psychiatric:        Behavior: Behavior normal.        Thought Content: Thought content normal.        Judgment: Judgment normal.         Assessment & Plan:   Patient Active Problem List   Diagnosis Date Noted  . Left hip postoperative wound infection 05/13/2020  . Infected prosthesis of left hip (HCC) 05/13/2020  . Status  post total hip replacement, 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  . Osteoarthritis of left hip 03/17/2020  . Enterocele 01/02/2018  . OSA on CPAP 12/21/2017  . Obesity (BMI 30-39.9) 12/21/2017  . Depression 08/18/2015  . Positive fecal occult blood test 06/23/2015  . Pelvic pressure in female 01/23/2014  . 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  She has been receiving her ceftriaxone without problem.  Inflammatory markers remain elevated although improved since hospitalization.  Tentatively scheduled for two-step exchange in [place].  Discussed plan of care to include continued ceftriaxone through 10/22 and will transition to oral Keflex until surgery where she will likely require an additional PICC line and 6 weeks of IV antibiotics prior to hip exchange.  Plan for follow-up in 2 months or sooner if needed.      Relevant Medications   cephALEXin (KEFLEX) 500 MG capsule       I am having Regina Berry start on cephALEXin. I am also having her maintain her furosemide, levothyroxine, zolpidem, losartan-hydrochlorothiazide, Calcium-Magnesium-Zinc (CAL-MAG-ZINC PO), levocetirizine, fluticasone, polyethylene glycol powder, buPROPion, potassium chloride SA, Fish Oil, vitamin B-12, citalopram, cefTRIAXone, enoxaparin, baclofen, and HYDROcodone-acetaminophen.   Meds ordered this encounter  Medications  . cephALEXin (KEFLEX) 500 MG capsule    Sig: Take 1 capsule (500 mg total) by mouth 3 (three) times daily. Start on 06/26/2020    Dispense:  90 capsule    Refill:  1    Start on 06/26/2020 following completion of IV antibiotics    Order Specific Question:   Supervising Provider    Answer:   Carlyle Basques [[id number]]     Follow-up: Return in about 2 months (around 08/16/2020), or if symptoms worsen or fail to improve.    Terri Piedra, MSN, FNP-C Nurse Practitioner [facility] [place] number: [phone]

## 2020-06-18 DIAGNOSIS — E039 Hypothyroidism, unspecified: Secondary | ICD-10-CM | POA: Diagnosis not present

## 2020-06-18 DIAGNOSIS — K219 Gastro-esophageal reflux disease without esophagitis: Secondary | ICD-10-CM | POA: Diagnosis not present

## 2020-06-18 DIAGNOSIS — I10 Essential (primary) hypertension: Secondary | ICD-10-CM | POA: Diagnosis not present

## 2020-06-18 DIAGNOSIS — F17210 Nicotine dependence, cigarettes, uncomplicated: Secondary | ICD-10-CM | POA: Diagnosis not present

## 2020-06-18 DIAGNOSIS — L03116 Cellulitis of left lower limb: Secondary | ICD-10-CM | POA: Diagnosis not present

## 2020-06-18 DIAGNOSIS — E669 Obesity, unspecified: Secondary | ICD-10-CM | POA: Diagnosis not present

## 2020-06-18 DIAGNOSIS — Z9981 Dependence on supplemental oxygen: Secondary | ICD-10-CM | POA: Diagnosis not present

## 2020-06-18 DIAGNOSIS — F419 Anxiety disorder, unspecified: Secondary | ICD-10-CM | POA: Diagnosis not present

## 2020-06-18 DIAGNOSIS — Z23 Encounter for immunization: Secondary | ICD-10-CM | POA: Diagnosis not present

## 2020-06-18 DIAGNOSIS — Z1331 Encounter for screening for depression: Secondary | ICD-10-CM | POA: Diagnosis not present

## 2020-06-18 DIAGNOSIS — Z0001 Encounter for general adult medical examination with abnormal findings: Secondary | ICD-10-CM | POA: Diagnosis not present

## 2020-06-18 DIAGNOSIS — Z9181 History of falling: Secondary | ICD-10-CM | POA: Diagnosis not present

## 2020-06-18 DIAGNOSIS — T8452XA Infection and inflammatory reaction due to internal left hip prosthesis, initial encounter: Secondary | ICD-10-CM | POA: Diagnosis not present

## 2020-06-18 DIAGNOSIS — Z792 Long term (current) use of antibiotics: Secondary | ICD-10-CM | POA: Diagnosis not present

## 2020-06-18 DIAGNOSIS — J449 Chronic obstructive pulmonary disease, unspecified: Secondary | ICD-10-CM | POA: Diagnosis not present

## 2020-06-18 DIAGNOSIS — L02416 Cutaneous abscess of left lower limb: Secondary | ICD-10-CM | POA: Diagnosis not present

## 2020-06-18 DIAGNOSIS — F329 Major depressive disorder, single episode, unspecified: Secondary | ICD-10-CM | POA: Diagnosis not present

## 2020-06-18 DIAGNOSIS — D649 Anemia, unspecified: Secondary | ICD-10-CM | POA: Diagnosis not present

## 2020-06-18 DIAGNOSIS — Z85828 Personal history of other malignant neoplasm of skin: Secondary | ICD-10-CM | POA: Diagnosis not present

## 2020-06-18 DIAGNOSIS — G4733 Obstructive sleep apnea (adult) (pediatric): Secondary | ICD-10-CM | POA: Diagnosis not present

## 2020-06-18 DIAGNOSIS — Z6836 Body mass index (BMI) 36.0-36.9, adult: Secondary | ICD-10-CM | POA: Diagnosis not present

## 2020-06-18 DIAGNOSIS — E7849 Other hyperlipidemia: Secondary | ICD-10-CM | POA: Diagnosis not present

## 2020-06-18 DIAGNOSIS — Z452 Encounter for adjustment and management of vascular access device: Secondary | ICD-10-CM | POA: Diagnosis not present

## 2020-06-18 DIAGNOSIS — Z6832 Body mass index (BMI) 32.0-32.9, adult: Secondary | ICD-10-CM | POA: Diagnosis not present

## 2020-06-18 DIAGNOSIS — M349 Systemic sclerosis, unspecified: Secondary | ICD-10-CM | POA: Diagnosis not present

## 2020-06-18 DIAGNOSIS — R7309 Other abnormal glucose: Secondary | ICD-10-CM | POA: Diagnosis not present

## 2020-06-22 DIAGNOSIS — T8459XD Infection and inflammatory reaction due to other internal joint prosthesis, subsequent encounter: Secondary | ICD-10-CM | POA: Diagnosis not present

## 2020-06-22 DIAGNOSIS — M349 Systemic sclerosis, unspecified: Secondary | ICD-10-CM | POA: Diagnosis not present

## 2020-06-22 DIAGNOSIS — J449 Chronic obstructive pulmonary disease, unspecified: Secondary | ICD-10-CM | POA: Diagnosis not present

## 2020-06-22 DIAGNOSIS — T8452XA Infection and inflammatory reaction due to internal left hip prosthesis, initial encounter: Secondary | ICD-10-CM | POA: Diagnosis not present

## 2020-06-22 DIAGNOSIS — L02416 Cutaneous abscess of left lower limb: Secondary | ICD-10-CM | POA: Diagnosis not present

## 2020-06-22 DIAGNOSIS — I10 Essential (primary) hypertension: Secondary | ICD-10-CM | POA: Diagnosis not present

## 2020-06-22 DIAGNOSIS — L03116 Cellulitis of left lower limb: Secondary | ICD-10-CM | POA: Diagnosis not present

## 2020-06-23 ENCOUNTER — Telehealth: Payer: Self-pay

## 2020-06-23 NOTE — Telephone Encounter (Signed)
Yes - okay to pull PICC and transition to Keflex 500 mg PO tid #90.  Thanks.

## 2020-06-23 NOTE — Telephone Encounter (Signed)
Received call from [facility] asking for Pull Picc orders for patient that end ceftriaxoneon 06/26/20. LPN also verified with [facility] no orders in place at this time. Routing message to Terri Piedra, NP for orders/advise. Regina Berry

## 2020-06-23 NOTE — Telephone Encounter (Signed)
Spoke with [facility] with [facility] and gave verbal orders per Terri Piedra, NP to pull picc line at the end of therapy. Verbal orders read back and understood. Infusion team till notify [facility]. Eugenia Mcalpine

## 2020-06-26 ENCOUNTER — Telehealth: Payer: Self-pay | Admitting: *Deleted

## 2020-06-26 ENCOUNTER — Other Ambulatory Visit: Payer: Self-pay | Admitting: Family

## 2020-06-26 DIAGNOSIS — L03116 Cellulitis of left lower limb: Secondary | ICD-10-CM | POA: Diagnosis not present

## 2020-06-26 DIAGNOSIS — L02416 Cutaneous abscess of left lower limb: Secondary | ICD-10-CM | POA: Diagnosis not present

## 2020-06-26 DIAGNOSIS — J449 Chronic obstructive pulmonary disease, unspecified: Secondary | ICD-10-CM | POA: Diagnosis not present

## 2020-06-26 DIAGNOSIS — M349 Systemic sclerosis, unspecified: Secondary | ICD-10-CM | POA: Diagnosis not present

## 2020-06-26 DIAGNOSIS — I10 Essential (primary) hypertension: Secondary | ICD-10-CM | POA: Diagnosis not present

## 2020-06-26 DIAGNOSIS — T8452XA Infection and inflammatory reaction due to internal left hip prosthesis, initial encounter: Secondary | ICD-10-CM | POA: Diagnosis not present

## 2020-06-26 MED ORDER — FLUCONAZOLE 150 MG PO TABS: ORAL_TABLET | ORAL | 1 refills | Status: DC

## 2020-06-26 NOTE — Telephone Encounter (Signed)
Fluconazole has been sent to the pharmacy.

## 2020-06-26 NOTE — Telephone Encounter (Signed)
Relayed to patient. Thanks! 

## 2020-06-26 NOTE — Telephone Encounter (Signed)
Patient has developed a yeast infection after all of her antibiotics. She has tried multiple rounds of 3 day over the counter treatments without success. She is asking [name] to call in diflucan for her if appropriate.  Patient uses Product[facility]process development scientist in [place]. Please advise. Landis Gandy, RN

## 2020-07-06 DIAGNOSIS — E039 Hypothyroidism, unspecified: Secondary | ICD-10-CM | POA: Diagnosis not present

## 2020-07-06 DIAGNOSIS — T8452XA Infection and inflammatory reaction due to internal left hip prosthesis, initial encounter: Secondary | ICD-10-CM | POA: Diagnosis not present

## 2020-07-06 DIAGNOSIS — M349 Systemic sclerosis, unspecified: Secondary | ICD-10-CM | POA: Diagnosis not present

## 2020-07-06 DIAGNOSIS — F17210 Nicotine dependence, cigarettes, uncomplicated: Secondary | ICD-10-CM | POA: Diagnosis not present

## 2020-07-06 DIAGNOSIS — G4733 Obstructive sleep apnea (adult) (pediatric): Secondary | ICD-10-CM | POA: Diagnosis not present

## 2020-07-06 DIAGNOSIS — Z79890 Hormone replacement therapy (postmenopausal): Secondary | ICD-10-CM | POA: Diagnosis not present

## 2020-07-06 DIAGNOSIS — E669 Obesity, unspecified: Secondary | ICD-10-CM | POA: Diagnosis not present

## 2020-07-06 DIAGNOSIS — Z01818 Encounter for other preprocedural examination: Secondary | ICD-10-CM | POA: Diagnosis not present

## 2020-07-06 DIAGNOSIS — I10 Essential (primary) hypertension: Secondary | ICD-10-CM | POA: Diagnosis not present

## 2020-07-06 DIAGNOSIS — J449 Chronic obstructive pulmonary disease, unspecified: Secondary | ICD-10-CM | POA: Diagnosis not present

## 2020-07-06 DIAGNOSIS — Z96641 Presence of right artificial hip joint: Secondary | ICD-10-CM | POA: Diagnosis not present

## 2020-07-14 DIAGNOSIS — I7300 Raynaud's syndrome without gangrene: Secondary | ICD-10-CM | POA: Diagnosis not present

## 2020-07-14 DIAGNOSIS — E669 Obesity, unspecified: Secondary | ICD-10-CM | POA: Diagnosis not present

## 2020-07-14 DIAGNOSIS — Z6833 Body mass index (BMI) 33.0-33.9, adult: Secondary | ICD-10-CM | POA: Diagnosis not present

## 2020-07-14 DIAGNOSIS — M349 Systemic sclerosis, unspecified: Secondary | ICD-10-CM | POA: Diagnosis not present

## 2020-07-20 DIAGNOSIS — Z452 Encounter for adjustment and management of vascular access device: Secondary | ICD-10-CM | POA: Diagnosis not present

## 2020-07-20 DIAGNOSIS — Z79890 Hormone replacement therapy (postmenopausal): Secondary | ICD-10-CM | POA: Diagnosis not present

## 2020-07-20 DIAGNOSIS — M349 Systemic sclerosis, unspecified: Secondary | ICD-10-CM | POA: Diagnosis present

## 2020-07-20 DIAGNOSIS — E039 Hypothyroidism, unspecified: Secondary | ICD-10-CM | POA: Diagnosis present

## 2020-07-20 DIAGNOSIS — J449 Chronic obstructive pulmonary disease, unspecified: Secondary | ICD-10-CM | POA: Diagnosis present

## 2020-07-20 DIAGNOSIS — F17210 Nicotine dependence, cigarettes, uncomplicated: Secondary | ICD-10-CM | POA: Diagnosis present

## 2020-07-20 DIAGNOSIS — Z96641 Presence of right artificial hip joint: Secondary | ICD-10-CM | POA: Diagnosis present

## 2020-07-20 DIAGNOSIS — B999 Unspecified infectious disease: Secondary | ICD-10-CM | POA: Diagnosis not present

## 2020-07-20 DIAGNOSIS — I10 Essential (primary) hypertension: Secondary | ICD-10-CM | POA: Diagnosis present

## 2020-07-20 DIAGNOSIS — M009 Pyogenic arthritis, unspecified: Secondary | ICD-10-CM | POA: Diagnosis not present

## 2020-07-20 DIAGNOSIS — T8452XA Infection and inflammatory reaction due to internal left hip prosthesis, initial encounter: Secondary | ICD-10-CM | POA: Diagnosis not present

## 2020-07-20 DIAGNOSIS — Z96642 Presence of left artificial hip joint: Secondary | ICD-10-CM | POA: Diagnosis not present

## 2020-07-20 DIAGNOSIS — G4733 Obstructive sleep apnea (adult) (pediatric): Secondary | ICD-10-CM | POA: Diagnosis present

## 2020-07-22 ENCOUNTER — Telehealth: Payer: Self-pay

## 2020-07-22 NOTE — Telephone Encounter (Signed)
Received call from provider with [facility]. Patient planned to discharge tomorrow and follow up with [facility]. Plan is for patient to discharge on IV Rocephin due to infection associated with internal left hip prosthesis.  Provider states if Regina Berry has any question to give her a call. Routing to [place] to make aware.

## 2020-07-24 DIAGNOSIS — Z89622 Acquired absence of left hip joint: Secondary | ICD-10-CM | POA: Diagnosis not present

## 2020-07-24 DIAGNOSIS — Z79891 Long term (current) use of opiate analgesic: Secondary | ICD-10-CM | POA: Diagnosis not present

## 2020-07-24 DIAGNOSIS — T8452XA Infection and inflammatory reaction due to internal left hip prosthesis, initial encounter: Secondary | ICD-10-CM | POA: Diagnosis not present

## 2020-07-24 DIAGNOSIS — F17210 Nicotine dependence, cigarettes, uncomplicated: Secondary | ICD-10-CM | POA: Diagnosis not present

## 2020-07-24 DIAGNOSIS — Z7982 Long term (current) use of aspirin: Secondary | ICD-10-CM | POA: Diagnosis not present

## 2020-07-24 DIAGNOSIS — J449 Chronic obstructive pulmonary disease, unspecified: Secondary | ICD-10-CM | POA: Diagnosis not present

## 2020-07-24 DIAGNOSIS — T8452XD Infection and inflammatory reaction due to internal left hip prosthesis, subsequent encounter: Secondary | ICD-10-CM | POA: Diagnosis not present

## 2020-07-24 DIAGNOSIS — Z9981 Dependence on supplemental oxygen: Secondary | ICD-10-CM | POA: Diagnosis not present

## 2020-07-24 DIAGNOSIS — Z452 Encounter for adjustment and management of vascular access device: Secondary | ICD-10-CM | POA: Diagnosis not present

## 2020-07-24 DIAGNOSIS — Z792 Long term (current) use of antibiotics: Secondary | ICD-10-CM | POA: Diagnosis not present

## 2020-07-24 DIAGNOSIS — G4733 Obstructive sleep apnea (adult) (pediatric): Secondary | ICD-10-CM | POA: Diagnosis not present

## 2020-07-24 DIAGNOSIS — I10 Essential (primary) hypertension: Secondary | ICD-10-CM | POA: Diagnosis not present

## 2020-07-27 ENCOUNTER — Other Ambulatory Visit (HOSPITAL_COMMUNITY)
Admission: RE | Admit: 2020-07-27 | Discharge: 2020-07-27 | Disposition: A | Discharge status: Home / Self Care | Payer: Medicare Other | Source: Other Acute Inpatient Hospital | Attending: Family Medicine | Admitting: Family Medicine

## 2020-07-27 DIAGNOSIS — I10 Essential (primary) hypertension: Secondary | ICD-10-CM | POA: Diagnosis not present

## 2020-07-27 DIAGNOSIS — Z452 Encounter for adjustment and management of vascular access device: Secondary | ICD-10-CM | POA: Diagnosis not present

## 2020-07-27 DIAGNOSIS — T8452XA Infection and inflammatory reaction due to internal left hip prosthesis, initial encounter: Secondary | ICD-10-CM | POA: Insufficient documentation

## 2020-07-27 DIAGNOSIS — G4733 Obstructive sleep apnea (adult) (pediatric): Secondary | ICD-10-CM | POA: Diagnosis not present

## 2020-07-27 DIAGNOSIS — F17210 Nicotine dependence, cigarettes, uncomplicated: Secondary | ICD-10-CM | POA: Diagnosis not present

## 2020-07-27 DIAGNOSIS — J449 Chronic obstructive pulmonary disease, unspecified: Secondary | ICD-10-CM | POA: Diagnosis not present

## 2020-07-27 LAB — BASIC METABOLIC PANEL
Anion gap: 11 (ref 5–15)
BUN: 13 mg/dL (ref 8–23)
CO2: 31 mmol/L (ref 22–32)
Calcium: 9.1 mg/dL (ref 8.9–10.3)
Chloride: 95 mmol/L — ABNORMAL LOW (ref 98–111)
Creatinine, Ser: 0.82 mg/dL (ref 0.44–1.00)
GFR, Estimated: >60 mL/min (ref >60)
Glucose, Bld: 100 mg/dL — ABNORMAL HIGH (ref 70–99)
Potassium: 3.3 mmol/L — ABNORMAL LOW (ref 3.5–5.1)
Sodium: 137 mmol/L (ref 135–145)

## 2020-07-27 LAB — HEPATIC FUNCTION PANEL
ALT: 14 U/L (ref 0–44)
AST: 26 U/L (ref 15–41)
Albumin: 3.3 g/dL — ABNORMAL LOW (ref 3.5–5.0)
Alkaline Phosphatase: 54 U/L (ref 38–126)
Bilirubin, Direct: 0.1 mg/dL (ref 0.0–0.2)
Indirect Bilirubin: 0.2 mg/dL — ABNORMAL LOW (ref 0.3–0.9)
Total Bilirubin: 0.3 mg/dL (ref 0.3–1.2)
Total Protein: 6.2 g/dL — ABNORMAL LOW (ref 6.5–8.1)

## 2020-07-27 LAB — C-REACTIVE PROTEIN: CRP: 3.3 mg/dL — ABNORMAL HIGH (ref <1.0)

## 2020-07-27 LAB — CBC WITH DIFFERENTIAL/PLATELET
Abs Immature Granulocytes: 0.05 10*3/uL (ref 0.00–0.07)
Basophils Absolute: 0 10*3/uL (ref 0.0–0.1)
Basophils Relative: 0 %
Eosinophils Absolute: 0.7 10*3/uL — ABNORMAL HIGH (ref 0.0–0.5)
Eosinophils Relative: 8 %
HCT: 23.1 % — ABNORMAL LOW (ref 36.0–46.0)
Hemoglobin: 7.2 g/dL — ABNORMAL LOW (ref 12.0–15.0)
Immature Granulocytes: 1 %
Lymphocytes Relative: 18 %
Lymphs Abs: 1.5 10*3/uL (ref 0.7–4.0)
MCH: 27 pg (ref 26.0–34.0)
MCHC: 31.2 g/dL (ref 30.0–36.0)
MCV: 86.5 fL (ref 80.0–100.0)
Monocytes Absolute: 1.3 10*3/uL — ABNORMAL HIGH (ref 0.1–1.0)
Monocytes Relative: 15 %
Neutro Abs: 4.8 10*3/uL (ref 1.7–7.7)
Neutrophils Relative %: 58 %
Platelets: 284 10*3/uL (ref 150–400)
RBC: 2.67 MIL/uL — ABNORMAL LOW (ref 3.87–5.11)
RDW: 17.3 % — ABNORMAL HIGH (ref 11.5–15.5)
WBC: 8.3 10*3/uL (ref 4.0–10.5)
nRBC: 0 % (ref 0.0–0.2)

## 2020-07-27 LAB — SEDIMENTATION RATE: Sed Rate: 55 mm/hr — ABNORMAL HIGH (ref 0–22)

## 2020-07-27 NOTE — Telephone Encounter (Signed)
Thanks

## 2020-08-03 DIAGNOSIS — I10 Essential (primary) hypertension: Secondary | ICD-10-CM | POA: Diagnosis not present

## 2020-08-03 DIAGNOSIS — Z452 Encounter for adjustment and management of vascular access device: Secondary | ICD-10-CM | POA: Diagnosis not present

## 2020-08-03 DIAGNOSIS — T8452XA Infection and inflammatory reaction due to internal left hip prosthesis, initial encounter: Secondary | ICD-10-CM | POA: Diagnosis not present

## 2020-08-03 DIAGNOSIS — J449 Chronic obstructive pulmonary disease, unspecified: Secondary | ICD-10-CM | POA: Diagnosis not present

## 2020-08-03 DIAGNOSIS — F17210 Nicotine dependence, cigarettes, uncomplicated: Secondary | ICD-10-CM | POA: Diagnosis not present

## 2020-08-03 DIAGNOSIS — G4733 Obstructive sleep apnea (adult) (pediatric): Secondary | ICD-10-CM | POA: Diagnosis not present

## 2020-08-10 ENCOUNTER — Other Ambulatory Visit (HOSPITAL_COMMUNITY)
Admission: RE | Admit: 2020-08-10 | Discharge: 2020-08-10 | Disposition: A | Discharge status: Home / Self Care | Payer: Medicare Other | Source: Other Acute Inpatient Hospital | Attending: Family Medicine | Admitting: Family Medicine

## 2020-08-10 DIAGNOSIS — I10 Essential (primary) hypertension: Secondary | ICD-10-CM | POA: Diagnosis not present

## 2020-08-10 DIAGNOSIS — T8452XA Infection and inflammatory reaction due to internal left hip prosthesis, initial encounter: Secondary | ICD-10-CM | POA: Diagnosis not present

## 2020-08-10 DIAGNOSIS — G4733 Obstructive sleep apnea (adult) (pediatric): Secondary | ICD-10-CM | POA: Diagnosis not present

## 2020-08-10 DIAGNOSIS — J449 Chronic obstructive pulmonary disease, unspecified: Secondary | ICD-10-CM | POA: Diagnosis not present

## 2020-08-10 DIAGNOSIS — Z452 Encounter for adjustment and management of vascular access device: Secondary | ICD-10-CM | POA: Diagnosis not present

## 2020-08-10 DIAGNOSIS — J189 Pneumonia, unspecified organism: Secondary | ICD-10-CM | POA: Insufficient documentation

## 2020-08-10 DIAGNOSIS — F17210 Nicotine dependence, cigarettes, uncomplicated: Secondary | ICD-10-CM | POA: Diagnosis not present

## 2020-08-10 LAB — C-REACTIVE PROTEIN: CRP: 1.1 mg/dL — ABNORMAL HIGH (ref <1.0)

## 2020-08-10 LAB — HEPATIC FUNCTION PANEL
ALT: 11 U/L (ref 0–44)
AST: 29 U/L (ref 15–41)
Albumin: 3.6 g/dL (ref 3.5–5.0)
Alkaline Phosphatase: 82 U/L (ref 38–126)
Bilirubin, Direct: 0.1 mg/dL (ref 0.0–0.2)
Indirect Bilirubin: 0.2 mg/dL — ABNORMAL LOW (ref 0.3–0.9)
Total Bilirubin: 0.3 mg/dL (ref 0.3–1.2)
Total Protein: 6.2 g/dL — ABNORMAL LOW (ref 6.5–8.1)

## 2020-08-10 LAB — CBC WITH DIFFERENTIAL/PLATELET
Abs Immature Granulocytes: 0.02 10*3/uL (ref 0.00–0.07)
Basophils Absolute: 0.1 10*3/uL (ref 0.0–0.1)
Basophils Relative: 1 %
Eosinophils Absolute: 0.9 10*3/uL — ABNORMAL HIGH (ref 0.0–0.5)
Eosinophils Relative: 13 %
HCT: 25.4 % — ABNORMAL LOW (ref 36.0–46.0)
Hemoglobin: 7.9 g/dL — ABNORMAL LOW (ref 12.0–15.0)
Immature Granulocytes: 0 %
Lymphocytes Relative: 21 %
Lymphs Abs: 1.5 10*3/uL (ref 0.7–4.0)
MCH: 26.7 pg (ref 26.0–34.0)
MCHC: 31.1 g/dL (ref 30.0–36.0)
MCV: 85.8 fL (ref 80.0–100.0)
Monocytes Absolute: 0.7 10*3/uL (ref 0.1–1.0)
Monocytes Relative: 10 %
Neutro Abs: 3.9 10*3/uL (ref 1.7–7.7)
Neutrophils Relative %: 55 %
Platelets: 289 10*3/uL (ref 150–400)
RBC: 2.96 MIL/uL — ABNORMAL LOW (ref 3.87–5.11)
RDW: 16.6 % — ABNORMAL HIGH (ref 11.5–15.5)
WBC: 7 10*3/uL (ref 4.0–10.5)
nRBC: 0 % (ref 0.0–0.2)

## 2020-08-10 LAB — BASIC METABOLIC PANEL
Anion gap: 11 (ref 5–15)
BUN: 29 mg/dL — ABNORMAL HIGH (ref 8–23)
CO2: 23 mmol/L (ref 22–32)
Calcium: 9.1 mg/dL (ref 8.9–10.3)
Chloride: 101 mmol/L (ref 98–111)
Creatinine, Ser: 1.41 mg/dL — ABNORMAL HIGH (ref 0.44–1.00)
GFR, Estimated: 40 mL/min — ABNORMAL LOW (ref >60)
Glucose, Bld: 104 mg/dL — ABNORMAL HIGH (ref 70–99)
Potassium: 4.6 mmol/L (ref 3.5–5.1)
Sodium: 135 mmol/L (ref 135–145)

## 2020-08-10 LAB — SEDIMENTATION RATE: Sed Rate: 9 mm/hr (ref 0–22)

## 2020-08-14 ENCOUNTER — Encounter: Payer: Self-pay | Admitting: Family

## 2020-08-14 ENCOUNTER — Ambulatory Visit (INDEPENDENT_AMBULATORY_CARE_PROVIDER_SITE_OTHER): Payer: Medicare Other | Admitting: Family

## 2020-08-14 ENCOUNTER — Other Ambulatory Visit: Payer: Self-pay

## 2020-08-14 DIAGNOSIS — T8452XA Infection and inflammatory reaction due to internal left hip prosthesis, initial encounter: Secondary | ICD-10-CM | POA: Diagnosis not present

## 2020-08-14 DIAGNOSIS — T8452XD Infection and inflammatory reaction due to internal left hip prosthesis, subsequent encounter: Secondary | ICD-10-CM

## 2020-08-14 DIAGNOSIS — Z8619 Personal history of other infectious and parasitic diseases: Secondary | ICD-10-CM

## 2020-08-14 NOTE — Assessment & Plan Note (Signed)
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  Appears to be healing well and tolerating antibiotics.  Continue current dose of ceftriaxone through 12/27 followed by antibiotic holiday and rechecking inflammatory markers prior to hip reimplantation as indicated by orthopedics.  Plan for follow-up in 2-1/2 weeks or sooner if needed.

## 2020-08-14 NOTE — Patient Instructions (Signed)
Nice to see you.  We will continue your ceftriaxone until 12/27.  We will check on your weekly lab work.  Have a great day and stay safe!  Happy Holidays!

## 2020-08-14 NOTE — Progress Notes (Signed)
Subjective:    Patient ID: Regina Berry, female    DOB: 05-07-1951, 69 y.o.   MRN: [id number]  Chief Complaint  Patient presents with  . Prosthetic Joint Infection     HPI:  Regina Berry is a 69 y.o. female with left hip prosthetic joint infection with cultures positive for E. coli last seen in the office on 06/16/2020 awaiting two-step exchange through [place] who presents today for postsurgical follow-up.  Regina Berry completed the first step of her two-step exchange on 07/20/2020 and was placed on ceftriaxone through 08/31/2020.  Since leaving the hospital she continues to receive the Rocephin daily as prescribed with no adverse side effects or missed doses.  She ambulates with a walker at home and walking with a cane today.  Continues to have pain with movement that is improved with rest.  Has been seen by her surgeon who reports good progress to date.  PICC line infusing with some difficulty with drawing blood.  Most recent dressing change was Monday.  Denies fevers or chills.   Allergies  Allergen Reactions  . Codeine Itching      Outpatient Medications Prior to Visit  Medication Sig Dispense Refill  . baclofen (LIORESAL) 10 MG tablet Take 1 tablet (10 mg total) by mouth 3 (three) times daily. As needed for muscle spasm 30 tablet 0  . buPROPion (WELLBUTRIN XL) 300 MG 24 hr tablet Take 300 mg by mouth daily.    . Calcium-Magnesium-Zinc (CAL-MAG-ZINC PO) Take 1 tablet by mouth daily.    . cephALEXin (KEFLEX) 500 MG capsule Take 1 capsule (500 mg total) by mouth 3 (three) times daily. Start on 06/26/2020 90 capsule 1  . citalopram (CELEXA) 40 MG tablet Take 40 mg by mouth at bedtime.    . fluconazole (DIFLUCAN) 150 MG tablet Take 1 tablet by mouth and may repeat in 72 hours if needed. 2 tablet 1  . fluticasone (FLONASE) 50 MCG/ACT nasal spray Place 2 sprays into both nostrils daily as needed (allergies.).     Marland Kitchen furosemide (LASIX) 20 MG tablet Take 20 mg by mouth  daily as needed (fluid retention.).     Marland Kitchen HYDROcodone-acetaminophen (NORCO) 5-325 MG tablet Take 1 tablet by mouth every 4 (four) hours as needed for moderate pain. MAXIMUM TOTAL ACETAMINOPHEN DOSE IS 4000 MG PER DAY 30 tablet 0  . levocetirizine (XYZAL) 5 MG tablet Take 5 mg by mouth at bedtime.     Marland Kitchen levothyroxine (SYNTHROID) 88 MCG tablet Take 88 mcg by mouth daily before breakfast.     . losartan-hydrochlorothiazide (HYZAAR) 100-25 MG tablet Take 1 tablet by mouth daily.     . Omega-3 Fatty Acids (FISH OIL) 1000 MG CAPS Take 1,000 mg by mouth daily.    . polyethylene glycol powder (MIRALAX) powder Take 17 g by mouth daily. To prevent constipation (Patient taking differently: Take 17 g by mouth daily as needed. To prevent constipation) 255 g prn  . potassium chloride SA (K-DUR) 20 MEQ tablet Take 20 mEq by mouth daily.     . vitamin B-12 (CYANOCOBALAMIN) 1000 MCG tablet Take 1,000 mcg by mouth daily.    Marland Kitchen zolpidem (AMBIEN) 10 MG tablet Take 10 mg by mouth at bedtime as needed for sleep.     Marland Kitchen enoxaparin (LOVENOX) 40 MG/0.4ML injection Inject 0.4 mLs (40 mg total) into the skin daily. (Patient not taking: No sig reported) 12 mL 0   No facility-administered medications prior to visit.     Past  Medical History:  Diagnosis Date  . Anemia   . Anxiety   . Arthritis    Lt hip  . Cancer ([place]) 2020   skin  Rt leg   . Chronic bronchitis ([place])   . COPD (chronic obstructive pulmonary disease) ([place])   . Dyspnea   . GERD (gastroesophageal reflux disease)   . Hypertension   . Hypothyroidism   . Obesity (BMI 30-39.9) 12/21/2017  . OSA on CPAP 12/21/2017   Severe with AHI 29.4/h and oxygen desaturations as low as 75%.  She is now on CPAP at 6 cm water pressure with 2 L oxygen due to nocturnal hypoxemia  . Pelvic pressure in female 01/23/2014  . Positive fecal occult blood test 06/23/2015  . Rectocele 01/23/2014  . 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 Thyroid disease      Past Surgical History:   Procedure Laterality Date  . APPLICATION OF WOUND VAC Left 05/15/2020   Procedure: APPLICATION OF WOUND VAC;  Surgeon: Marchia Bond, MD;  [facility];  Service: Orthopedics;  Laterality: Left;  . CESAREAN SECTION    . COLONOSCOPY N/A 09/11/2015   Procedure: COLONOSCOPY;  Surgeon: Rogene Houston, MD;  [facility];  Service: Endoscopy;  Laterality: N/A;  moved to 1/6 @ 1:35 - Ann to notify pt  . EYE SURGERY Bilateral 2021  . INCISION AND DRAINAGE HIP Left 05/15/2020   Procedure: IRRIGATION AND DEBRIDEMENT HIP LEFT HIP, POSSIBLE POLLY EXCHANGE;  Surgeon: Marchia Bond, MD;  [facility];  Service: Orthopedics;  Laterality: Left;  . LUNG SURGERY Left    infection on outside of lung that had to be"scaped off".  Tildon Husky REPAIR N/A 01/02/2018   Procedure: POSTERIOR REPAIR (RECTOCELE);  Surgeon: Jonnie Kind, MD;  Location: [facility];  Service: Gynecology;  Laterality: N/A;  . TOTAL HIP ARTHROPLASTY Left 03/17/2020   Procedure: TOTAL HIP ARTHROPLASTY;  Surgeon: Marchia Bond, MD;  [facility];  Service: Orthopedics;  Laterality: Left;  . TOTAL HIP ARTHROPLASTY Left 05/13/2020   Procedure: TOTAL HIP ARTHROPLASTY IRRIGATION AND DEBRIDEMENT, POLY EXCHANGE;  Surgeon: Marchia Bond, MD;  [facility];  Service: Orthopedics;  Laterality: Left;       Review of Systems  Constitutional: Negative for chills, diaphoresis, fatigue and fever.  Respiratory: Negative for cough, chest tightness, shortness of breath and wheezing.   Cardiovascular: Negative for chest pain.  Gastrointestinal: Negative for abdominal pain, diarrhea, nausea and vomiting.  Musculoskeletal:       Positive for left hip pain.       Objective:    BP 123/67   Pulse 82   Temp 97.7 F (36.5 C) (Oral)   Wt 195 lb (88.5 kg)   BMI 34.54 kg/m  Nursing note and vital signs reviewed.  Physical Exam Constitutional:      General: She is not in acute distress.    Appearance: She is well-developed and  well-nourished.  Cardiovascular:     Rate and Rhythm: Normal rate and regular rhythm.     Pulses: Intact distal pulses.     Heart sounds: Normal heart sounds.  Pulmonary:     Effort: Pulmonary effort is normal.     Breath sounds: Normal breath sounds.  Skin:    General: Skin is warm and dry.  Neurological:     Mental Status: She is alert and oriented to person, place, and time.  Psychiatric:        Mood and Affect: Mood and affect normal.  Behavior: Behavior normal.        Thought Content: Thought content normal.        Judgment: Judgment normal.      No flowsheet data found.     Assessment & Plan:    Patient Active Problem List   Diagnosis Date Noted  . Left hip postoperative wound infection 05/13/2020  . Infected prosthesis of left hip ([place]) 05/13/2020  . 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 Osteoarthritis of left hip 03/17/2020  . Enterocele 01/02/2018  . OSA on CPAP 12/21/2017  . Obesity (BMI 30-39.9) 12/21/2017  . Depression 08/18/2015  . Positive fecal occult blood test 06/23/2015  . Pelvic pressure in female 01/23/2014  . Rectocele 01/23/2014     Problem List Items Addressed This Visit      Musculoskeletal and Integument   Infected prosthesis of left hip [facility])    Ms. 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  Appears to be healing well and tolerating antibiotics.  Continue current dose of ceftriaxone through 12/27 followed by antibiotic holiday and rechecking inflammatory markers prior to hip reimplantation as indicated by orthopedics.  Plan for follow-up in 2-1/2 weeks or sooner if needed.          I am having Sharrie L. Inboden maintain her furosemide, levothyroxine, zolpidem, losartan-hydrochlorothiazide, Calcium-Magnesium-Zinc (CAL-MAG-ZINC PO), levocetirizine, fluticasone, polyethylene glycol powder, buPROPion, potassium  chloride SA, Fish Oil, vitamin B-12, citalopram, enoxaparin, baclofen, HYDROcodone-acetaminophen, cephALEXin, and fluconazole.    Follow-up: Return in about 2 weeks (around 08/28/2020), or if symptoms worsen or fail to improve.   Terri Piedra, MSN, FNP-C Nurse Practitioner [facility] for Infectious Disease [place] number: [phone]

## 2020-08-17 DIAGNOSIS — J449 Chronic obstructive pulmonary disease, unspecified: Secondary | ICD-10-CM | POA: Diagnosis not present

## 2020-08-17 DIAGNOSIS — I10 Essential (primary) hypertension: Secondary | ICD-10-CM | POA: Diagnosis not present

## 2020-08-17 DIAGNOSIS — Z452 Encounter for adjustment and management of vascular access device: Secondary | ICD-10-CM | POA: Diagnosis not present

## 2020-08-17 DIAGNOSIS — T8452XA Infection and inflammatory reaction due to internal left hip prosthesis, initial encounter: Secondary | ICD-10-CM | POA: Diagnosis not present

## 2020-08-17 DIAGNOSIS — G4733 Obstructive sleep apnea (adult) (pediatric): Secondary | ICD-10-CM | POA: Diagnosis not present

## 2020-08-17 DIAGNOSIS — F17210 Nicotine dependence, cigarettes, uncomplicated: Secondary | ICD-10-CM | POA: Diagnosis not present

## 2020-08-18 DIAGNOSIS — Z452 Encounter for adjustment and management of vascular access device: Secondary | ICD-10-CM | POA: Diagnosis not present

## 2020-08-18 DIAGNOSIS — J449 Chronic obstructive pulmonary disease, unspecified: Secondary | ICD-10-CM | POA: Diagnosis not present

## 2020-08-18 DIAGNOSIS — T8452XA Infection and inflammatory reaction due to internal left hip prosthesis, initial encounter: Secondary | ICD-10-CM | POA: Diagnosis not present

## 2020-08-18 DIAGNOSIS — I10 Essential (primary) hypertension: Secondary | ICD-10-CM | POA: Diagnosis not present

## 2020-08-23 DIAGNOSIS — Z89622 Acquired absence of left hip joint: Secondary | ICD-10-CM | POA: Diagnosis not present

## 2020-08-23 DIAGNOSIS — I10 Essential (primary) hypertension: Secondary | ICD-10-CM | POA: Diagnosis not present

## 2020-08-23 DIAGNOSIS — T8452XA Infection and inflammatory reaction due to internal left hip prosthesis, initial encounter: Secondary | ICD-10-CM | POA: Diagnosis not present

## 2020-08-23 DIAGNOSIS — Z452 Encounter for adjustment and management of vascular access device: Secondary | ICD-10-CM | POA: Diagnosis not present

## 2020-08-23 DIAGNOSIS — G4733 Obstructive sleep apnea (adult) (pediatric): Secondary | ICD-10-CM | POA: Diagnosis not present

## 2020-08-23 DIAGNOSIS — J449 Chronic obstructive pulmonary disease, unspecified: Secondary | ICD-10-CM | POA: Diagnosis not present

## 2020-08-23 DIAGNOSIS — Z7982 Long term (current) use of aspirin: Secondary | ICD-10-CM | POA: Diagnosis not present

## 2020-08-23 DIAGNOSIS — Z792 Long term (current) use of antibiotics: Secondary | ICD-10-CM | POA: Diagnosis not present

## 2020-08-23 DIAGNOSIS — Z9981 Dependence on supplemental oxygen: Secondary | ICD-10-CM | POA: Diagnosis not present

## 2020-08-23 DIAGNOSIS — Z79891 Long term (current) use of opiate analgesic: Secondary | ICD-10-CM | POA: Diagnosis not present

## 2020-08-23 DIAGNOSIS — F17210 Nicotine dependence, cigarettes, uncomplicated: Secondary | ICD-10-CM | POA: Diagnosis not present

## 2020-08-24 DIAGNOSIS — I10 Essential (primary) hypertension: Secondary | ICD-10-CM | POA: Diagnosis not present

## 2020-08-24 DIAGNOSIS — F17210 Nicotine dependence, cigarettes, uncomplicated: Secondary | ICD-10-CM | POA: Diagnosis not present

## 2020-08-24 DIAGNOSIS — J449 Chronic obstructive pulmonary disease, unspecified: Secondary | ICD-10-CM | POA: Diagnosis not present

## 2020-08-24 DIAGNOSIS — G4733 Obstructive sleep apnea (adult) (pediatric): Secondary | ICD-10-CM | POA: Diagnosis not present

## 2020-08-24 DIAGNOSIS — T8452XA Infection and inflammatory reaction due to internal left hip prosthesis, initial encounter: Secondary | ICD-10-CM | POA: Diagnosis not present

## 2020-08-24 DIAGNOSIS — Z452 Encounter for adjustment and management of vascular access device: Secondary | ICD-10-CM | POA: Diagnosis not present

## 2020-08-30 ENCOUNTER — Other Ambulatory Visit: Payer: Self-pay

## 2020-08-30 ENCOUNTER — Emergency Department (HOSPITAL_COMMUNITY)
Admission: EM | Admit: 2020-08-30 | Discharge: 2020-08-30 | Disposition: A | Discharge status: Home / Self Care | Payer: Medicare Other | Attending: Emergency Medicine | Admitting: Emergency Medicine

## 2020-08-30 ENCOUNTER — Emergency Department (HOSPITAL_COMMUNITY): Payer: Medicare Other

## 2020-08-30 ENCOUNTER — Encounter (HOSPITAL_COMMUNITY): Payer: Self-pay | Admitting: Emergency Medicine

## 2020-08-30 DIAGNOSIS — M7989 Other specified soft tissue disorders: Secondary | ICD-10-CM | POA: Diagnosis not present

## 2020-08-30 DIAGNOSIS — F17210 Nicotine dependence, cigarettes, uncomplicated: Secondary | ICD-10-CM | POA: Diagnosis not present

## 2020-08-30 DIAGNOSIS — E039 Hypothyroidism, unspecified: Secondary | ICD-10-CM | POA: Diagnosis not present

## 2020-08-30 DIAGNOSIS — Z471 Aftercare following joint replacement surgery: Secondary | ICD-10-CM | POA: Diagnosis not present

## 2020-08-30 DIAGNOSIS — M25552 Pain in left hip: Secondary | ICD-10-CM

## 2020-08-30 DIAGNOSIS — R52 Pain, unspecified: Secondary | ICD-10-CM

## 2020-08-30 DIAGNOSIS — R0989 Other specified symptoms and signs involving the circulatory and respiratory systems: Secondary | ICD-10-CM | POA: Diagnosis not present

## 2020-08-30 DIAGNOSIS — Z85828 Personal history of other malignant neoplasm of skin: Secondary | ICD-10-CM | POA: Diagnosis not present

## 2020-08-30 DIAGNOSIS — Z96642 Presence of left artificial hip joint: Secondary | ICD-10-CM | POA: Insufficient documentation

## 2020-08-30 DIAGNOSIS — Z7901 Long term (current) use of anticoagulants: Secondary | ICD-10-CM | POA: Diagnosis not present

## 2020-08-30 DIAGNOSIS — J449 Chronic obstructive pulmonary disease, unspecified: Secondary | ICD-10-CM | POA: Insufficient documentation

## 2020-08-30 DIAGNOSIS — I517 Cardiomegaly: Secondary | ICD-10-CM | POA: Diagnosis not present

## 2020-08-30 DIAGNOSIS — I10 Essential (primary) hypertension: Secondary | ICD-10-CM | POA: Insufficient documentation

## 2020-08-30 DIAGNOSIS — R2243 Localized swelling, mass and lump, lower limb, bilateral: Secondary | ICD-10-CM | POA: Diagnosis not present

## 2020-08-30 DIAGNOSIS — Z79899 Other long term (current) drug therapy: Secondary | ICD-10-CM | POA: Insufficient documentation

## 2020-08-30 LAB — COMPREHENSIVE METABOLIC PANEL WITH GFR
ALT: 12 U/L (ref 0–44)
AST: 29 U/L (ref 15–41)
Albumin: 3.6 g/dL (ref 3.5–5.0)
Alkaline Phosphatase: 64 U/L (ref 38–126)
Anion gap: 7 (ref 5–15)
BUN: 30 mg/dL — ABNORMAL HIGH (ref 8–23)
CO2: 29 mmol/L (ref 22–32)
Calcium: 9.4 mg/dL (ref 8.9–10.3)
Chloride: 102 mmol/L (ref 98–111)
Creatinine, Ser: 1.14 mg/dL — ABNORMAL HIGH (ref 0.44–1.00)
GFR, Estimated: 52 mL/min — ABNORMAL LOW
Glucose, Bld: 99 mg/dL (ref 70–99)
Potassium: 4 mmol/L (ref 3.5–5.1)
Sodium: 138 mmol/L (ref 135–145)
Total Bilirubin: 0.3 mg/dL (ref 0.3–1.2)
Total Protein: 6.4 g/dL — ABNORMAL LOW (ref 6.5–8.1)

## 2020-08-30 LAB — CBC WITH DIFFERENTIAL/PLATELET
Abs Immature Granulocytes: 0.02 K/uL (ref 0.00–0.07)
Basophils Absolute: 0 K/uL (ref 0.0–0.1)
Basophils Relative: 1 %
Eosinophils Absolute: 0.3 K/uL (ref 0.0–0.5)
Eosinophils Relative: 5 %
HCT: 28.3 % — ABNORMAL LOW (ref 36.0–46.0)
Hemoglobin: 8.5 g/dL — ABNORMAL LOW (ref 12.0–15.0)
Immature Granulocytes: 0 %
Lymphocytes Relative: 15 %
Lymphs Abs: 0.7 K/uL (ref 0.7–4.0)
MCH: 26 pg (ref 26.0–34.0)
MCHC: 30 g/dL (ref 30.0–36.0)
MCV: 86.5 fL (ref 80.0–100.0)
Monocytes Absolute: 0.5 K/uL (ref 0.1–1.0)
Monocytes Relative: 10 %
Neutro Abs: 3.5 K/uL (ref 1.7–7.7)
Neutrophils Relative %: 69 %
Platelets: 233 K/uL (ref 150–400)
RBC: 3.27 MIL/uL — ABNORMAL LOW (ref 3.87–5.11)
RDW: 15.9 % — ABNORMAL HIGH (ref 11.5–15.5)
WBC: 5 K/uL (ref 4.0–10.5)
nRBC: 0 % (ref 0.0–0.2)

## 2020-08-30 LAB — C-REACTIVE PROTEIN: CRP: 0.6 mg/dL (ref <1.0)

## 2020-08-30 LAB — SEDIMENTATION RATE: Sed Rate: 30 mm/hr — ABNORMAL HIGH (ref 0–22)

## 2020-08-30 MED ORDER — MORPHINE SULFATE (PF) 4 MG/ML IV SOLN
4.0000 mg | Freq: Once | INTRAVENOUS | Status: AC
  Administered 2020-08-30: 4 mg via INTRAVENOUS
  Filled 2020-08-30: qty 1

## 2020-08-30 NOTE — Discharge Instructions (Addendum)
Seen here for hip pain.  Lab work and imaging looks reassuring.  I recommend continuing with your medications as prescribed.  You may take ibuprofen and or Tylenol every 6 as needed for pain control please follow dosing the back of bottle.  I recommend giving your hip some rest as this can help decrease pain  Your lab work shows slightly elevated creatinine level, I recommend discontinuing your Lasix after today and following up with your primary care provider to see if they would like to repeat your BMP and recheck your kidneys.  I recommend following up with your orthopedic surgeon for further recommendations about your left hip.  Come back to the emergency department if you develop chest pain, shortness of breath, severe abdominal pain, uncontrolled nausea, vomiting, diarrhea.

## 2020-08-30 NOTE — ED Notes (Signed)
Pt wheeled to waiting room. Pt verbalized understanding of discharge instructions.   

## 2020-08-30 NOTE — ED Provider Notes (Signed)
[facility] EMERGENCY DEPARTMENT Provider Note   CSN: [id number] Arrival date & time: 08/30/20  0840     History Chief Complaint  Patient presents with  . Hip Pain    Regina Berry is a 69 y.o. female.  HPI   Patient with significant medical history of COPD, hypertension, arthritis presents to the emergency department with chief complaint of left hip pain.  Patient states pain started suddenly this morning, she states she has severe pain in her left hip when she moves it, she states the pain stays right in her hip, does not radiate, she denies paresthesias or weakness in her legs, she does endorse some abnormal leg swelling and had to double her lasix.  She denies recent trauma to the area, denies recent falls, she states generally she can walk with a walker but today is unable to due to pain.  She denies urinary symptoms, vaginal discharge, vaginal bleeding, abdominal pain, flank pain, nausea, vomiting, diarrhea, fevers or chills.    Patient's chart was reviewed she has had extensive surgery of her left hip, notably she had a total hip replacement done in June and unfortunately hip became infected with E. coli, she went for debridement in September and eventually had hardware removed in November in [place].  She has been followed by Dr. Darcus Austin of [place] who saw her on 12/7 and noted good healing of her hip will see her in January.  she is also been followed by infectious disease last seen by Mauricio Po FNP, patient has been on Rocephin since November and currently has a PICC line in her left arm, final dose will be today.  Patient denies alleviating factors.  Denies headaches, fevers, chills, shortness of breath, chest pain, abdominal pain, nausea, vomiting, diarrhea.  Past Medical History:  Diagnosis Date  . Anemia   . Anxiety   . Arthritis    Lt hip  . Cancer ([place]) 2020   skin  Rt leg   . Chronic bronchitis ([place])   . COPD (chronic obstructive pulmonary disease)  ([place])   . Dyspnea   . GERD (gastroesophageal reflux disease)   . Hypertension   . Hypothyroidism   . Obesity (BMI 30-39.9) 12/21/2017  . OSA on CPAP 12/21/2017   Severe with AHI 29.4/h and oxygen desaturations as low as 75%.  She is now on CPAP at 6 cm water pressure with 2 L oxygen due to nocturnal hypoxemia  . Pelvic pressure in female 01/23/2014  . Positive fecal occult blood test 06/23/2015  . Rectocele 01/23/2014  . 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 Thyroid disease     Patient Active Problem List   Diagnosis Date Noted  . Left hip postoperative wound infection 05/13/2020  . 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 Osteoarthritis of left hip 03/17/2020  . Enterocele 01/02/2018  . OSA on CPAP 12/21/2017  . Obesity (BMI 30-39.9) 12/21/2017  . Depression 08/18/2015  . Positive fecal occult blood test 06/23/2015  . Pelvic pressure in female 01/23/2014  . Rectocele 01/23/2014    Past Surgical History:  Procedure Laterality Date  . APPLICATION OF WOUND VAC Left 05/15/2020   Procedure: APPLICATION OF WOUND VAC;  Surgeon: Marchia Bond, MD;  Location: [facility] ORS;  Service: Orthopedics;  Laterality: Left;  . CESAREAN SECTION    . COLONOSCOPY N/A 09/11/2015   Procedure: COLONOSCOPY;  Surgeon: Rogene Houston, MD;  Location: [facility] SUITE;  Service:  Endoscopy;  Laterality: N/A;  moved to 1/6 @ 1:35 - Ann to notify pt  . EYE SURGERY Bilateral 2021  . INCISION AND DRAINAGE HIP Left 05/15/2020   Procedure: IRRIGATION AND DEBRIDEMENT HIP LEFT HIP, POSSIBLE POLLY EXCHANGE;  Surgeon: Marchia Bond, MD;  Location: [facility];  Service: Orthopedics;  Laterality: Left;  . LUNG SURGERY Left    infection on outside of lung that had to be"scaped off".  Tildon Husky REPAIR N/A 01/02/2018   Procedure: POSTERIOR REPAIR (RECTOCELE);  Surgeon: Jonnie Kind, MD;  Location: [facility] ORS;  Service: Gynecology;  Laterality: N/A;  . TOTAL HIP ARTHROPLASTY  Left 03/17/2020   Procedure: TOTAL HIP ARTHROPLASTY;  Surgeon: Marchia Bond, MD;  Location: [facility];  Service: Orthopedics;  Laterality: Left;  . TOTAL HIP ARTHROPLASTY Left 05/13/2020   Procedure: TOTAL HIP ARTHROPLASTY IRRIGATION AND DEBRIDEMENT, POLY EXCHANGE;  Surgeon: Marchia Bond, MD;  Location: [facility];  Service: Orthopedics;  Laterality: Left;     OB History    Gravida  2   Para  2   Term      Preterm      AB      Living  2     SAB      IAB      Ectopic      Multiple      Live Births  2           Family History  Problem Relation Age of Onset  . Diabetes Mother   . Cancer Mother        ovarian  . COPD Mother   . Diabetes Father   . Cancer Father        lymphoma  . Heart disease Father   . Other Daughter        transverse mylitis    Social History   Tobacco Use  . Smoking status: Current Every Day Smoker    Packs/day: 1.00    Years: 40.00    Pack years: 40.00    Types: Cigarettes  . Smokeless tobacco: Never Used  Substance Use Topics  . Alcohol use: Yes    Alcohol/week: 0.0 standard drinks    Comment: occasionally  . Drug use: No    Home Medications Prior to Admission medications   Medication Sig Start Date End Date Taking? Authorizing Provider  baclofen (LIORESAL) 10 MG tablet Take 1 tablet (10 mg total) by mouth 3 (three) times daily. As needed for muscle spasm 05/15/20   Merlene Pulling K, PA-C  buPROPion (WELLBUTRIN XL) 300 MG 24 hr tablet Take 300 mg by mouth daily. 03/13/19   [provider]  Calcium-Magnesium-Zinc (CAL-MAG-ZINC PO) Take 1 tablet by mouth daily.    [provider]  cephALEXin (KEFLEX) 500 MG capsule Take 1 capsule (500 mg total) by mouth 3 (three) times daily. Start on 06/26/2020 06/16/20   Golden Circle, FNP  citalopram (CELEXA) 40 MG tablet Take 40 mg by mouth at bedtime.    [provider]  enoxaparin (LOVENOX) 40 MG/0.4ML injection Inject 0.4 mLs (40 mg total) into the skin  daily. Patient not taking: No sig reported 05/15/20   Merlene Pulling K, PA-C  fluconazole (DIFLUCAN) 150 MG tablet Take 1 tablet by mouth and may repeat in 72 hours if needed. 06/26/20   Golden Circle, FNP  fluticasone (FLONASE) 50 MCG/ACT nasal spray Place 2 sprays into both nostrils daily as needed (allergies.).     [provider]  furosemide (LASIX) 20  MG tablet Take 20 mg by mouth daily as needed (fluid retention.).     [provider]  HYDROcodone-acetaminophen (NORCO) 5-325 MG tablet Take 1 tablet by mouth every 4 (four) hours as needed for moderate pain. MAXIMUM TOTAL ACETAMINOPHEN DOSE IS 4000 MG PER DAY 05/15/20   Merlene Pulling K, PA-C  levocetirizine (XYZAL) 5 MG tablet Take 5 mg by mouth at bedtime.     [provider]  levothyroxine (SYNTHROID) 88 MCG tablet Take 88 mcg by mouth daily before breakfast.     [provider]  losartan-hydrochlorothiazide (HYZAAR) 100-25 MG tablet Take 1 tablet by mouth daily.     [provider]  Omega-3 Fatty Acids (FISH OIL) 1000 MG CAPS Take 1,000 mg by mouth daily.    [provider]  polyethylene glycol powder (MIRALAX) powder Take 17 g by mouth daily. To prevent constipation Patient taking differently: Take 17 g by mouth daily as needed. To prevent constipation 01/02/18   Jonnie Kind, MD  potassium chloride SA (K-DUR) 20 MEQ tablet Take 20 mEq by mouth daily.     [provider]  vitamin B-12 (CYANOCOBALAMIN) 1000 MCG tablet Take 1,000 mcg by mouth daily.    [provider]  zolpidem (AMBIEN) 10 MG tablet Take 10 mg by mouth at bedtime as needed for sleep.     [provider]    Allergies    Codeine  Review of Systems   Review of Systems  Constitutional: Negative for chills and fever.  HENT: Negative for congestion.   Respiratory: Negative for shortness of breath.   Cardiovascular: Negative for chest pain.  Gastrointestinal: Negative for abdominal pain,  diarrhea, nausea and vomiting.  Genitourinary: Negative for dysuria, enuresis and flank pain.  Musculoskeletal: Negative for back pain.       Left hip pain.  Skin: Negative for rash.  Neurological: Negative for headaches.  Hematological: Does not bruise/bleed easily.    Physical Exam Updated Vital Signs BP (!) 128/58 (BP Location: Right Arm)   Pulse 71   Temp 98.8 F (37.1 C) (Oral)   Resp 16   Ht 5\' 3"  (1.6 m)   Wt 87.5 kg   SpO2 95%   BMI 34.19 kg/m   Physical Exam Vitals and nursing note reviewed.  Constitutional:      General: She is not in acute distress.    Appearance: She is not ill-appearing.  HENT:     Head: Normocephalic and atraumatic.     Nose: No congestion.  Eyes:     Conjunctiva/sclera: Conjunctivae normal.  Cardiovascular:     Rate and Rhythm: Normal rate and regular rhythm.     Pulses: Normal pulses.     Heart sounds: No murmur heard. No friction rub. No gallop.   Pulmonary:     Effort: No respiratory distress.     Breath sounds: No wheezing, rhonchi or rales.     Comments: Patient has noted bibasilar Rales, no rhonchi, stridor, wheezing heard on exam. Abdominal:     Palpations: Abdomen is soft.     Tenderness: There is no abdominal tenderness.  Musculoskeletal:     Cervical back: Normal range of motion. No rigidity.     Right lower leg: Edema present.     Left lower leg: Edema present.     Comments: Patient is a hip was visualized, she has a 20 cm surgical scar from her left trochanter down to her mid femur, wound is healing well, no signs of infection,  no induration or fluctuance felt.  There is no crepitus noted on my exam.  She has limited range of motion at her hip with flexion and extension due to pain, she is able to bend her knee ankle and toes without difficulty.      She is noted to have 1+ edema bilaterally up into the shins neurovascularly intact  Skin:    General: Skin is warm and dry.  Neurological:     Mental Status: She is alert.   Psychiatric:        Mood and Affect: Mood normal.     ED Results / Procedures / Treatments   Labs (all labs ordered are listed, but only abnormal results are displayed) Labs Reviewed  COMPREHENSIVE METABOLIC PANEL - Abnormal; Notable for the following components:      Result Value   BUN 30 (*)    Creatinine, Ser 1.14 (*)    Total Protein 6.4 (*)    GFR, Estimated 52 (*)    All other components within normal limits  CBC WITH DIFFERENTIAL/PLATELET - Abnormal; Notable for the following components:   RBC 3.27 (*)    Hemoglobin 8.5 (*)    HCT 28.3 (*)    RDW 15.9 (*)    All other components within normal limits  SEDIMENTATION RATE - Abnormal; Notable for the following components:   Sed Rate 30 (*)    All other components within normal limits  C-REACTIVE PROTEIN    EKG None  Radiology DG Chest 1 View  Result Date: 08/30/2020 CLINICAL DATA:  Basilar crackles EXAM: CHEST  1 VIEW COMPARISON:  01/04/2020 FINDINGS: Cardiomegaly. Left upper extremity PICC, tip position near the confluence of the superior vena cava. Diffuse bilateral interstitial opacity and pulmonary vascular prominence. The visualized skeletal structures are unremarkable. IMPRESSION: Cardiomegaly with diffuse bilateral interstitial opacity and pulmonary vascular prominence, findings consistent with pulmonary edema. No focal airspace opacity. Electronically Signed   By: Eddie Candle M.D.   On: 08/30/2020 10:21   US Venous Img Lower Bilateral  Result Date: 08/30/2020 CLINICAL DATA:  Bilateral lower extremity swelling. EXAM: BILATERAL LOWER EXTREMITY VENOUS DOPPLER ULTRASOUND TECHNIQUE: Gray-scale sonography with compression, as well as color and duplex ultrasound, were performed to evaluate the deep venous system(s) from the level of the common femoral vein through the popliteal and proximal calf veins. COMPARISON:  None. FINDINGS: VENOUS Normal compressibility of the common femoral, superficial femoral, and popliteal  veins, as well as the visualized calf veins. Visualized portions of profunda femoral vein and great saphenous vein unremarkable. No filling defects to suggest DVT on grayscale or color Doppler imaging. Doppler waveforms show normal direction of venous flow, normal respiratory plasticity and response to augmentation. OTHER None. Limitations: none IMPRESSION: No findings to suggest DVT in either lower extremity. Electronically Signed   By: Misty Stanley M.D.   On: 08/30/2020 10:49   DG HIP UNILAT WITH PELVIS 2-3 VIEWS LEFT  Result Date: 08/30/2020 CLINICAL DATA:  Left-sided hip pain, no known injury, initial encounter EXAM: DG HIP (WITH OR WITHOUT PELVIS) 2-3V LEFT COMPARISON:  05/15/2020 FINDINGS: Pelvic ring is intact. There is been interval exchange of left hip prosthesis. Considerable periprosthetic cement is noted. No acute fracture or dislocation is noted. No soft tissue abnormality is seen. IMPRESSION: Interval exchange of hip prosthesis. Electronically Signed   By: Inez Catalina M.D.   On: 08/30/2020 10:20    Procedures Procedures (including critical care time)  Medications Ordered in ED Medications  morphine 4 MG/ML injection  4 mg (4 mg Intravenous Given 08/30/20 [id number])    ED Course  I have reviewed the triage vital signs and the nursing notes.  Pertinent labs & imaging results that were available during my care of the patient were reviewed by me and considered in my medical decision making (see chart for details).    MDM Rules/Calculators/A&P                          Patient presents with left hip pain.  She is alert, does not appear in acute distress, vital signs reassuring.  Will obtain basic lab work, add on sed rate and C-reactive protein for concerns of possible infection, will obtain imaging of her hip as well as get DVT study as she has noted bilateral edema.  Will provide patient with pain medications and reevaluate.  Patient is reevaluated, she states she is feeling much  better, has no other complaints at this time.  Vital signs remained stable.  CBC shows no signs of leukocytosis, shows normocytic anemia appears to be a baseline for patient.  CMP shows no electrolyte abnormalities, slight elevation in BUN, slight elevation creatinine 1.14, no elevated liver enzymes, no anion gap present.  C-reactive protein 0.6, sed rate elevated at 30.  DVT study is negative.  Chest x-ray shows some opacities likely secondary to pulmonary edema left hip x-ray shows interval exchange of hip prosthesis no other acute abnormalities  Low suspicion for systemic infection as patient is nontoxic-appearing, vital signs reassuring, no obvious source infection on my exam.  Low suspicion for new fracture or dislocation of patient's left hip as there is no acute findings seen on x-ray.  Low suspicion for osteomyelitis, abscess, overlying cellulitis as area was visualized there is no signs of infection, no fluctuance or indurations felt on exam imaging was unremarkable.  Low suspicion for DVT as study was negative.  Will defer providing patient with IV Lasix at this time as patient has slightly elevated creatinine, there is minimal swelling in her extremities, with no new oxygen requirements.  I recommend patient follows up with her primary care provider for further management.  I suspect patient's hip pain may be secondary to overuse and will recommend that she follows up with her orthopedist for further evaluation.  Vital signs have remained stable, no indication for hospital admission.  Patient discussed with attending and they agreed with assessment and plan.  Patient given at home care as well strict return precautions.  Patient verbalized that they understood agreed to said plan.      Final Clinical Impression(s) / ED Diagnoses Final diagnoses:  Left hip pain    Rx / DC Orders ED Discharge Orders    None       Marcello Fennel, PA-C 08/30/20 1414    Fredia Sorrow,  MD 09/14/20 0021

## 2020-08-30 NOTE — ED Triage Notes (Signed)
Pt reports chronic left hip pain. Pt is normally ambulatory with walker. Pt states this morning she was unable to move and the pain was worse.

## 2020-08-31 DIAGNOSIS — F17210 Nicotine dependence, cigarettes, uncomplicated: Secondary | ICD-10-CM | POA: Diagnosis not present

## 2020-08-31 DIAGNOSIS — G4733 Obstructive sleep apnea (adult) (pediatric): Secondary | ICD-10-CM | POA: Diagnosis not present

## 2020-08-31 DIAGNOSIS — Z452 Encounter for adjustment and management of vascular access device: Secondary | ICD-10-CM | POA: Diagnosis not present

## 2020-08-31 DIAGNOSIS — I10 Essential (primary) hypertension: Secondary | ICD-10-CM | POA: Diagnosis not present

## 2020-08-31 DIAGNOSIS — T8452XA Infection and inflammatory reaction due to internal left hip prosthesis, initial encounter: Secondary | ICD-10-CM | POA: Diagnosis not present

## 2020-08-31 DIAGNOSIS — J449 Chronic obstructive pulmonary disease, unspecified: Secondary | ICD-10-CM | POA: Diagnosis not present

## 2020-09-01 ENCOUNTER — Ambulatory Visit (INDEPENDENT_AMBULATORY_CARE_PROVIDER_SITE_OTHER): Payer: Medicare Other | Admitting: Family

## 2020-09-01 ENCOUNTER — Encounter: Payer: Self-pay | Admitting: Family

## 2020-09-01 ENCOUNTER — Other Ambulatory Visit: Payer: Self-pay

## 2020-09-01 VITALS — BP 106/69 | HR 72 | Temp 98.3°F | Ht 64.0 in | Wt 200.0 lb

## 2020-09-01 DIAGNOSIS — F17210 Nicotine dependence, cigarettes, uncomplicated: Secondary | ICD-10-CM | POA: Diagnosis not present

## 2020-09-01 DIAGNOSIS — T8452XA Infection and inflammatory reaction due to internal left hip prosthesis, initial encounter: Secondary | ICD-10-CM | POA: Diagnosis not present

## 2020-09-01 DIAGNOSIS — Z452 Encounter for adjustment and management of vascular access device: Secondary | ICD-10-CM | POA: Diagnosis not present

## 2020-09-01 DIAGNOSIS — J449 Chronic obstructive pulmonary disease, unspecified: Secondary | ICD-10-CM | POA: Diagnosis not present

## 2020-09-01 DIAGNOSIS — T8452XD Infection and inflammatory reaction due to internal left hip prosthesis, subsequent encounter: Secondary | ICD-10-CM

## 2020-09-01 DIAGNOSIS — I10 Essential (primary) hypertension: Secondary | ICD-10-CM | POA: Diagnosis not present

## 2020-09-01 DIAGNOSIS — G4733 Obstructive sleep apnea (adult) (pediatric): Secondary | ICD-10-CM | POA: Diagnosis not present

## 2020-09-01 NOTE — Assessment & Plan Note (Signed)
Regina Berry has completed 6 weeks of ceftriaxone via PICC following antibiotic spacer placement in two-step exchange.  PICC line is removed with no complications by home health.  Inflammatory markers within normal ranges.  Discussed plan of care to include follow-up with orthopedic surgery on January 18 with antibiotic holiday until that time when they will determine the appropriateness/timing for new hip placement.  Follow-up with ID as needed pending two-step exchange.

## 2020-09-01 NOTE — Progress Notes (Signed)
Subjective:    Patient ID: Regina Berry, female    DOB: December 24, 1950, 69 y.o.   MRN: [id number]  Chief Complaint  Patient presents with  . Follow-up     HPI:  Regina Berry is a 69 y.o. 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  In the interim she has been seen in the ED on 12/26 for increased hip pain which has significantly improved.  Here today for routine follow-up.  Ms. Kondo completed her ceftriaxone yesterday and her PICC line was removed today.  She has no complications from PICC line removal no adverse side effects from ceftriaxone.  Continues to have mild hip pain and is eager to have her new hip joint replaced.  She has an appointment with orthopedic surgery on January 18.  Denies fevers and chills.  Allergies  Allergen Reactions  . Codeine Itching      Outpatient Medications Prior to Visit  Medication Sig Dispense Refill  . buPROPion (WELLBUTRIN XL) 300 MG 24 hr tablet Take 300 mg by mouth daily.    . Calcium-Magnesium-Zinc (CAL-MAG-ZINC PO) Take 1 tablet by mouth daily.    . citalopram (CELEXA) 40 MG tablet Take 40 mg by mouth at bedtime.    . fluticasone (FLONASE) 50 MCG/ACT nasal spray Place 2 sprays into both nostrils daily as needed (allergies.).     Marland Kitchen furosemide (LASIX) 20 MG tablet Take 20 mg by mouth daily as needed (fluid retention.).     Marland Kitchen HYDROcodone-acetaminophen (NORCO) 5-325 MG tablet Take 1 tablet by mouth every 4 (four) hours as needed for moderate pain. MAXIMUM TOTAL ACETAMINOPHEN DOSE IS 4000 MG PER DAY 30 tablet 0  . levothyroxine (SYNTHROID) 88 MCG tablet Take 88 mcg by mouth daily before breakfast.     . losartan-hydrochlorothiazide (HYZAAR) 100-25 MG tablet Take 1 tablet by mouth daily.     Marland Kitchen oxyCODONE (OXY IR/ROXICODONE) 5 MG immediate release tablet Take by mouth.    . polyethylene glycol powder  (MIRALAX) powder Take 17 g by mouth daily. To prevent constipation (Patient taking differently: Take 17 g by mouth daily as needed. To prevent constipation) 255 g prn  . potassium chloride SA (K-DUR) 20 MEQ tablet Take 20 mEq by mouth daily.     . traMADol (ULTRAM) 50 MG tablet Take 50 mg by mouth every 6 (six) hours as needed.    . zolpidem (AMBIEN) 10 MG tablet Take 10 mg by mouth at bedtime as needed for sleep.     . baclofen (LIORESAL) 10 MG tablet Take 1 tablet (10 mg total) by mouth 3 (three) times daily. As needed for muscle spasm (Patient not taking: Reported on 09/01/2020) 30 tablet 0  . cephALEXin (KEFLEX) 500 MG capsule Take 1 capsule (500 mg total) by mouth 3 (three) times daily. Start on 06/26/2020 (Patient not taking: Reported on 09/01/2020) 90 capsule 1  . enoxaparin (LOVENOX) 40 MG/0.4ML injection Inject 0.4 mLs (40 mg total) into the skin daily. (Patient not taking: No sig reported) 12 mL 0  . fluconazole (DIFLUCAN) 150 MG tablet Take 1 tablet by mouth and may repeat in 72 hours if needed. (Patient not taking: Reported on 09/01/2020) 2 tablet 1  . levocetirizine (XYZAL) 5 MG tablet Take 5 mg by mouth at bedtime.  (Patient not taking: Reported on 09/01/2020)    . Omega-3 Fatty Acids (FISH OIL) 1000 MG  CAPS Take 1,000 mg by mouth daily. (Patient not taking: Reported on 09/01/2020)    . vitamin B-12 (CYANOCOBALAMIN) 1000 MCG tablet Take 1,000 mcg by mouth daily. (Patient not taking: Reported on 09/01/2020)     No facility-administered medications prior to visit.     Past Medical History:  Diagnosis Date  . Anemia   . Anxiety   . Arthritis    Lt hip  . Cancer (HCC) 2020   skin  Rt leg   . Chronic bronchitis (HCC)   . COPD (chronic obstructive pulmonary disease) (HCC)   . Dyspnea   . GERD (gastroesophageal reflux disease)   . Hypertension   . Hypothyroidism   . Obesity (BMI 30-39.9) 12/21/2017  . OSA on CPAP 12/21/2017   Severe with AHI 29.4/h and oxygen desaturations as low  as 75%.  She is now on CPAP at 6 cm water pressure with 2 L oxygen due to nocturnal hypoxemia  . Pelvic pressure in female 01/23/2014  . Positive fecal occult blood test 06/23/2015  . Rectocele 01/23/2014  . Scleroderma, limited (HCC)   . Thyroid disease      Past Surgical History:  Procedure Laterality Date  . APPLICATION OF WOUND VAC Left 05/15/2020   Procedure: APPLICATION OF WOUND VAC;  Surgeon: Teryl Lucy, MD;  [facility]: [facility];  Service: Orthopedics;  Laterality: Left;  . CESAREAN SECTION    . COLONOSCOPY N/A 09/11/2015   Procedure: COLONOSCOPY;  Surgeon: Malissa Hippo, MD;  [facility];  Service: Endoscopy;  Laterality: N/A;  moved to 1/6 @ 1:35 - Ann to notify pt  . EYE SURGERY Bilateral 2021  . INCISION AND DRAINAGE HIP Left 05/15/2020   Procedure: IRRIGATION AND DEBRIDEMENT HIP LEFT HIP, POSSIBLE POLLY EXCHANGE;  Surgeon: Teryl Lucy, MD;  [facility];  Service: Orthopedics;  Laterality: Left;  . LUNG SURGERY Left    infection on outside of lung that had to be"scaped off".  [employer] REPAIR N/A 01/02/2018   Procedure: POSTERIOR REPAIR (RECTOCELE);  Surgeon: Tilda Burrow, MD;  Location: [facility];  Service: Gynecology;  Laterality: N/A;  . TOTAL HIP ARTHROPLASTY Left 03/17/2020   Procedure: TOTAL HIP ARTHROPLASTY;  Surgeon: Teryl Lucy, MD;  [facility];  Service: Orthopedics;  Laterality: Left;  . TOTAL HIP ARTHROPLASTY Left 05/13/2020   Procedure: TOTAL HIP ARTHROPLASTY IRRIGATION AND DEBRIDEMENT, POLY EXCHANGE;  Surgeon: Teryl Lucy, MD;  [facility];  Service: Orthopedics;  Laterality: Left;       Review of Systems  Constitutional: Negative for chills, diaphoresis, fatigue and fever.  Respiratory: Negative for cough, chest tightness, shortness of breath and wheezing.   Cardiovascular: Negative for chest pain.  Gastrointestinal: Negative for abdominal pain, diarrhea, nausea and vomiting.  Musculoskeletal:       Positive for left hip  pain      Objective:    BP 106/69   Pulse 72   Temp 98.3 F (36.8 C)   Ht 5\' 4"  (1.626 m)   Wt 200 lb (90.7 kg)   SpO2 95%   BMI 34.33 kg/m  Nursing note and vital signs reviewed.  Physical Exam Constitutional:      General: She is not in acute distress.    Appearance: She is well-developed and well-nourished.  Cardiovascular:     Rate and Rhythm: Normal rate and regular rhythm.     Pulses: Intact distal pulses.     Heart sounds: Normal heart sounds.  Pulmonary:     Effort:  Pulmonary effort is normal.     Breath sounds: Normal breath sounds.  Skin:    General: Skin is warm and dry.  Neurological:     Mental Status: She is alert and oriented to person, place, and time.  Psychiatric:        Mood and Affect: Mood and affect normal.        Behavior: Behavior normal.        Thought Content: Thought content normal.        Judgment: Judgment normal.      Depression screen PHQ 2/9 09/01/2020  Decreased Interest 0  Down, Depressed, Hopeless 0  PHQ - 2 Score 0       Assessment & Plan:    Patient Active Problem List   Diagnosis Date Noted  . Left hip postoperative wound infection 05/13/2020  . Infected prosthesis of left hip (HCC) 05/13/2020  . 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 Osteoarthritis of left hip 03/17/2020  . Enterocele 01/02/2018  . OSA on CPAP 12/21/2017  . Obesity (BMI 30-39.9) 12/21/2017  . Depression 08/18/2015  . Positive fecal occult blood test 06/23/2015  . Pelvic pressure in female 01/23/2014  . Rectocele 01/23/2014     Problem List Items Addressed This Visit      Musculoskeletal and Integument   Infected prosthesis of left hip (HCC) - Primary    Ms. Derk has completed 6 weeks of ceftriaxone via PICC following antibiotic spacer placement in two-step exchange.  PICC line is removed with no complications by home health.  Inflammatory markers within normal ranges.  Discussed plan of care to include follow-up with  orthopedic surgery on January 18 with antibiotic holiday until that time when they will determine the appropriateness/timing for new hip placement.  Follow-up with ID as needed pending two-step exchange.         I am having Zavia L. Desa maintain her furosemide, levothyroxine, zolpidem, losartan-hydrochlorothiazide, Calcium-Magnesium-Zinc (CAL-MAG-ZINC PO), levocetirizine, fluticasone, polyethylene glycol powder, buPROPion, potassium chloride SA, Fish Oil, vitamin B-12, citalopram, enoxaparin, baclofen, HYDROcodone-acetaminophen, cephALEXin, fluconazole, traMADol, and oxyCODONE.   Follow-up: Return if symptoms worsen or fail to improve.   Marcos Eke, MSN, FNP-C Nurse Practitioner [facility] Main number: [phone]

## 2020-09-01 NOTE — Patient Instructions (Signed)
Nice to see you.   No further antibiotics are needed at this time.  We will plan follow up as needed depending on your visit with your surgeon and surgery.  Have a great day and stay safe!  Happy New Year!

## 2020-09-07 DIAGNOSIS — I10 Essential (primary) hypertension: Secondary | ICD-10-CM | POA: Diagnosis not present

## 2020-09-07 DIAGNOSIS — F17210 Nicotine dependence, cigarettes, uncomplicated: Secondary | ICD-10-CM | POA: Diagnosis not present

## 2020-09-07 DIAGNOSIS — G4733 Obstructive sleep apnea (adult) (pediatric): Secondary | ICD-10-CM | POA: Diagnosis not present

## 2020-09-07 DIAGNOSIS — Z452 Encounter for adjustment and management of vascular access device: Secondary | ICD-10-CM | POA: Diagnosis not present

## 2020-09-07 DIAGNOSIS — T8452XA Infection and inflammatory reaction due to internal left hip prosthesis, initial encounter: Secondary | ICD-10-CM | POA: Diagnosis not present

## 2020-09-07 DIAGNOSIS — J449 Chronic obstructive pulmonary disease, unspecified: Secondary | ICD-10-CM | POA: Diagnosis not present

## 2020-09-11 ENCOUNTER — Telehealth: Payer: Self-pay

## 2020-09-11 NOTE — Telephone Encounter (Signed)
Received call from [place] with [facility] requesting pull PICC orders for patient. Per most recent office visit on 12/28, patient's PICC was no longer present as IV abx were completed. Awaiting call back to confirm.   Cortnie Ringel Lorita Officer, RN

## 2020-09-15 DIAGNOSIS — Z452 Encounter for adjustment and management of vascular access device: Secondary | ICD-10-CM | POA: Diagnosis not present

## 2020-09-15 DIAGNOSIS — J449 Chronic obstructive pulmonary disease, unspecified: Secondary | ICD-10-CM | POA: Diagnosis not present

## 2020-09-15 DIAGNOSIS — G4733 Obstructive sleep apnea (adult) (pediatric): Secondary | ICD-10-CM | POA: Diagnosis not present

## 2020-09-15 DIAGNOSIS — T8452XA Infection and inflammatory reaction due to internal left hip prosthesis, initial encounter: Secondary | ICD-10-CM | POA: Diagnosis not present

## 2020-09-15 DIAGNOSIS — F17210 Nicotine dependence, cigarettes, uncomplicated: Secondary | ICD-10-CM | POA: Diagnosis not present

## 2020-09-15 DIAGNOSIS — I10 Essential (primary) hypertension: Secondary | ICD-10-CM | POA: Diagnosis not present

## 2020-09-22 DIAGNOSIS — T8149XA Infection following a procedure, other surgical site, initial encounter: Secondary | ICD-10-CM | POA: Diagnosis not present

## 2020-10-01 DIAGNOSIS — Z4732 Aftercare following explantation of hip joint prosthesis: Secondary | ICD-10-CM | POA: Diagnosis not present

## 2020-10-01 DIAGNOSIS — E669 Obesity, unspecified: Secondary | ICD-10-CM | POA: Diagnosis not present

## 2020-10-01 DIAGNOSIS — Z87891 Personal history of nicotine dependence: Secondary | ICD-10-CM | POA: Diagnosis not present

## 2020-10-01 DIAGNOSIS — Z01818 Encounter for other preprocedural examination: Secondary | ICD-10-CM | POA: Diagnosis not present

## 2020-10-01 DIAGNOSIS — G4733 Obstructive sleep apnea (adult) (pediatric): Secondary | ICD-10-CM | POA: Diagnosis not present

## 2020-10-01 DIAGNOSIS — Z9119 Patient's noncompliance with other medical treatment and regimen: Secondary | ICD-10-CM | POA: Diagnosis not present

## 2020-10-01 DIAGNOSIS — Z79891 Long term (current) use of opiate analgesic: Secondary | ICD-10-CM | POA: Diagnosis not present

## 2020-10-01 DIAGNOSIS — I10 Essential (primary) hypertension: Secondary | ICD-10-CM | POA: Diagnosis not present

## 2020-10-01 DIAGNOSIS — E039 Hypothyroidism, unspecified: Secondary | ICD-10-CM | POA: Diagnosis not present

## 2020-10-01 DIAGNOSIS — M349 Systemic sclerosis, unspecified: Secondary | ICD-10-CM | POA: Diagnosis not present

## 2020-10-01 DIAGNOSIS — F32A Depression, unspecified: Secondary | ICD-10-CM | POA: Diagnosis not present

## 2020-10-06 DIAGNOSIS — E039 Hypothyroidism, unspecified: Secondary | ICD-10-CM | POA: Diagnosis not present

## 2020-10-06 DIAGNOSIS — Z23 Encounter for immunization: Secondary | ICD-10-CM | POA: Diagnosis not present

## 2020-10-06 DIAGNOSIS — M349 Systemic sclerosis, unspecified: Secondary | ICD-10-CM | POA: Diagnosis not present

## 2020-10-06 DIAGNOSIS — Z01810 Encounter for preprocedural cardiovascular examination: Secondary | ICD-10-CM | POA: Diagnosis not present

## 2020-10-06 DIAGNOSIS — Z6833 Body mass index (BMI) 33.0-33.9, adult: Secondary | ICD-10-CM | POA: Diagnosis not present

## 2020-10-16 DIAGNOSIS — M349 Systemic sclerosis, unspecified: Secondary | ICD-10-CM | POA: Diagnosis present

## 2020-10-16 DIAGNOSIS — Z4732 Aftercare following explantation of hip joint prosthesis: Secondary | ICD-10-CM | POA: Diagnosis not present

## 2020-10-16 DIAGNOSIS — E039 Hypothyroidism, unspecified: Secondary | ICD-10-CM | POA: Diagnosis present

## 2020-10-16 DIAGNOSIS — I10 Essential (primary) hypertension: Secondary | ICD-10-CM | POA: Diagnosis present

## 2020-10-16 DIAGNOSIS — T8452XA Infection and inflammatory reaction due to internal left hip prosthesis, initial encounter: Secondary | ICD-10-CM | POA: Diagnosis not present

## 2020-10-16 DIAGNOSIS — Z9119 Patient's noncompliance with other medical treatment and regimen: Secondary | ICD-10-CM | POA: Diagnosis not present

## 2020-10-16 DIAGNOSIS — Z96642 Presence of left artificial hip joint: Secondary | ICD-10-CM | POA: Diagnosis not present

## 2020-10-16 DIAGNOSIS — Z79891 Long term (current) use of opiate analgesic: Secondary | ICD-10-CM | POA: Diagnosis not present

## 2020-10-16 DIAGNOSIS — Z471 Aftercare following joint replacement surgery: Secondary | ICD-10-CM | POA: Diagnosis not present

## 2020-10-16 DIAGNOSIS — Z87891 Personal history of nicotine dependence: Secondary | ICD-10-CM | POA: Diagnosis not present

## 2020-10-16 DIAGNOSIS — G4733 Obstructive sleep apnea (adult) (pediatric): Secondary | ICD-10-CM | POA: Diagnosis present

## 2020-10-16 DIAGNOSIS — F32A Depression, unspecified: Secondary | ICD-10-CM | POA: Diagnosis present

## 2020-10-27 DIAGNOSIS — Z96642 Presence of left artificial hip joint: Secondary | ICD-10-CM | POA: Diagnosis not present

## 2020-10-27 DIAGNOSIS — Z471 Aftercare following joint replacement surgery: Secondary | ICD-10-CM | POA: Diagnosis not present

## 2020-11-02 ENCOUNTER — Encounter (HOSPITAL_COMMUNITY): Payer: Self-pay | Admitting: Physical Therapy

## 2020-11-02 NOTE — Therapy (Signed)
[place], [place], [address] Phone: [phone]   Fax:  [phone]  Patient Details  Name: Regina Berry MRN: [id number] Date of Birth: 1950-09-14 Referring Provider:  No ref. provider found  Encounter Date: 11/02/2020  PHYSICAL THERAPY DISCHARGE SUMMARY  Visits from Start of Care: 13  Current functional level related to goals / functional outcomes: Unknown as patient has not returned   Remaining deficits: Unknown as patient has not returned   Education / Equipment: HEP Plan: Patient agrees to discharge.  Patient goals were partially met. Patient is being discharged due to not returning since the last visit.  ?????       10:32 AM, 11/02/20 Mearl Latin PT, DPT Physical Therapist at [facility] [place], [place], [address] Phone: [phone]   Fax:  [phone]

## 2020-12-02 DIAGNOSIS — Z96642 Presence of left artificial hip joint: Secondary | ICD-10-CM | POA: Diagnosis not present

## 2020-12-02 DIAGNOSIS — Z471 Aftercare following joint replacement surgery: Secondary | ICD-10-CM | POA: Diagnosis not present

## 2020-12-22 ENCOUNTER — Other Ambulatory Visit (HOSPITAL_COMMUNITY): Payer: Self-pay | Admitting: Family Medicine

## 2020-12-22 DIAGNOSIS — Z1231 Encounter for screening mammogram for malignant neoplasm of breast: Secondary | ICD-10-CM

## 2020-12-24 ENCOUNTER — Ambulatory Visit (HOSPITAL_COMMUNITY): Payer: Medicare Other

## 2020-12-28 DIAGNOSIS — C4441 Basal cell carcinoma of skin of scalp and neck: Secondary | ICD-10-CM | POA: Diagnosis not present

## 2020-12-28 DIAGNOSIS — C44722 Squamous cell carcinoma of skin of right lower limb, including hip: Secondary | ICD-10-CM | POA: Diagnosis not present

## 2020-12-28 DIAGNOSIS — D0439 Carcinoma in situ of skin of other parts of face: Secondary | ICD-10-CM | POA: Diagnosis not present

## 2020-12-28 DIAGNOSIS — C44712 Basal cell carcinoma of skin of right lower limb, including hip: Secondary | ICD-10-CM | POA: Diagnosis not present

## 2020-12-28 DIAGNOSIS — L578 Other skin changes due to chronic exposure to nonionizing radiation: Secondary | ICD-10-CM | POA: Diagnosis not present

## 2020-12-30 ENCOUNTER — Ambulatory Visit (HOSPITAL_COMMUNITY)
Admission: RE | Admit: 2020-12-30 | Discharge: 2020-12-30 | Disposition: A | Discharge status: Home / Self Care | Payer: Medicare Other | Source: Ambulatory Visit | Attending: Family Medicine | Admitting: Family Medicine

## 2020-12-30 ENCOUNTER — Other Ambulatory Visit: Payer: Self-pay

## 2020-12-30 DIAGNOSIS — Z1231 Encounter for screening mammogram for malignant neoplasm of breast: Secondary | ICD-10-CM | POA: Insufficient documentation

## 2021-01-19 DIAGNOSIS — Z96642 Presence of left artificial hip joint: Secondary | ICD-10-CM | POA: Diagnosis not present

## 2021-01-19 DIAGNOSIS — Z471 Aftercare following joint replacement surgery: Secondary | ICD-10-CM | POA: Diagnosis not present

## 2021-01-25 ENCOUNTER — Other Ambulatory Visit: Payer: Medicare Other | Admitting: Adult Health

## 2021-01-26 ENCOUNTER — Encounter: Payer: Self-pay | Admitting: Adult Health

## 2021-01-26 ENCOUNTER — Ambulatory Visit (INDEPENDENT_AMBULATORY_CARE_PROVIDER_SITE_OTHER): Payer: Medicare Other | Admitting: Adult Health

## 2021-01-26 ENCOUNTER — Other Ambulatory Visit (HOSPITAL_COMMUNITY)
Admission: RE | Admit: 2021-01-26 | Discharge: 2021-01-26 | Disposition: A | Discharge status: Home / Self Care | Payer: Medicare Other | Source: Ambulatory Visit | Attending: Adult Health | Admitting: Adult Health

## 2021-01-26 ENCOUNTER — Other Ambulatory Visit: Payer: Self-pay

## 2021-01-26 VITALS — BP 128/67 | HR 72 | Ht 62.5 in | Wt 201.0 lb

## 2021-01-26 DIAGNOSIS — Z01419 Encounter for gynecological examination (general) (routine) without abnormal findings: Secondary | ICD-10-CM | POA: Insufficient documentation

## 2021-01-26 DIAGNOSIS — Z1272 Encounter for screening for malignant neoplasm of vagina: Secondary | ICD-10-CM | POA: Diagnosis not present

## 2021-01-26 DIAGNOSIS — Z1151 Encounter for screening for human papillomavirus (HPV): Secondary | ICD-10-CM | POA: Diagnosis not present

## 2021-01-26 DIAGNOSIS — M25471 Effusion, right ankle: Secondary | ICD-10-CM | POA: Insufficient documentation

## 2021-01-26 DIAGNOSIS — M25472 Effusion, left ankle: Secondary | ICD-10-CM | POA: Diagnosis not present

## 2021-01-26 DIAGNOSIS — Z1211 Encounter for screening for malignant neoplasm of colon: Secondary | ICD-10-CM

## 2021-01-26 LAB — HEMOCCULT GUIAC POC 1CARD (OFFICE): Fecal Occult Blood, POC: NEGATIVE

## 2021-01-26 NOTE — Progress Notes (Signed)
Patient ID: Regina Berry, female   DOB: 1950/10/12, 70 y.o.   MRN: [id number] History of Present Illness: Taraji is a 70 year old white female,married, PM in for a well woman gyn exam and pap. She feels like something has dropped. Had rectocele repaired in 2019. PCP is Dr Hilma Favors.    Current Medications, Allergies, Past Medical History, Past Surgical History, Family History and Social History were reviewed in [employer] record.     Review of Systems: Patient denies any headaches, hearing loss, fatigue, blurred vision, shortness of breath, chest pain, abdominal pain, problems with bowel movements, urination, or intercourse.(not active) No joint pain or mood swings. Has swelling in feet and ankles.   Physical Exam:BP 128/67 (BP Location: Left Arm, Patient Position: Sitting, Cuff Size: Large)   Pulse 72   Ht 5' 2.5" (1.588 m)   Wt 201 lb (91.2 kg)   BMI 36.18 kg/m  General:  Well developed, well nourished, no acute distress Skin:  Warm and dry Neck:  Midline trachea, normal thyroid, good ROM, no lymphadenopathy,no carotid bruits heard Lungs; Clear to auscultation bilaterally Breast:  No dominant palpable mass, retraction, or nipple discharge Cardiovascular: Regular rate and rhythm Abdomen:  Soft, non tender, no hepatosplenomegaly Pelvic:  External genitalia is normal in appearance, no lesions.  The vagina is pale with loss of moisture and rugae.Marland Kitchen Urethra has no lesions or masses. The cervix is smooth, and stenotic os, pap with HR HPV performed.  Uterus is felt to be normal size, shape, and contour.Has good support.  No adnexal masses or tenderness noted.Bladder is non tender, no masses felt. Rectal: Good sphincter tone, no polyps, or hemorrhoids felt.  Hemoccult negative.No recurrence of rectocele. Extremities/musculoskeletal:  no clubbing or cyanosis, has swelling both feet and ankles and has seen dermatologist and had several areas removed and cryo done. Psych:   No mood changes, alert and cooperative,seems happy AA is 1 Fall risk is low PHQ 9 score is 5  GAD 7 score is 1  [facility] - 01/26/21 1410      Pregnancy Intention Screening   Does the patient want to become pregnant in the next year? N/A    Does the patient's partner want to become pregnant in the next year? N/A    Would the patient like to discuss contraceptive options today? N/A      Contraception Wrap Up   Current Method No Method - Other Reason   postmenopausal   End Method No Method - Other Reason   postmenopausal   Contraception Counseling Provided No         Examination chaperoned by Levy Pupa LPN  Impression and Plan:  1. Encounter for gynecological examination with Papanicolaou smear of cervix Pap sent, this is her last pap, as long as normal  Physical with PCP Labs with PCP or cardiology Colonoscopy per GI Mammogram yearly  Follow up her prn   2. Encounter for screening fecal occult blood testing   3. Swelling of both ankles Call cardiologist

## 2021-01-27 ENCOUNTER — Encounter: Payer: Self-pay | Admitting: Cardiology

## 2021-01-27 ENCOUNTER — Ambulatory Visit (INDEPENDENT_AMBULATORY_CARE_PROVIDER_SITE_OTHER): Payer: Medicare Other | Admitting: Cardiology

## 2021-01-27 VITALS — BP 110/66 | HR 82 | Ht 63.5 in | Wt 201.0 lb

## 2021-01-27 DIAGNOSIS — E669 Obesity, unspecified: Secondary | ICD-10-CM | POA: Diagnosis not present

## 2021-01-27 DIAGNOSIS — I10 Essential (primary) hypertension: Secondary | ICD-10-CM

## 2021-01-27 DIAGNOSIS — R600 Localized edema: Secondary | ICD-10-CM

## 2021-01-27 DIAGNOSIS — Z9989 Dependence on other enabling machines and devices: Secondary | ICD-10-CM | POA: Diagnosis not present

## 2021-01-27 DIAGNOSIS — G4733 Obstructive sleep apnea (adult) (pediatric): Secondary | ICD-10-CM

## 2021-01-27 NOTE — Patient Instructions (Signed)

## 2021-01-27 NOTE — Progress Notes (Addendum)
Date:  01/27/2021   ID:  Regina Berry, DOB [birth year], MRN [id number]  PCP:  Sharilyn Sites, MD  Sleep Medicine:  Fransico Him, MD Electrophysiologist:  None   Chief Complaint:  OSA  History of Present Illness:    Regina Berry is a 70 y.o. female with a hx of limited scleroderma, tobacco use, hypertension, anxiety and obesity.  2D echo showed no evidence of pulmonary hypertension or RV strain and CT showed no pulmonary fibrosis.  She was complaining of dyspnea on exertion when she saw Dr. Haroldine Laws and given her obesity a sleep study was ordered.  This showed severe obstructive sleep apnea with an AHI of 29.4 with oxygen saturations as low as 75%.  She underwent CPAP titration to 6 cm water pressure and 2 L of oxygen and was also added for nocturnal hypoxemia  SHe is doing well with her CPAP device and thinks that she has gotten used to it.  She tolerates the mask and feels the pressure is adequate.  Since going on CPAP she feels rested in the am and has no significant daytime sleepiness if she slept well the night before.  She denies any significant mouth or nasal dryness or nasal congestion.  She does not think that he snores.     Prior CV studies:   The following studies were reviewed today:  PAP compliance download  Past Medical History:  Diagnosis Date  . Anemia   . Anxiety   . Arthritis    Lt hip  . Cancer ([place]) 2020   skin  Rt leg   . Chronic bronchitis ([place])   . COPD (chronic obstructive pulmonary disease) ([place])   . Dyspnea   . GERD (gastroesophageal reflux disease)   . Hypertension   . Hypothyroidism   . Obesity (BMI 30-39.9) 12/21/2017  . OSA on CPAP 12/21/2017   Severe with AHI 29.4/h and oxygen desaturations as low as 75%.  She is now on CPAP at 6 cm water pressure with 2 L oxygen due to nocturnal hypoxemia  . Pelvic pressure in female 01/23/2014  . Positive fecal occult blood test 06/23/2015  . Rectocele 01/23/2014  . Scleroderma, limited ([place])   . Thyroid  disease    Past Surgical History:  Procedure Laterality Date  . APPLICATION OF WOUND VAC Left 05/15/2020   Procedure: APPLICATION OF WOUND VAC;  Surgeon: Marchia Bond, MD;  [facility];  Service: Orthopedics;  Laterality: Left;  . CESAREAN SECTION    . COLONOSCOPY N/A 09/11/2015   Procedure: COLONOSCOPY;  Surgeon: Rogene Houston, MD;  [facility];  Service: Endoscopy;  Laterality: N/A;  moved to 1/6 @ 1:35 - Ann to notify pt  . EYE SURGERY Bilateral 2021  . INCISION AND DRAINAGE HIP Left 05/15/2020   Procedure: IRRIGATION AND DEBRIDEMENT HIP LEFT HIP, POSSIBLE POLLY EXCHANGE;  Surgeon: Marchia Bond, MD;  [facility];  Service: Orthopedics;  Laterality: Left;  . LUNG SURGERY Left    infection on outside of lung that had to be"scaped off".  [employer] REPAIR N/A 01/02/2018   Procedure: POSTERIOR REPAIR (RECTOCELE);  Surgeon: Jonnie Kind, MD;  Location: [facility];  Service: Gynecology;  Laterality: N/A;  . TOTAL HIP ARTHROPLASTY Left 03/17/2020   Procedure: TOTAL HIP ARTHROPLASTY;  Surgeon: Marchia Bond, MD;  [facility];  Service: Orthopedics;  Laterality: Left;  . TOTAL HIP ARTHROPLASTY Left 05/13/2020   Procedure: TOTAL HIP ARTHROPLASTY IRRIGATION AND DEBRIDEMENT, POLY EXCHANGE;  Surgeon: Mardelle Matte,  Vonna Kotyk, MD;  [facility];  Service: Orthopedics;  Laterality: Left;     Current Meds  Medication Sig  . ALPRAZolam (XANAX) 0.5 MG tablet Take 0.5 mg by mouth daily as needed for anxiety.  . Ascorbic Acid (VITAMIN C PO) Take by mouth.  Marland Kitchen aspirin EC 81 MG tablet Take 81 mg by mouth daily. Swallow whole.  Marland Kitchen buPROPion (WELLBUTRIN XL) 300 MG 24 hr tablet Take 300 mg by mouth daily.  . Calcium-Magnesium-Zinc (CAL-MAG-ZINC PO) Take 1 tablet by mouth daily.  . Cholecalciferol (VITAMIN D3 PO) Take 1 capsule by mouth daily.  . citalopram (CELEXA) 40 MG tablet Take 40 mg by mouth at bedtime.  Marland Kitchen doxycycline (MONODOX) 100 MG capsule Take 100 mg by mouth 2 (two) times  daily.  . fluticasone (FLONASE) 50 MCG/ACT nasal spray Place 2 sprays into both nostrils daily.  . furosemide (LASIX) 20 MG tablet Take 20 mg by mouth daily as needed (fluid retention.).   Marland Kitchen HYDROcodone-acetaminophen (NORCO) 5-325 MG tablet Take 1 tablet by mouth every 4 (four) hours as needed for moderate pain. MAXIMUM TOTAL ACETAMINOPHEN DOSE IS 4000 MG PER DAY  . levothyroxine (SYNTHROID) 88 MCG tablet Take 88 mcg by mouth daily before breakfast.   . losartan-hydrochlorothiazide (HYZAAR) 100-25 MG tablet Take 1 tablet by mouth daily.   . Omega-3 Fatty Acids (FISH OIL) 1000 MG CAPS Take 1,000 mg by mouth daily.  . potassium chloride SA (K-DUR) 20 MEQ tablet Take 20 mEq by mouth daily.   . vitamin E 180 MG (400 UNITS) capsule Take 400 Units by mouth daily.  Marland Kitchen zolpidem (AMBIEN) 10 MG tablet Take 10 mg by mouth at bedtime as needed for sleep.      Allergies:   Codeine   Social History   Tobacco Use  . Smoking status: Former Smoker    Packs/day: 1.00    Years: 40.00    Pack years: 40.00    Types: Cigarettes    Quit date: 07/2020    Years since quitting: 0.5  . Smokeless tobacco: Never Used  Vaping Use  . Vaping Use: Former  Substance Use Topics  . Alcohol use: Yes    Alcohol/week: 0.0 standard drinks    Comment: occasionally  . Drug use: No     Family Hx: The patient's family history includes COPD in her mother; Cancer in her father and mother; Diabetes in her father and mother; Heart disease in her father; Other in her daughter.  ROS:   Please see the history of present illness.     All other systems reviewed and are negative.   Labs/Other Tests and Data Reviewed:    Recent Labs: 08/30/2020: ALT 12; BUN 30; Creatinine, Ser 1.14; Hemoglobin 8.5; Platelets 233; Potassium 4.0; Sodium 138   Recent Lipid Panel No results found for: CHOL, TRIG, HDL, CHOLHDL, LDLCALC, LDLDIRECT  Wt Readings from Last 3 Encounters:  01/27/21 201 lb (91.2 kg)  01/26/21 201 lb (91.2 kg)   09/01/20 200 lb (90.7 kg)     Objective:    Vital Signs:  BP 110/66   Pulse 82   Ht 5' 3.5" (1.613 m)   Wt 201 lb (91.2 kg)   SpO2 96%   BMI 35.05 kg/m    GEN: Well nourished, well developed in no acute distress HEENT: Normal NECK: No JVD; No carotid bruits LYMPHATICS: No lymphadenopathy CARDIAC:RRR, no murmurs, rubs, gallops RESPIRATORY:  Clear to auscultation without rales, wheezing or rhonchi  ABDOMEN: Soft, non-tender, non-distended MUSCULOSKELETAL:  trace edema; No deformity  SKIN: Warm and dry NEUROLOGIC:  Alert and oriented x 3 PSYCHIATRIC:  Normal affect    ASSESSMENT & PLAN:    1.  OSA - The patient is tolerating PAP therapy well without any problems. The PAP download performed by his DME was personally reviewed and interpreted by me today and showed an AHI of 0.7/hr on 6 cm H2O with 70% compliance in using more than 4 hours nightly.  The patient has been using and benefiting from PAP use and will continue to benefit from therapy.  -Continue prescription drug management with PAP supplies for 1 year ordered and sent to DME to issue to patient -she would like to try a nasal cushion mask instead of the pillows so I have placed an order with her DME -I will order an overnight pulse ox on CPAP and her O2 at 2L - she has not had 1 done in some time  2.  Obesity  -I have encouraged her to get into a routine exercise program and cut back on carbs and portions.   3.  Hypertension -BP controlled on exam today -I have personally reviewed and interpreted outside labs performed by patient's PCP which showed SCr 1.14, TSH 5.85  and K+ 4 in Dec 2021 -Continue prescription drug management with Losartan HCT 100-25mg  1/2 tab daily  4.  LE edema -she complains of swelling in her legs -I think this is multifactorial from obesity, sedentary state and dietary indiscretion with Na -I recommended following a < 1.5gm Na diet -she has minimal edema on exam today -continue  diuretics  Medication Adjustments/Labs and Tests Ordered: Current medicines are reviewed at length with the patient today.  Concerns regarding medicines are outlined above.  Tests Ordered: No orders of the defined types were placed in this encounter.  Medication Changes: No orders of the defined types were placed in this encounter.   Disposition:  Follow up in 1 year(s)  Signed, Fransico Him, MD  01/27/2021 3:46 PM    [place]

## 2021-01-28 ENCOUNTER — Telehealth: Payer: Self-pay | Admitting: *Deleted

## 2021-01-28 DIAGNOSIS — G4733 Obstructive sleep apnea (adult) (pediatric): Secondary | ICD-10-CM

## 2021-01-28 DIAGNOSIS — Z9989 Dependence on other enabling machines and devices: Secondary | ICD-10-CM

## 2021-01-28 LAB — CYTOLOGY - PAP
Comment: NEGATIVE
Diagnosis: NEGATIVE
High risk HPV: NEGATIVE

## 2021-01-28 NOTE — Telephone Encounter (Signed)
-----   Message from Antonieta Iba, RN sent at 01/27/2021  3:48 PM EDT ----- Please order patient a nasal cushion mask.  Thanks!

## 2021-01-28 NOTE — Telephone Encounter (Signed)
Order placed to [facility] for nasal cushion mask.

## 2021-02-08 DIAGNOSIS — B078 Other viral warts: Secondary | ICD-10-CM | POA: Diagnosis not present

## 2021-02-08 DIAGNOSIS — Z08 Encounter for follow-up examination after completed treatment for malignant neoplasm: Secondary | ICD-10-CM | POA: Diagnosis not present

## 2021-02-08 DIAGNOSIS — Z85828 Personal history of other malignant neoplasm of skin: Secondary | ICD-10-CM | POA: Diagnosis not present

## 2021-02-08 DIAGNOSIS — L928 Other granulomatous disorders of the skin and subcutaneous tissue: Secondary | ICD-10-CM | POA: Diagnosis not present

## 2021-02-23 DIAGNOSIS — E6609 Other obesity due to excess calories: Secondary | ICD-10-CM | POA: Diagnosis not present

## 2021-02-23 DIAGNOSIS — I7300 Raynaud's syndrome without gangrene: Secondary | ICD-10-CM | POA: Diagnosis not present

## 2021-02-23 DIAGNOSIS — G894 Chronic pain syndrome: Secondary | ICD-10-CM | POA: Diagnosis not present

## 2021-02-23 DIAGNOSIS — I10 Essential (primary) hypertension: Secondary | ICD-10-CM | POA: Diagnosis not present

## 2021-02-23 DIAGNOSIS — F419 Anxiety disorder, unspecified: Secondary | ICD-10-CM | POA: Diagnosis not present

## 2021-02-23 DIAGNOSIS — M349 Systemic sclerosis, unspecified: Secondary | ICD-10-CM | POA: Diagnosis not present

## 2021-02-23 DIAGNOSIS — R7309 Other abnormal glucose: Secondary | ICD-10-CM | POA: Diagnosis not present

## 2021-02-23 DIAGNOSIS — E782 Mixed hyperlipidemia: Secondary | ICD-10-CM | POA: Diagnosis not present

## 2021-02-23 DIAGNOSIS — Z6834 Body mass index (BMI) 34.0-34.9, adult: Secondary | ICD-10-CM | POA: Diagnosis not present

## 2021-02-23 DIAGNOSIS — E039 Hypothyroidism, unspecified: Secondary | ICD-10-CM | POA: Diagnosis not present

## 2021-05-06 DIAGNOSIS — M349 Systemic sclerosis, unspecified: Secondary | ICD-10-CM | POA: Diagnosis not present

## 2021-05-06 DIAGNOSIS — R5383 Other fatigue: Secondary | ICD-10-CM | POA: Diagnosis not present

## 2021-05-06 DIAGNOSIS — I7300 Raynaud's syndrome without gangrene: Secondary | ICD-10-CM | POA: Diagnosis not present

## 2021-05-06 DIAGNOSIS — Z6836 Body mass index (BMI) 36.0-36.9, adult: Secondary | ICD-10-CM | POA: Diagnosis not present

## 2021-05-06 DIAGNOSIS — R600 Localized edema: Secondary | ICD-10-CM | POA: Diagnosis not present

## 2021-05-06 DIAGNOSIS — E669 Obesity, unspecified: Secondary | ICD-10-CM | POA: Diagnosis not present

## 2021-05-06 DIAGNOSIS — R0602 Shortness of breath: Secondary | ICD-10-CM | POA: Diagnosis not present

## 2021-05-17 ENCOUNTER — Other Ambulatory Visit (HOSPITAL_COMMUNITY): Payer: Self-pay | Admitting: *Deleted

## 2021-05-17 DIAGNOSIS — R0600 Dyspnea, unspecified: Secondary | ICD-10-CM

## 2021-05-17 DIAGNOSIS — M349 Systemic sclerosis, unspecified: Secondary | ICD-10-CM

## 2021-05-17 NOTE — Progress Notes (Signed)
Pt past due for echo and f/u with Dr Haroldine Laws, order placed, will arrange

## 2021-05-19 DIAGNOSIS — H04123 Dry eye syndrome of bilateral lacrimal glands: Secondary | ICD-10-CM | POA: Diagnosis not present

## 2021-05-20 DIAGNOSIS — J209 Acute bronchitis, unspecified: Secondary | ICD-10-CM | POA: Diagnosis not present

## 2021-05-20 DIAGNOSIS — Z681 Body mass index (BMI) 19 or less, adult: Secondary | ICD-10-CM | POA: Diagnosis not present

## 2021-05-20 DIAGNOSIS — R600 Localized edema: Secondary | ICD-10-CM | POA: Diagnosis not present

## 2021-05-20 DIAGNOSIS — J069 Acute upper respiratory infection, unspecified: Secondary | ICD-10-CM | POA: Diagnosis not present

## 2021-05-20 DIAGNOSIS — B370 Candidal stomatitis: Secondary | ICD-10-CM | POA: Diagnosis not present

## 2021-06-03 ENCOUNTER — Encounter (HOSPITAL_COMMUNITY): Payer: Self-pay

## 2021-06-03 ENCOUNTER — Emergency Department (HOSPITAL_COMMUNITY): Payer: Medicare Other

## 2021-06-03 ENCOUNTER — Emergency Department (HOSPITAL_COMMUNITY)
Admission: EM | Admit: 2021-06-03 | Discharge: 2021-06-03 | Disposition: A | Discharge status: Home / Self Care | Payer: Medicare Other | Attending: Student | Admitting: Student

## 2021-06-03 ENCOUNTER — Other Ambulatory Visit: Payer: Self-pay

## 2021-06-03 DIAGNOSIS — Z96642 Presence of left artificial hip joint: Secondary | ICD-10-CM | POA: Insufficient documentation

## 2021-06-03 DIAGNOSIS — S52134A Nondisplaced fracture of neck of right radius, initial encounter for closed fracture: Secondary | ICD-10-CM | POA: Diagnosis not present

## 2021-06-03 DIAGNOSIS — I10 Essential (primary) hypertension: Secondary | ICD-10-CM | POA: Insufficient documentation

## 2021-06-03 DIAGNOSIS — W1830XA Fall on same level, unspecified, initial encounter: Secondary | ICD-10-CM | POA: Diagnosis not present

## 2021-06-03 DIAGNOSIS — E039 Hypothyroidism, unspecified: Secondary | ICD-10-CM | POA: Diagnosis not present

## 2021-06-03 DIAGNOSIS — Z87891 Personal history of nicotine dependence: Secondary | ICD-10-CM | POA: Insufficient documentation

## 2021-06-03 DIAGNOSIS — J449 Chronic obstructive pulmonary disease, unspecified: Secondary | ICD-10-CM | POA: Insufficient documentation

## 2021-06-03 DIAGNOSIS — Z043 Encounter for examination and observation following other accident: Secondary | ICD-10-CM | POA: Diagnosis not present

## 2021-06-03 DIAGNOSIS — Y92481 Parking lot as the place of occurrence of the external cause: Secondary | ICD-10-CM | POA: Insufficient documentation

## 2021-06-03 DIAGNOSIS — Z79899 Other long term (current) drug therapy: Secondary | ICD-10-CM | POA: Insufficient documentation

## 2021-06-03 DIAGNOSIS — Z85828 Personal history of other malignant neoplasm of skin: Secondary | ICD-10-CM | POA: Insufficient documentation

## 2021-06-03 DIAGNOSIS — S59901A Unspecified injury of right elbow, initial encounter: Secondary | ICD-10-CM | POA: Diagnosis present

## 2021-06-03 DIAGNOSIS — Z7982 Long term (current) use of aspirin: Secondary | ICD-10-CM | POA: Diagnosis not present

## 2021-06-03 DIAGNOSIS — M25421 Effusion, right elbow: Secondary | ICD-10-CM | POA: Diagnosis not present

## 2021-06-03 NOTE — ED Provider Notes (Signed)
[facility] EMERGENCY DEPARTMENT Provider Note   CSN: [id number] Arrival date & time: 06/03/21  2019     History Chief Complaint  Patient presents with   Arm Injury    Regina Berry is a 70 y.o. female who presents the emergency department for evaluation of right elbow pain after a fall.  Patient states that she fell in the parking lot and landed on her right elbow.  Denies numbness, tingling, weakness of the extremity.  Denies additional traumatic complaints.   Arm Injury Associated symptoms: no back pain and no fever       Past Medical History:  Diagnosis Date   Anemia    Anxiety    Arthritis    Lt hip   Cancer ([place]) 2020   skin  Rt leg    Chronic bronchitis (HCC)    COPD (chronic obstructive pulmonary disease) (HCC)    Dyspnea    GERD (gastroesophageal reflux disease)    Hypertension    Hypothyroidism    Obesity (BMI 30-39.9) 12/21/2017   OSA on CPAP 12/21/2017   Severe with AHI 29.4/h and oxygen desaturations as low as 75%.  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   Status post total hip replacement, 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   Osteoarthritis of left hip 03/17/2020   Enterocele 01/02/2018   OSA on CPAP 12/21/2017   Obesity (BMI 30-39.9) 12/21/2017   Depression 08/18/2015   Positive fecal occult blood test 06/23/2015   Pelvic pressure in female 01/23/2014   Rectocele 01/23/2014    Past Surgical History:  Procedure Laterality  Date   APPLICATION OF WOUND VAC Left 05/15/2020   Procedure: APPLICATION OF WOUND VAC;  Surgeon: Marchia Bond, MD;  Location: [facility] ORS;  Service: Orthopedics;  Laterality: Left;   CESAREAN SECTION     COLONOSCOPY N/A 09/11/2015   Procedure: COLONOSCOPY;  Surgeon: Rogene Houston, MD;  Location: [facility] ENDO SUITE;  Service: Endoscopy;  Laterality: N/A;  moved to 1/6 @ 1:35 - Ann to notify pt   EYE SURGERY Bilateral 2021   INCISION AND DRAINAGE HIP Left 05/15/2020   Procedure: IRRIGATION AND DEBRIDEMENT HIP LEFT HIP, POSSIBLE POLLY EXCHANGE;  Surgeon: Marchia Bond, MD;  Location: [facility];  Service: Orthopedics;  Laterality: Left;   LUNG SURGERY Left    infection on outside of lung that had to be"scaped off".   RECTOCELE REPAIR N/A 01/02/2018   Procedure: POSTERIOR REPAIR (RECTOCELE);  Surgeon: Jonnie Kind, MD;  Location: [facility] ORS;  Service: Gynecology;  Laterality: N/A;   TOTAL HIP ARTHROPLASTY Left 03/17/2020   Procedure: TOTAL HIP ARTHROPLASTY;  Surgeon: Marchia Bond, MD;  Location: [facility];  Service: Orthopedics;  Laterality: Left;   TOTAL HIP ARTHROPLASTY Left 05/13/2020   Procedure: TOTAL HIP ARTHROPLASTY IRRIGATION AND DEBRIDEMENT, POLY EXCHANGE;  Surgeon: Marchia Bond, MD;  Location: [facility];  Service: Orthopedics;  Laterality: Left;     OB History  Gravida  2   Para  2   Term      Preterm      AB      Living  2      SAB      IAB      Ectopic      Multiple      Live Births  2           Family History  Problem Relation Age of Onset   Diabetes Mother    Cancer Mother        ovarian   COPD Mother    Diabetes Father    Cancer Father        lymphoma   Heart disease Father    Other Daughter        transverse mylitis    Social History   Tobacco Use   Smoking status: Former    Packs/day: 1.00    Years: 40.00    Pack years: 40.00    Types: Cigarettes    Quit date: 07/2020    Years since quitting: 0.9   Smokeless tobacco: Never  Vaping Use   Vaping  Use: Former  Substance Use Topics   Alcohol use: Yes    Alcohol/week: 0.0 standard drinks    Comment: occasionally   Drug use: No    Home Medications Prior to Admission medications   Medication Sig Start Date End Date Taking? Authorizing Provider  ALPRAZolam Duanne Moron) 0.5 MG tablet Take 0.5 mg by mouth daily as needed for anxiety.    [provider]  Ascorbic Acid (VITAMIN C PO) Take by mouth.    [provider]  aspirin EC 81 MG tablet Take 81 mg by mouth daily. Swallow whole.    [provider]  buPROPion (WELLBUTRIN XL) 300 MG 24 hr tablet Take 300 mg by mouth daily. 03/13/19   [provider]  Calcium-Magnesium-Zinc (CAL-MAG-ZINC PO) Take 1 tablet by mouth daily.    [provider]  Cholecalciferol (VITAMIN D3 PO) Take 1 capsule by mouth daily.    [provider]  citalopram (CELEXA) 40 MG tablet Take 40 mg by mouth at bedtime.    [provider]  doxycycline (MONODOX) 100 MG capsule Take 100 mg by mouth 2 (two) times daily. 01/18/21   [provider]  fluticasone (FLONASE) 50 MCG/ACT nasal spray Place 2 sprays into both nostrils daily.    [provider]  furosemide (LASIX) 20 MG tablet Take 20 mg by mouth daily as needed (fluid retention.).     [provider]  HYDROcodone-acetaminophen (NORCO) 5-325 MG tablet Take 1 tablet by mouth every 4 (four) hours as needed for moderate pain. MAXIMUM TOTAL ACETAMINOPHEN DOSE IS 4000 MG PER DAY 05/15/20   Merlene Pulling K, PA-C  levothyroxine (SYNTHROID) 88 MCG tablet Take 88 mcg by mouth daily before breakfast.     [provider]  losartan-hydrochlorothiazide (HYZAAR) 100-25 MG tablet Take 1 tablet by mouth daily.     [provider]  Omega-3 Fatty Acids (FISH OIL) 1000 MG CAPS Take 1,000 mg by mouth daily.    [provider]  potassium chloride SA (K-DUR) 20 MEQ tablet Take 20 mEq by mouth daily.     [provider]  vitamin E  180 MG (400 UNITS) capsule Take 400 Units by mouth daily.    [provider]  zolpidem (AMBIEN) 10 MG tablet Take 10 mg by mouth at bedtime as needed for sleep.  [provider]    Allergies    Codeine  Review of Systems   Review of Systems  Constitutional:  Negative for chills and fever.  HENT:  Negative for ear pain and sore throat.   Eyes:  Negative for pain and visual disturbance.  Respiratory:  Negative for cough and shortness of breath.   Cardiovascular:  Negative for chest pain and palpitations.  Gastrointestinal:  Negative for abdominal pain and vomiting.  Genitourinary:  Negative for dysuria and hematuria.  Musculoskeletal:  Positive for arthralgias. Negative for back pain.  Skin:  Negative for color change and rash.  Neurological:  Negative for seizures and syncope.  All other systems reviewed and are negative.  Physical Exam Updated Vital Signs BP (!) 112/51 (BP Location: Left Arm)   Pulse 75   Temp 98.1 F (36.7 C) (Oral)   Resp 16   Ht 5\' 3"  (1.6 m)   Wt 90.7 kg   SpO2 96%   BMI 35.43 kg/m   Physical Exam Vitals and nursing note reviewed.  Constitutional:      General: She is not in acute distress.    Appearance: She is well-developed.  HENT:     Head: Normocephalic and atraumatic.  Eyes:     Conjunctiva/sclera: Conjunctivae normal.  Cardiovascular:     Rate and Rhythm: Normal rate and regular rhythm.     Heart sounds: No murmur heard. Pulmonary:     Effort: Pulmonary effort is normal. No respiratory distress.     Breath sounds: Normal breath sounds.  Abdominal:     Palpations: Abdomen is soft.     Tenderness: There is no abdominal tenderness.  Musculoskeletal:        General: Swelling and tenderness (Right elbow) present.     Cervical back: Neck supple.  Skin:    General: Skin is warm and dry.  Neurological:     Mental Status: She is alert.    ED Results / Procedures / Treatments   Labs (all labs ordered are listed, but  only abnormal results are displayed) Labs Reviewed - No data to display  EKG None  Radiology DG Elbow Complete Right  Result Date: 06/03/2021 CLINICAL DATA:  [place] at home EXAM: RIGHT ELBOW - COMPLETE 3+ VIEW COMPARISON:  None. FINDINGS: No malalignment. Positive for elbow effusion. Possible subtle nondisplaced fracture at the radial neck. IMPRESSION: Possible subtle nondisplaced fracture radial neck. Positive for elbow effusion Electronically Signed   By: Donavan Foil M.D.   On: 06/03/2021 21:14    Procedures Procedures   Medications Ordered in ED Medications - No data to display  ED Course  I have reviewed the triage vital signs and the nursing notes.  Pertinent labs & imaging results that were available during my care of the patient were reviewed by me and considered in my medical decision making (see chart for details).    MDM Rules/Calculators/A&P                           Patient seen emergency department for evaluation of right elbow pain after fall.  Physical exam reveals swelling and tenderness over the right elbow but is otherwise unremarkable.  X-ray is a possible subtle nondisplaced right radial neck fracture.  Patient placed in a sling and given the information for outpatient orthopedics.  She was instructed to follow-up in 1 week.  Instructed to take ibuprofen for her pain.  Patient then discharged. Final Clinical Impression(s) / ED  Diagnoses Final diagnoses:  Closed nondisplaced fracture of neck of right radius, initial encounter    Rx / DC Orders ED Discharge Orders     None        Anayiah Howden, Debe Coder, MD 06/03/21 2308

## 2021-06-03 NOTE — ED Triage Notes (Signed)
Pt from home with c/o fall- injury to right arm- c/o pain around elbow.

## 2021-06-04 DIAGNOSIS — M79672 Pain in left foot: Secondary | ICD-10-CM | POA: Diagnosis not present

## 2021-06-04 DIAGNOSIS — M21622 Bunionette of left foot: Secondary | ICD-10-CM | POA: Diagnosis not present

## 2021-06-04 DIAGNOSIS — M7752 Other enthesopathy of left foot: Secondary | ICD-10-CM | POA: Diagnosis not present

## 2021-06-21 DIAGNOSIS — M25572 Pain in left ankle and joints of left foot: Secondary | ICD-10-CM | POA: Diagnosis not present

## 2021-06-22 ENCOUNTER — Ambulatory Visit (HOSPITAL_COMMUNITY)
Admission: RE | Admit: 2021-06-22 | Discharge: 2021-06-22 | Disposition: A | Discharge status: Home / Self Care | Payer: Medicare Other | Source: Ambulatory Visit | Attending: Internal Medicine | Admitting: Internal Medicine

## 2021-06-22 ENCOUNTER — Other Ambulatory Visit: Payer: Self-pay

## 2021-06-22 ENCOUNTER — Ambulatory Visit (HOSPITAL_BASED_OUTPATIENT_CLINIC_OR_DEPARTMENT_OTHER)
Admission: RE | Admit: 2021-06-22 | Discharge: 2021-06-22 | Disposition: A | Discharge status: Home / Self Care | Payer: Medicare Other | Source: Ambulatory Visit | Attending: Internal Medicine | Admitting: Internal Medicine

## 2021-06-22 VITALS — BP 156/98 | HR 83 | Wt 210.8 lb

## 2021-06-22 DIAGNOSIS — E669 Obesity, unspecified: Secondary | ICD-10-CM

## 2021-06-22 DIAGNOSIS — Z7982 Long term (current) use of aspirin: Secondary | ICD-10-CM | POA: Insufficient documentation

## 2021-06-22 DIAGNOSIS — F419 Anxiety disorder, unspecified: Secondary | ICD-10-CM | POA: Diagnosis not present

## 2021-06-22 DIAGNOSIS — Z87891 Personal history of nicotine dependence: Secondary | ICD-10-CM | POA: Insufficient documentation

## 2021-06-22 DIAGNOSIS — Z9989 Dependence on other enabling machines and devices: Secondary | ICD-10-CM | POA: Diagnosis not present

## 2021-06-22 DIAGNOSIS — I272 Pulmonary hypertension, unspecified: Secondary | ICD-10-CM

## 2021-06-22 DIAGNOSIS — Z79899 Other long term (current) drug therapy: Secondary | ICD-10-CM | POA: Insufficient documentation

## 2021-06-22 DIAGNOSIS — M349 Systemic sclerosis, unspecified: Secondary | ICD-10-CM | POA: Insufficient documentation

## 2021-06-22 DIAGNOSIS — J449 Chronic obstructive pulmonary disease, unspecified: Secondary | ICD-10-CM | POA: Insufficient documentation

## 2021-06-22 DIAGNOSIS — R0600 Dyspnea, unspecified: Secondary | ICD-10-CM

## 2021-06-22 DIAGNOSIS — G4733 Obstructive sleep apnea (adult) (pediatric): Secondary | ICD-10-CM | POA: Insufficient documentation

## 2021-06-22 DIAGNOSIS — I10 Essential (primary) hypertension: Secondary | ICD-10-CM | POA: Insufficient documentation

## 2021-06-22 DIAGNOSIS — R0609 Other forms of dyspnea: Secondary | ICD-10-CM | POA: Diagnosis not present

## 2021-06-22 DIAGNOSIS — R931 Abnormal findings on diagnostic imaging of heart and coronary circulation: Secondary | ICD-10-CM

## 2021-06-22 DIAGNOSIS — E039 Hypothyroidism, unspecified: Secondary | ICD-10-CM | POA: Diagnosis not present

## 2021-06-22 DIAGNOSIS — R600 Localized edema: Secondary | ICD-10-CM

## 2021-06-22 DIAGNOSIS — Z885 Allergy status to narcotic agent status: Secondary | ICD-10-CM | POA: Insufficient documentation

## 2021-06-22 DIAGNOSIS — Z8249 Family history of ischemic heart disease and other diseases of the circulatory system: Secondary | ICD-10-CM | POA: Diagnosis not present

## 2021-06-22 LAB — CBC
HCT: 36.7 % (ref 36.0–46.0)
Hemoglobin: 11.6 g/dL — ABNORMAL LOW (ref 12.0–15.0)
MCH: 27.8 pg (ref 26.0–34.0)
MCHC: 31.6 g/dL (ref 30.0–36.0)
MCV: 88 fL (ref 80.0–100.0)
Platelets: 255 10*3/uL (ref 150–400)
RBC: 4.17 MIL/uL (ref 3.87–5.11)
RDW: 14.9 % (ref 11.5–15.5)
WBC: 8.2 10*3/uL (ref 4.0–10.5)
nRBC: 0 % (ref 0.0–0.2)

## 2021-06-22 LAB — BASIC METABOLIC PANEL
Anion gap: 5 (ref 5–15)
BUN: 15 mg/dL (ref 8–23)
CO2: 30 mmol/L (ref 22–32)
Calcium: 9.6 mg/dL (ref 8.9–10.3)
Chloride: 105 mmol/L (ref 98–111)
Creatinine, Ser: 1.03 mg/dL — ABNORMAL HIGH (ref 0.44–1.00)
GFR, Estimated: 59 mL/min — ABNORMAL LOW (ref >60)
Glucose, Bld: 87 mg/dL (ref 70–99)
Potassium: 4.6 mmol/L (ref 3.5–5.1)
Sodium: 140 mmol/L (ref 135–145)

## 2021-06-22 LAB — ECHOCARDIOGRAM COMPLETE
Area-P 1/2: 4.68 cm2
Calc EF: 52.2 %
S' Lateral: 3.8 cm
Single Plane A2C EF: 53.8 %
Single Plane A4C EF: 50.1 %

## 2021-06-22 NOTE — Progress Notes (Signed)
  Echocardiogram 2D Echocardiogram has been performed.  Fidel Levy 06/22/2021, 9:48 AM

## 2021-06-22 NOTE — Progress Notes (Addendum)
ReDS Vest / Clip - 06/22/21 0900       ReDS Vest / Clip   Station Marker B    Ruler Value 36    ReDS Value Range Low volume    ReDS Actual Value 36             REDS value was 32%  Glori Bickers, MD  11:03 AM

## 2021-06-22 NOTE — H&P (View-Only) (Signed)
ReDS Vest / Clip - 06/22/21 0900       ReDS Vest / Clip   Station Marker B    Ruler Value 36    ReDS Value Range Low volume    ReDS Actual Value 36             REDS value was 32%  Glori Bickers, MD  11:03 AM

## 2021-06-22 NOTE — Patient Instructions (Signed)
It was great to see you today! No medication changes are needed at this time.  Labs today We will only contact you if something comes back abnormal or we need to make some changes. Otherwise no news is good news!  Please wear your compression hose daily, place them on as soon as you get up in the morning and remove before you go to bed at night.  Your physician has recommended that you have a pulmonary function test. Pulmonary Function Tests are a group of tests that measure how well air moves in and out of your lungs.  .You have been referred to [facility] Radiology for a Chest CT of Lungs -they will be in contact with an appointment  Your physician recommends that you schedule a follow-up appointment in: 4 months with Dr Haroldine Laws   You are scheduled for a Cardiac Catheterization on Friday, October 28 with Dr. Glori Bickers.  1. Please arrive at the [facility] (Main Entrance A) at [facility]: [address] [place], [place] [address] at 5:30 AM (This time is two hours before your procedure to ensure your preparation). Free valet parking service is available.   Special note: Every effort is made to have your procedure done on time. Please understand that emergencies sometimes delay scheduled procedures.  2. Diet: Do not eat solid foods after midnight.  The patient may have clear liquids until 5am upon the day of the procedure.  3. Labs: Pre procedure labs done 06/22/2021  4. Medication instructions in preparation for your procedure:   Contrast Allergy: No    Stop taking, Lasix (Furosemide)  Friday, October 28,   On the morning of your procedure, take your Aspirin and any morning medicines NOT listed above.  You may use sips of water.  5. Plan for one night stay--bring personal belongings. 6. Bring a current list of your medications and current insurance cards. 7. You MUST have a responsible person to drive you home. 8. Someone MUST be with you the first 24 hours after  you arrive home or your discharge will be delayed. 9. Please wear clothes that are easy to get on and off and wear slip-on shoes.  Thank you for allowing [place] to care for you!   -- [facility]

## 2021-06-22 NOTE — H&P (Signed)
PULMONARY HYPERTENSION CLINIC CONSULT NOTE  Referring Physician: Dr. Trudie Reed PCP: Dr. Hilma Favors  Cardiology: None    HPI:  Regina Berry is 70 y.o. female smoker with scleroderma who is referred back by Dr. Trudie Reed for further evaluation of increasing SOB and LE edema.  She has h/o tobacco use (1 ppd since 70 years old), HTN, hypothroidism, anxiety and obesity. Denies any known cardiac disease. Never had stress test. In 2009 had empyema requiring decortication.   We last saw her in the [facility] in 2018.   In 7/18 had CT scan: Moderate COPD. + patulous esophagus. LAD calcification .  ECHO 11/17: EF  60-65%   RV normal   PFTs 11/18 FEV1 1.14 (47%) FVC 1.44 (46%) DLCO 42%  In 11/21 had trouble with infected left hip replacement. Had IV abx for 6 weeks. Stopped smoking at that time. Over past 6 months has noticed increased LE edema and exertional DOE. No chest pain. Has been taking lasix with only minimal improvement. Wears CPAP and O2 at night  ECHO today 06/22/21 EF 86-76% normal diastolic parameters. Normal RV. No TR to estimate RVSP.   REDS today 32%   Review of Systems: [y] = yes, [ ]  = no   General: Weight gain Blue.Reese ]; Weight loss [ ] ; Anorexia [ ] ; Fatigue [ y]; Fever [ ] ; Chills [ ] ; Weakness [ ]   Cardiac: Chest pain/pressure [ ] ; Resting SOB [ ] ; Exertional SOB Blue.Reese ]; Orthopnea [ ] ; Pedal Edema Blue.Reese ]; Palpitations [ ] ; Syncope [ ] ; Presyncope [ ] ; Paroxysmal nocturnal dyspnea[ ]   Pulmonary: Cough Blue.Reese ]; Wheezing[ ] ; Hemoptysis[ ] ; Sputum [ ] ; Snoring Blue.Reese ]  GI: Vomiting[ ] ; Dysphagia[ ] ; Melena[ ] ; Hematochezia [ ] ; Heartburn[ ] ; Abdominal pain [ ] ; Constipation [ ] ; Diarrhea [ ] ; BRBPR [ ]   GU: Hematuria[ ] ; Dysuria [ ] ; Nocturia[ ]   Vascular: Pain in legs with walking [ ] ; Pain in feet with lying flat [ ] ; Non-healing sores [ ] ; Stroke [ ] ; TIA [ ] ; Slurred speech [ ] ;  Neuro: Headaches[ ] ; Vertigo[ ] ; Seizures[ ] ; Paresthesias[ ] ;Blurred vision [ ] ; Diplopia [ ] ; Vision changes [ ]    Ortho/Skin: Arthritis Blue.Reese ]; Joint pain [ y]; Muscle pain [ ] ; Joint swelling [ ] ; Back Pain Blue.Reese ]; Rash [ ]   Psych: Depression[ ] ; Anxiety[ y]  Heme: Bleeding problems [ ] ; Clotting disorders [ ] ; Anemia [ ]   Endocrine: Diabetes [ ] ; Thyroid dysfunction[ y]   Past Medical History:  Diagnosis Date   Anemia    Anxiety    Arthritis    Lt hip   Cancer ([place]) 2020   skin  Rt leg    Chronic bronchitis (HCC)    COPD (chronic obstructive pulmonary disease) (HCC)    Dyspnea    GERD (gastroesophageal reflux disease)    Hypertension    Hypothyroidism    Obesity (BMI 30-39.9) 12/21/2017   OSA on CPAP 12/21/2017   Severe with AHI 29.4/h and oxygen desaturations as low as 75%.  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HCC)    Thyroid disease     Current Outpatient Medications  Medication Sig Dispense Refill   Ascorbic Acid (VITAMIN C PO) Take by mouth.     aspirin EC 81 MG tablet Take 81 mg  by mouth daily. Swallow whole.     buPROPion (WELLBUTRIN XL) 300 MG 24 hr tablet Take 300 mg by mouth daily.     Calcium-Magnesium-Zinc (CAL-MAG-ZINC PO) Take 1 tablet by mouth daily.     Cholecalciferol (VITAMIN D3 PO) Take 1 capsule by mouth daily.     citalopram (CELEXA) 40 MG tablet Take 40 mg by mouth at bedtime.     ferrous sulfate 325 (65 FE) MG tablet Take 325 mg by mouth daily with breakfast.     furosemide (LASIX) 20 MG tablet Take 20 mg by mouth daily as needed (fluid retention.).      HYDROcodone-acetaminophen (NORCO) 5-325 MG tablet Take 1 tablet by mouth every 4 (four) hours as needed for moderate pain. MAXIMUM TOTAL ACETAMINOPHEN DOSE IS 4000 MG PER DAY 30 tablet 0   levothyroxine (SYNTHROID) 88 MCG tablet Take 88 mcg by mouth daily before breakfast.      losartan-hydrochlorothiazide (HYZAAR) 100-25 MG tablet Take 1  tablet by mouth daily.      Omega-3 Fatty Acids (FISH OIL) 1000 MG CAPS Take 1,000 mg by mouth daily.     potassium chloride SA (K-DUR) 20 MEQ tablet Take 20 mEq by mouth daily.      vitamin E 180 MG (400 UNITS) capsule Take 400 Units by mouth daily.     zolpidem (AMBIEN) 10 MG tablet Take 10 mg by mouth at bedtime as needed for sleep.      No current facility-administered medications for this encounter.    Allergies  Allergen Reactions   Codeine Itching      Social History   Socioeconomic History   Marital status: Married    Spouse name: Not on file   Number of children: Not on file   Years of education: Not on file   Highest education level: Not on file  Occupational History   Not on file  Tobacco Use   Smoking status: Former    Packs/day: 1.00    Years: 40.00    Pack years: 40.00    Types: Cigarettes    Quit date: 07/2020    Years since quitting: 0.9   Smokeless tobacco: Never  Vaping Use   Vaping Use: Former  Substance and Sexual Activity   Alcohol use: Yes    Alcohol/week: 0.0 standard drinks    Comment: occasionally   Drug use: No   Sexual activity: Not Currently    Birth control/protection: Post-menopausal  Other Topics Concern   Not on file  Social History Narrative   Not on file   Social Determinants of Health   Financial Resource Strain: Low Risk    Difficulty of Paying Living Expenses: Not very hard  Food Insecurity: No Food Insecurity   Worried About Running Out of Food in the Last Year: Never true   Ran Out of Food in the Last Year: Never true  Transportation Needs: No Transportation Needs   Lack of Transportation (Medical): No   Lack of Transportation (Non-Medical): No  Physical Activity: Insufficiently Active   Days of Exercise per Week: 1 day   Minutes of Exercise per Session: 20 min  Stress: No Stress Concern Present   Feeling of Stress : Not at all  Social Connections: Moderately Integrated   Frequency of Communication with Friends and  Family: More than three times a week   Frequency of Social Gatherings with Friends and Family: Once a week   Attends Religious Services: More than 4 times per year   Active Member  of Clubs or Organizations: No   Attends Archivist Meetings: Never   Marital Status: Married  Human resources officer Violence: Not At Risk   Fear of Current or Ex-Partner: No   Emotionally Abused: No   Physically Abused: No   Sexually Abused: No      Family History  Problem Relation Age of Onset   Diabetes Mother    Cancer Mother        ovarian   COPD Mother    Diabetes Father    Cancer Father        lymphoma   Heart disease Father    Other Daughter        transverse mylitis    Vitals:   06/22/21 0941  BP: (!) 156/98  Pulse: 83  SpO2: 90%  Weight: 95.6 kg (210 lb 12.8 oz)    PHYSICAL EXAM: General:  Obese woman No respiratory difficulty HEENT: normal Neck: supple. JVP 5-6. Carotids 2+ bilat; no bruits. No lymphadenopathy or thryomegaly appreciated. Cor: PMI nondisplaced. Regular rate & rhythm. Distant HS.  No rubs, gallops or murmurs. Lungs: clear but decreased throughout Abdomen: soft, nontender, nondistended. No hepatosplenomegaly. No bruits or masses. Good bowel sounds. Extremities: no cyanosis, clubbing, rash, edema Neuro: alert & oriented x 3, cranial nerves grossly intact. moves all 4 extremities w/o difficulty. Affect pleasant.  ECG: sinus 66 +PACs anterior Q waves Personally reviewed   ASSESSMENT & PLAN:  1.  Dyspnea  - likely multifactorial due to COPD and obesity and OSA - that said also at risk for diastolic HF, PAH and CAD - Echo today was normal with LV/RV function and normal diastology. With normal REDS doubt HF major issue - Will repeat PFTs and hiRES CT to further evaluate  - Given scleroderma and risk for PAH and coronary calcium + abnormal ECG we discussed possibility of R/L cath versus watchful waiting. She would like to proceed with cath  - if testing ok will  refer to PREP program  2. Scleroderma - Plan as above  3. LE edema.  - with normal echo and REDs suspect mainly venous insufficiency  4. OSA - continue CPAP  5. HTN - f/u with PCP  6. Obesity - would benefit from weight loss with [place].    Glori Bickers, MD  10:28 AM

## 2021-06-22 NOTE — Progress Notes (Deleted)
  The note originally documented on this encounter has been moved the the encounter in which it belongs.  

## 2021-06-24 ENCOUNTER — Other Ambulatory Visit (HOSPITAL_COMMUNITY): Payer: Self-pay | Admitting: *Deleted

## 2021-06-24 DIAGNOSIS — R0600 Dyspnea, unspecified: Secondary | ICD-10-CM

## 2021-06-24 DIAGNOSIS — I272 Pulmonary hypertension, unspecified: Secondary | ICD-10-CM

## 2021-06-25 ENCOUNTER — Other Ambulatory Visit: Payer: Self-pay

## 2021-06-25 ENCOUNTER — Other Ambulatory Visit (HOSPITAL_COMMUNITY)
Admission: RE | Admit: 2021-06-25 | Discharge: 2021-06-25 | Disposition: A | Discharge status: Home / Self Care | Payer: Medicare Other | Source: Ambulatory Visit | Attending: Internal Medicine | Admitting: Internal Medicine

## 2021-06-25 DIAGNOSIS — Z01818 Encounter for other preprocedural examination: Secondary | ICD-10-CM

## 2021-06-29 ENCOUNTER — Ambulatory Visit (HOSPITAL_BASED_OUTPATIENT_CLINIC_OR_DEPARTMENT_OTHER)
Admission: RE | Admit: 2021-06-29 | Discharge: 2021-06-29 | Disposition: A | Discharge status: Home / Self Care | Payer: Medicare Other | Source: Ambulatory Visit | Attending: Internal Medicine | Admitting: Internal Medicine

## 2021-06-29 ENCOUNTER — Other Ambulatory Visit (HOSPITAL_COMMUNITY): Payer: Self-pay | Admitting: Surgery

## 2021-06-29 ENCOUNTER — Other Ambulatory Visit (HOSPITAL_COMMUNITY)
Admission: RE | Admit: 2021-06-29 | Discharge: 2021-06-29 | Disposition: A | Discharge status: Home / Self Care | Payer: Medicare Other | Source: Ambulatory Visit | Attending: Internal Medicine | Admitting: Internal Medicine

## 2021-06-29 ENCOUNTER — Encounter (HOSPITAL_COMMUNITY): Payer: Self-pay

## 2021-06-29 ENCOUNTER — Other Ambulatory Visit: Payer: Self-pay

## 2021-06-29 ENCOUNTER — Ambulatory Visit (HOSPITAL_COMMUNITY)
Admission: RE | Admit: 2021-06-29 | Discharge: 2021-06-29 | Disposition: A | Discharge status: Home / Self Care | Payer: Medicare Other | Source: Ambulatory Visit | Attending: Internal Medicine | Admitting: Internal Medicine

## 2021-06-29 DIAGNOSIS — I700 Atherosclerosis of aorta: Secondary | ICD-10-CM | POA: Diagnosis not present

## 2021-06-29 DIAGNOSIS — I272 Pulmonary hypertension, unspecified: Secondary | ICD-10-CM

## 2021-06-29 DIAGNOSIS — Z20822 Contact with and (suspected) exposure to covid-19: Secondary | ICD-10-CM | POA: Insufficient documentation

## 2021-06-29 DIAGNOSIS — R0600 Dyspnea, unspecified: Secondary | ICD-10-CM

## 2021-06-29 DIAGNOSIS — R0602 Shortness of breath: Secondary | ICD-10-CM | POA: Diagnosis not present

## 2021-06-29 LAB — SARS CORONAVIRUS 2 BY RT PCR (HOSPITAL ORDER, PERFORMED IN ~~LOC~~ HOSPITAL LAB): SARS Coronavirus 2: NEGATIVE

## 2021-06-29 NOTE — Progress Notes (Unsigned)
Order entered for Stat Covid screen as patient has arrived for PFTs

## 2021-07-01 ENCOUNTER — Other Ambulatory Visit: Payer: Self-pay

## 2021-07-01 ENCOUNTER — Ambulatory Visit (HOSPITAL_COMMUNITY)
Admission: RE | Admit: 2021-07-01 | Discharge: 2021-07-01 | Disposition: A | Discharge status: Home / Self Care | Payer: Medicare Other | Source: Ambulatory Visit | Attending: Internal Medicine | Admitting: Internal Medicine

## 2021-07-01 DIAGNOSIS — I272 Pulmonary hypertension, unspecified: Secondary | ICD-10-CM | POA: Insufficient documentation

## 2021-07-01 DIAGNOSIS — R0600 Dyspnea, unspecified: Secondary | ICD-10-CM | POA: Diagnosis not present

## 2021-07-01 LAB — PULMONARY FUNCTION TEST
DL/VA % pred: 54 %
DL/VA: 2.26 ml/min/mmHg/L
DLCO cor % pred: 35 %
DLCO cor: 6.95 ml/min/mmHg
DLCO unc % pred: 33 %
DLCO unc: 6.53 ml/min/mmHg
FEF 25-75 Post: 1.4 L/sec
FEF 25-75 Pre: 1.04 L/sec
FEF2575-%Change-Post: 34 %
FEF2575-%Pred-Post: 72 %
FEF2575-%Pred-Pre: 53 %
FEV1-%Change-Post: 10 %
FEV1-%Pred-Post: 60 %
FEV1-%Pred-Pre: 55 %
FEV1-Post: 1.4 L
FEV1-Pre: 1.27 L
FEV1FVC-%Change-Post: -6 %
FEV1FVC-%Pred-Pre: 98 %
FEV6-%Change-Post: 17 %
FEV6-%Pred-Post: 68 %
FEV6-%Pred-Pre: 58 %
FEV6-Post: 2 L
FEV6-Pre: 1.7 L
FEV6FVC-%Pred-Post: 104 %
FEV6FVC-%Pred-Pre: 104 %
FVC-%Change-Post: 17 %
FVC-%Pred-Post: 65 %
FVC-%Pred-Pre: 56 %
FVC-Post: 2 L
FVC-Pre: 1.7 L
Post FEV1/FVC ratio: 70 %
Post FEV6/FVC ratio: 100 %
Pre FEV1/FVC ratio: 75 %
Pre FEV6/FVC Ratio: 100 %
RV % pred: 243 %
RV: 5.29 L
TLC % pred: 142 %
TLC: 7.21 L

## 2021-07-01 MED ORDER — ALBUTEROL SULFATE (2.5 MG/3ML) 0.083% IN NEBU
2.5000 mg | INHALATION_SOLUTION | Freq: Once | RESPIRATORY_TRACT | Status: AC
  Administered 2021-07-01: 2.5 mg via RESPIRATORY_TRACT

## 2021-07-02 ENCOUNTER — Ambulatory Visit (HOSPITAL_COMMUNITY)
Admission: RE | Admit: 2021-07-02 | Discharge: 2021-07-02 | Disposition: A | Discharge status: Home / Self Care | Payer: Medicare Other | Attending: Internal Medicine | Admitting: Internal Medicine

## 2021-07-02 ENCOUNTER — Other Ambulatory Visit: Payer: Self-pay

## 2021-07-02 ENCOUNTER — Encounter (HOSPITAL_COMMUNITY)
Admission: RE | Disposition: A | Discharge status: Home / Self Care | Payer: Self-pay | Source: Home / Self Care | Attending: Internal Medicine

## 2021-07-02 ENCOUNTER — Encounter (HOSPITAL_COMMUNITY): Payer: Self-pay | Admitting: Internal Medicine

## 2021-07-02 DIAGNOSIS — I272 Pulmonary hypertension, unspecified: Secondary | ICD-10-CM | POA: Diagnosis not present

## 2021-07-02 DIAGNOSIS — R0600 Dyspnea, unspecified: Secondary | ICD-10-CM

## 2021-07-02 DIAGNOSIS — R0602 Shortness of breath: Secondary | ICD-10-CM | POA: Diagnosis not present

## 2021-07-02 HISTORY — PX: RIGHT/LEFT HEART CATH AND CORONARY ANGIOGRAPHY: CATH118266

## 2021-07-02 LAB — POCT I-STAT EG7
Acid-Base Excess: 3 mmol/L — ABNORMAL HIGH (ref 0.0–2.0)
Acid-Base Excess: 3 mmol/L — ABNORMAL HIGH (ref 0.0–2.0)
Bicarbonate: 29.2 mmol/L — ABNORMAL HIGH (ref 20.0–28.0)
Bicarbonate: 29 mmol/L — ABNORMAL HIGH (ref 20.0–28.0)
Calcium, Ion: 1.17 mmol/L (ref 1.15–1.40)
Calcium, Ion: 1.29 mmol/L (ref 1.15–1.40)
HCT: 30 % — ABNORMAL LOW (ref 36.0–46.0)
HCT: 31 % — ABNORMAL LOW (ref 36.0–46.0)
Hemoglobin: 10.2 g/dL — ABNORMAL LOW (ref 12.0–15.0)
Hemoglobin: 10.5 g/dL — ABNORMAL LOW (ref 12.0–15.0)
O2 Saturation: 72 %
O2 Saturation: 69 %
Potassium: 3.4 mmol/L — ABNORMAL LOW (ref 3.5–5.1)
Potassium: 3.6 mmol/L (ref 3.5–5.1)
Sodium: 142 mmol/L (ref 135–145)
Sodium: 142 mmol/L (ref 135–145)
TCO2: 31 mmol/L (ref 22–32)
TCO2: 31 mmol/L (ref 22–32)
pCO2, Ven: 52.2 mmHg (ref 44.0–60.0)
pCO2, Ven: 53.2 mmHg (ref 44.0–60.0)
pH, Ven: 7.355 (ref 7.250–7.430)
pH, Ven: 7.345 (ref 7.250–7.430)
pO2, Ven: 40 mmHg (ref 32.0–45.0)
pO2, Ven: 39 mmHg (ref 32.0–45.0)

## 2021-07-02 LAB — POCT I-STAT 7, (LYTES, BLD GAS, ICA,H+H)
Acid-Base Excess: 3 mmol/L — ABNORMAL HIGH (ref 0.0–2.0)
Bicarbonate: 28.9 mmol/L — ABNORMAL HIGH (ref 20.0–28.0)
Calcium, Ion: 1.26 mmol/L (ref 1.15–1.40)
HCT: 31 % — ABNORMAL LOW (ref 36.0–46.0)
Hemoglobin: 10.5 g/dL — ABNORMAL LOW (ref 12.0–15.0)
O2 Saturation: 94 %
Potassium: 3.8 mmol/L (ref 3.5–5.1)
Sodium: 142 mmol/L (ref 135–145)
TCO2: 30 mmol/L (ref 22–32)
pCO2 arterial: 48.4 mmHg — ABNORMAL HIGH (ref 32.0–48.0)
pH, Arterial: 7.383 (ref 7.350–7.450)
pO2, Arterial: 75 mmHg — ABNORMAL LOW (ref 83.0–108.0)

## 2021-07-02 SURGERY — RIGHT/LEFT HEART CATH AND CORONARY ANGIOGRAPHY
Anesthesia: LOCAL

## 2021-07-02 MED ORDER — FENTANYL CITRATE (PF) 100 MCG/2ML IJ SOLN
INTRAMUSCULAR | Status: DC | PRN
  Administered 2021-07-02 (×2): 25 ug via INTRAVENOUS

## 2021-07-02 MED ORDER — SODIUM CHLORIDE 0.9 % IV SOLN: 250.0000 mL | INTRAVENOUS | Status: DC | PRN

## 2021-07-02 MED ORDER — SODIUM CHLORIDE 0.9 % IV SOLN: INTRAVENOUS | Status: AC

## 2021-07-02 MED ORDER — SODIUM CHLORIDE 0.9% FLUSH: 3.0000 mL | Freq: Two times a day (BID) | INTRAVENOUS | Status: DC

## 2021-07-02 MED ORDER — FENTANYL CITRATE (PF) 100 MCG/2ML IJ SOLN
INTRAMUSCULAR | Status: AC
  Filled 2021-07-02: qty 2

## 2021-07-02 MED ORDER — ACETAMINOPHEN 325 MG PO TABS: 650.0000 mg | ORAL_TABLET | ORAL | Status: DC | PRN

## 2021-07-02 MED ORDER — ONDANSETRON HCL 4 MG/2ML IJ SOLN: 4.0000 mg | Freq: Four times a day (QID) | INTRAMUSCULAR | Status: DC | PRN

## 2021-07-02 MED ORDER — SODIUM CHLORIDE 0.9% FLUSH: 3.0000 mL | INTRAVENOUS | Status: DC | PRN

## 2021-07-02 MED ORDER — SODIUM CHLORIDE 0.9 % IV SOLN: INTRAVENOUS | Status: DC

## 2021-07-02 MED ORDER — MIDAZOLAM HCL 2 MG/2ML IJ SOLN
INTRAMUSCULAR | Status: DC | PRN
  Administered 2021-07-02 (×3): 1 mg via INTRAVENOUS

## 2021-07-02 MED ORDER — ASPIRIN 81 MG PO CHEW
CHEWABLE_TABLET | ORAL | Status: AC
  Administered 2021-07-02: 81 mg
  Filled 2021-07-02: qty 1

## 2021-07-02 MED ORDER — HYDRALAZINE HCL 20 MG/ML IJ SOLN: 10.0000 mg | INTRAMUSCULAR | Status: DC | PRN

## 2021-07-02 MED ORDER — HEPARIN SODIUM (PORCINE) 1000 UNIT/ML IJ SOLN
INTRAMUSCULAR | Status: DC | PRN
  Administered 2021-07-02: 4500 [IU] via INTRAVENOUS

## 2021-07-02 MED ORDER — LIDOCAINE HCL (PF) 1 % IJ SOLN
INTRAMUSCULAR | Status: AC
  Filled 2021-07-02: qty 30

## 2021-07-02 MED ORDER — IOHEXOL 350 MG/ML SOLN
INTRAVENOUS | Status: DC | PRN
  Administered 2021-07-02: 40 mL

## 2021-07-02 MED ORDER — MIDAZOLAM HCL 2 MG/2ML IJ SOLN
INTRAMUSCULAR | Status: AC
  Filled 2021-07-02: qty 2

## 2021-07-02 MED ORDER — HEPARIN (PORCINE) IN NACL 1000-0.9 UT/500ML-% IV SOLN
INTRAVENOUS | Status: AC
  Filled 2021-07-02: qty 1000

## 2021-07-02 MED ORDER — VERAPAMIL HCL 2.5 MG/ML IV SOLN
INTRAVENOUS | Status: AC
  Filled 2021-07-02: qty 2

## 2021-07-02 MED ORDER — LIDOCAINE HCL (PF) 1 % IJ SOLN
INTRAMUSCULAR | Status: DC | PRN
  Administered 2021-07-02 (×3): 2 mL

## 2021-07-02 MED ORDER — HEPARIN SODIUM (PORCINE) 1000 UNIT/ML IJ SOLN
INTRAMUSCULAR | Status: AC
  Filled 2021-07-02: qty 1

## 2021-07-02 MED ORDER — HEPARIN (PORCINE) IN NACL 1000-0.9 UT/500ML-% IV SOLN
INTRAVENOUS | Status: DC | PRN
  Administered 2021-07-02 (×2): 500 mL

## 2021-07-02 MED ORDER — LABETALOL HCL 5 MG/ML IV SOLN: 10.0000 mg | INTRAVENOUS | Status: DC | PRN

## 2021-07-02 MED ORDER — VERAPAMIL HCL 2.5 MG/ML IV SOLN
INTRAVENOUS | Status: DC | PRN
  Administered 2021-07-02: 10 mL via INTRA_ARTERIAL

## 2021-07-02 SURGICAL SUPPLY — 13 items
CATH 5FR JL3.5 JR4 ANG PIG MP (CATHETERS) ×1 IMPLANT
CATH BALLN WEDGE 5F 110CM (CATHETERS) ×1 IMPLANT
DEVICE RAD COMP TR BAND LRG (VASCULAR PRODUCTS) ×1 IMPLANT
GLIDESHEATH SLEND SS 6F .021 (SHEATH) ×1 IMPLANT
GUIDEWIRE .025 260CM (WIRE) ×2 IMPLANT
GUIDEWIRE INQWIRE 1.5J.035X260 (WIRE) ×1 IMPLANT
INQWIRE 1.5J .035X260CM (WIRE) ×2
KIT MICROPUNCTURE NIT STIFF (SHEATH) ×1 IMPLANT
PACK CARDIAC CATHETERIZATION (CUSTOM PROCEDURE TRAY) ×2 IMPLANT
SHEATH GLIDE SLENDER 4/5FR (SHEATH) ×2 IMPLANT
SHEATH PROBE COVER 6X72 (BAG) ×2 IMPLANT
TRANSDUCER W/STOPCOCK (MISCELLANEOUS) ×2 IMPLANT
WIRE EMERALD 3MM-J .025X260CM (WIRE) ×1 IMPLANT

## 2021-07-02 NOTE — Progress Notes (Signed)
Patient and husband was given discharge instructions. Both verbalized understanding. 

## 2021-07-02 NOTE — Discharge Instructions (Signed)

## 2021-07-02 NOTE — Interval H&P Note (Signed)
History and Physical Interval Note:  07/02/2021 7:42 AM  Regina Berry  has presented today for surgery, with the diagnosis of pulmonary HTN.  The various methods of treatment have been discussed with the patient and family. After consideration of risks, benefits and other options for treatment, the patient has consented to  Procedure(s): RIGHT/LEFT HEART CATH AND CORONARY ANGIOGRAPHY (N/A) and possible coronary angioplasty as a surgical intervention.  The patient's history has been reviewed, patient examined, no change in status, stable for surgery.  I have reviewed the patient's chart and labs.  Questions were answered to the patient's satisfaction.     Emine Lopata

## 2021-09-07 DIAGNOSIS — Z20822 Contact with and (suspected) exposure to covid-19: Secondary | ICD-10-CM | POA: Diagnosis not present

## 2021-09-22 DIAGNOSIS — Z0000 Encounter for general adult medical examination without abnormal findings: Secondary | ICD-10-CM | POA: Diagnosis not present

## 2021-09-22 DIAGNOSIS — I10 Essential (primary) hypertension: Secondary | ICD-10-CM | POA: Diagnosis not present

## 2021-09-22 DIAGNOSIS — I7300 Raynaud's syndrome without gangrene: Secondary | ICD-10-CM | POA: Diagnosis not present

## 2021-09-22 DIAGNOSIS — E039 Hypothyroidism, unspecified: Secondary | ICD-10-CM | POA: Diagnosis not present

## 2021-09-22 DIAGNOSIS — G894 Chronic pain syndrome: Secondary | ICD-10-CM | POA: Diagnosis not present

## 2021-09-22 DIAGNOSIS — E6609 Other obesity due to excess calories: Secondary | ICD-10-CM | POA: Diagnosis not present

## 2021-09-22 DIAGNOSIS — Z23 Encounter for immunization: Secondary | ICD-10-CM | POA: Diagnosis not present

## 2021-09-22 DIAGNOSIS — Z6835 Body mass index (BMI) 35.0-35.9, adult: Secondary | ICD-10-CM | POA: Diagnosis not present

## 2021-09-22 DIAGNOSIS — Z1331 Encounter for screening for depression: Secondary | ICD-10-CM | POA: Diagnosis not present

## 2021-09-22 DIAGNOSIS — F419 Anxiety disorder, unspecified: Secondary | ICD-10-CM | POA: Diagnosis not present

## 2021-09-29 IMAGING — MG MM DIGITAL SCREENING BILAT W/ TOMO AND CAD
6 of 10 series · 6 of 30 positions shown · non-contrast
Comparison: Previous exam(s).

CLINICAL DATA: Screening.

EXAM:
DIGITAL SCREENING BILATERAL MAMMOGRAM WITH TOMOSYNTHESIS AND CAD
TECHNIQUE: Bilateral screening digital craniocaudal and mediolateral oblique
mammograms were obtained. Bilateral screening digital breast
tomosynthesis was performed. The images were evaluated with
computer-aided detection.

[R MLO synth-2D (1 of 2)]
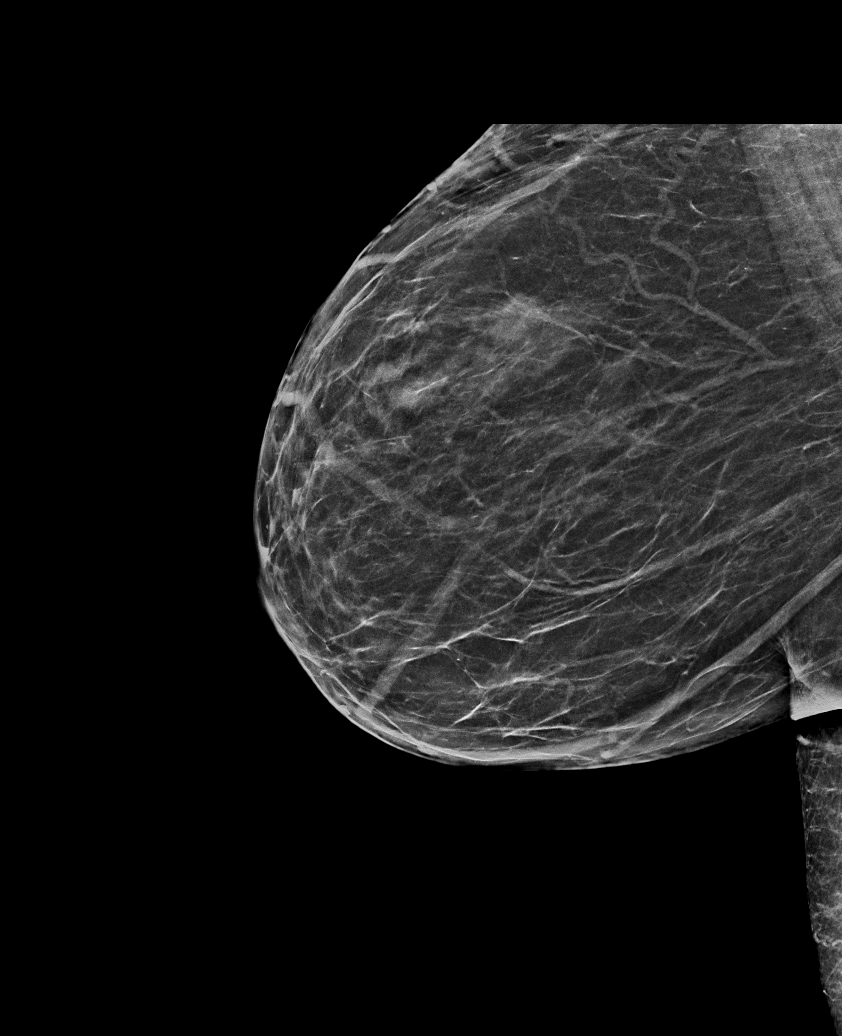

[L MLO synth-2D]
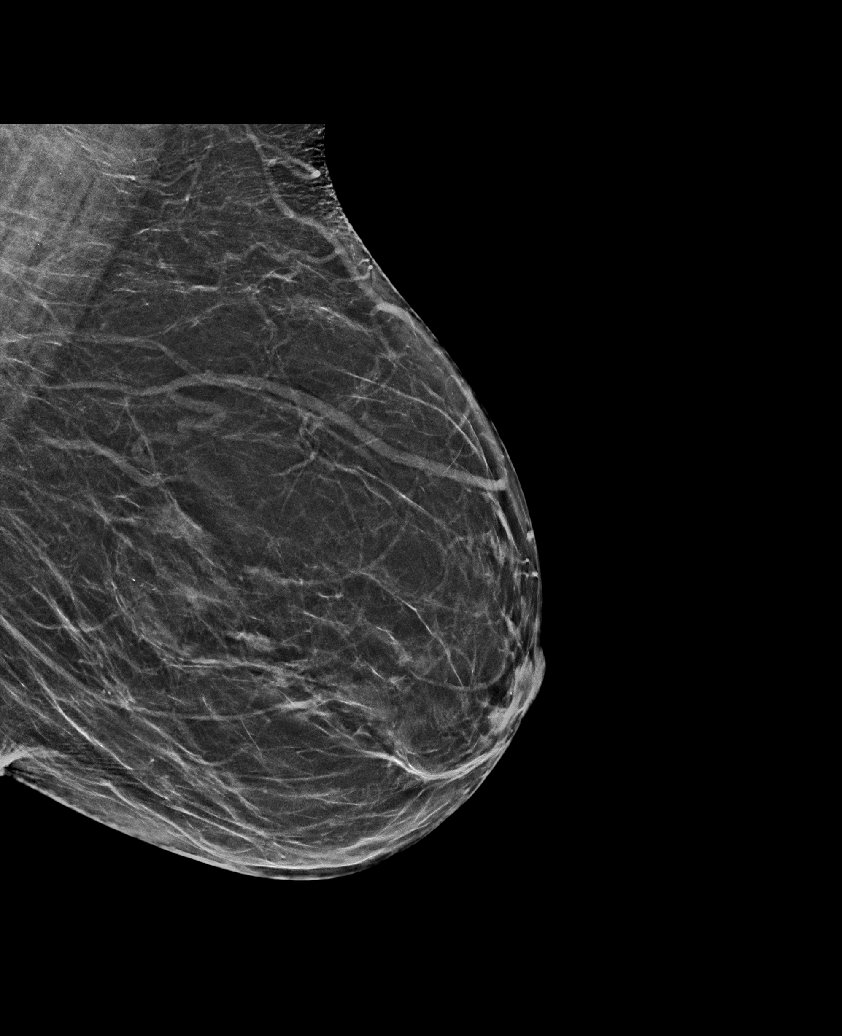

[R MLO synth-2D (2 of 2)]
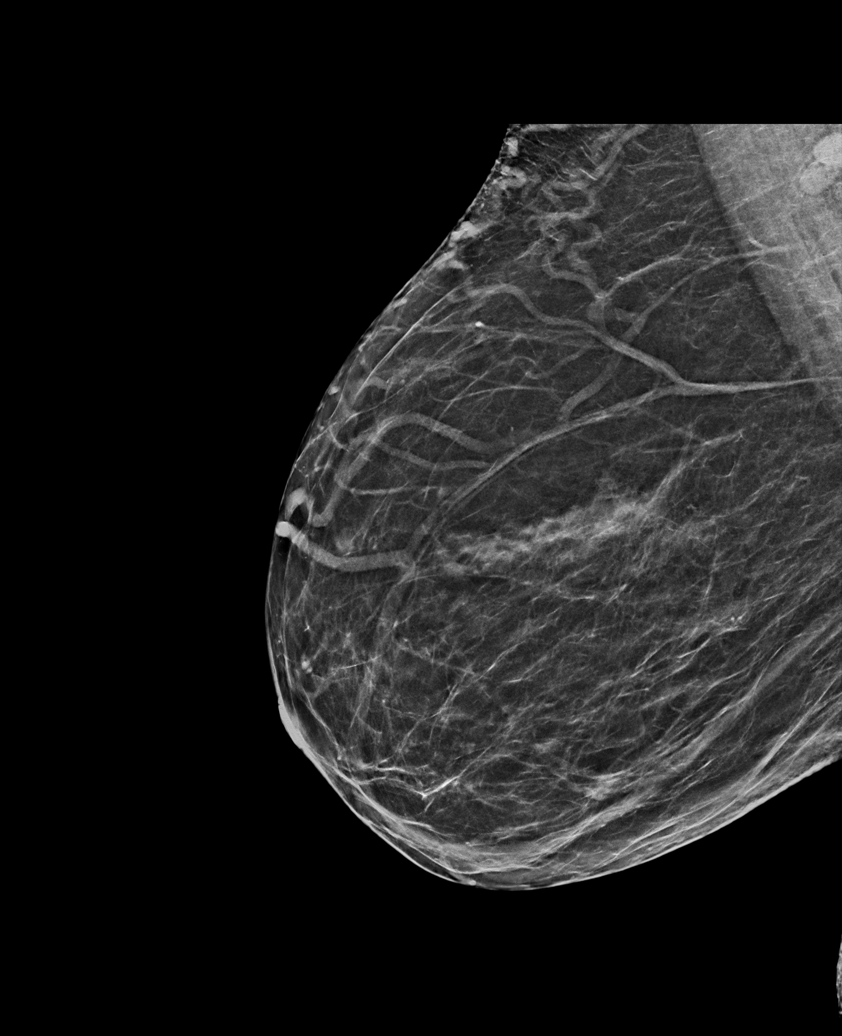

[L CC synth-2D]
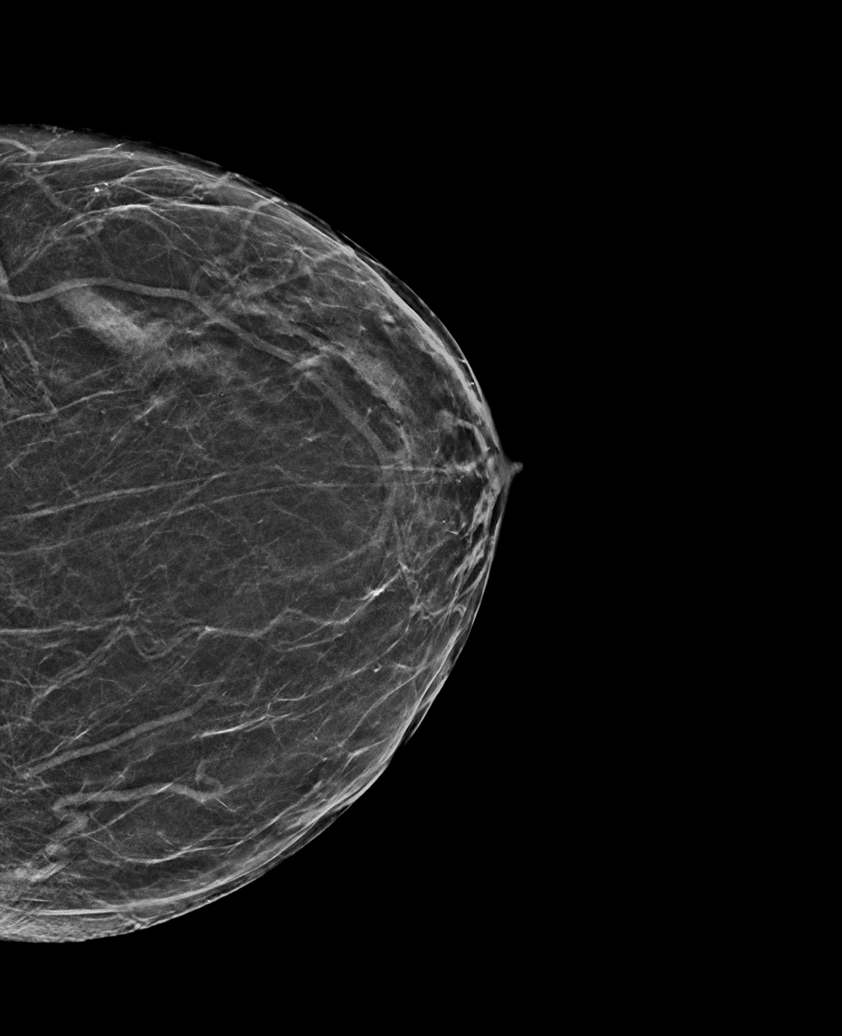

[R CC synth-2D]
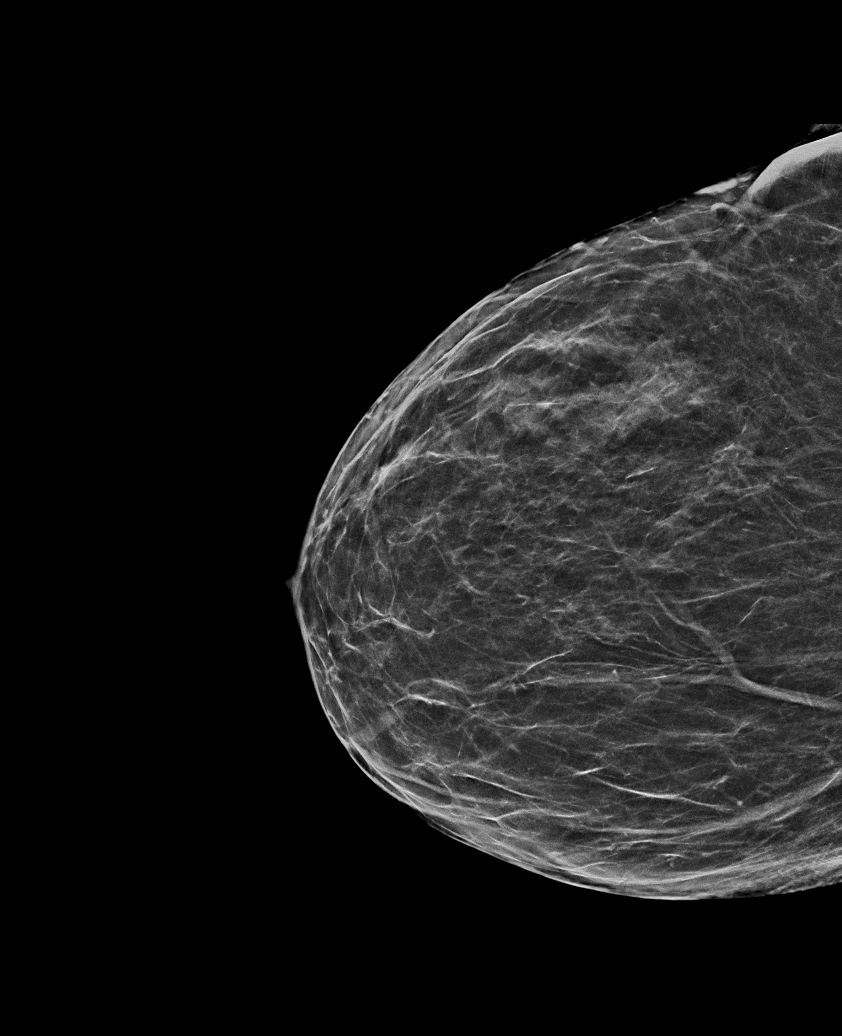

[R CC tomo · tomo slice 29/57.0]
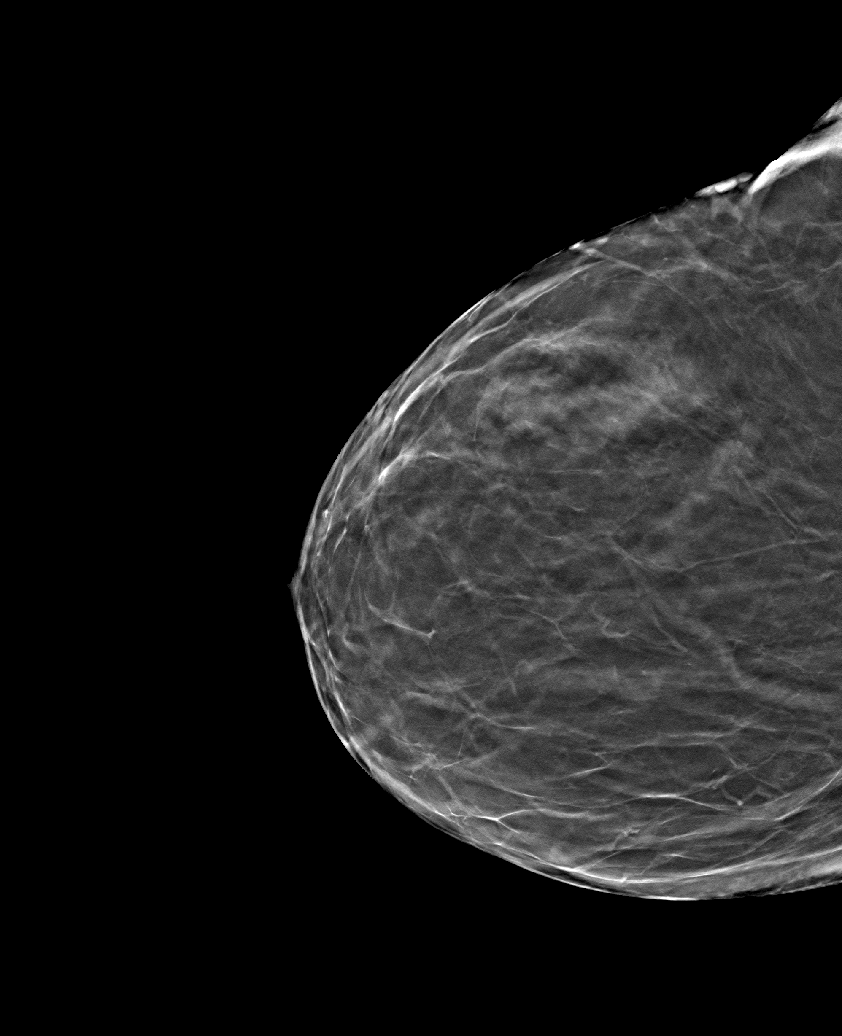

[6 of 30 positions shown; findings below may reference images not displayed]

ACR Breast Density Category b: There are scattered areas of
fibroglandular density.
FINDINGS: There are no findings suspicious for malignancy. The images were
evaluated with computer-aided detection.
IMPRESSION: No mammographic evidence of malignancy. A result letter of this
screening mammogram will be mailed directly to the patient.

RECOMMENDATION:
Screening mammogram in one year. (Code:WJ-I-BG6)

BI-RADS CATEGORY  1: Negative.

## 2021-10-13 ENCOUNTER — Ambulatory Visit (INDEPENDENT_AMBULATORY_CARE_PROVIDER_SITE_OTHER): Payer: Medicare Other

## 2021-10-13 ENCOUNTER — Ambulatory Visit
Admission: EM | Admit: 2021-10-13 | Discharge: 2021-10-13 | Disposition: A | Discharge status: Home / Self Care | Payer: Medicare Other | Attending: Family Medicine | Admitting: Family Medicine

## 2021-10-13 ENCOUNTER — Other Ambulatory Visit: Payer: Self-pay

## 2021-10-13 DIAGNOSIS — M79672 Pain in left foot: Secondary | ICD-10-CM

## 2021-10-13 DIAGNOSIS — R600 Localized edema: Secondary | ICD-10-CM | POA: Diagnosis not present

## 2021-10-13 DIAGNOSIS — M7989 Other specified soft tissue disorders: Secondary | ICD-10-CM | POA: Diagnosis not present

## 2021-10-13 NOTE — ED Provider Notes (Signed)
[facility]   [id number] 10/13/21 Arrival Time: 5465  ASSESSMENT & PLAN:  1. Left foot pain    I have personally viewed the imaging studies ordered this visit. No LEFT foot fractures appreciated.  WBAT. OTC analgesics as needed.  Orders Placed This Encounter  Procedures   DG Foot Complete Left    Recommend:  Follow-up Information     Sharilyn Sites, MD.   Specialty: Family Medicine Why: As needed. Contact information: [address] [place] [address] [phone]                Reviewed expectations re: course of current medical issues. Questions answered. Outlined signs and symptoms indicating need for more acute intervention. Patient verbalized understanding. After Visit Summary given.  SUBJECTIVE: History from: patient. Regina Berry is a 71 y.o. female who reports LEFT foot pain; distal; after kicking cabinet; 5 d ago. Able to bear weight "but sore". No tx PTA. No extremity sensation changes or weakness.   Past Surgical History:  Procedure Laterality Date   APPLICATION OF WOUND VAC Left 05/15/2020   Procedure: APPLICATION OF WOUND VAC;  Surgeon: Marchia Bond, MD;  [facility]: [facility];  Service: Orthopedics;  Laterality: Left;   CESAREAN SECTION     COLONOSCOPY N/A 09/11/2015   Procedure: COLONOSCOPY;  Surgeon: Rogene Houston, MD;  [facility];  Service: Endoscopy;  Laterality: N/A;  moved to 1/6 @ 1:35 - Ann to notify pt   EYE SURGERY Bilateral 2021   INCISION AND DRAINAGE HIP Left 05/15/2020   Procedure: IRRIGATION AND DEBRIDEMENT HIP LEFT HIP, POSSIBLE POLLY EXCHANGE;  Surgeon: Marchia Bond, MD;  [facility];  Service: Orthopedics;  Laterality: Left;   LUNG SURGERY Left    infection on outside of lung that had to be"scaped off".   RECTOCELE REPAIR N/A 01/02/2018   Procedure: POSTERIOR REPAIR (RECTOCELE);  Surgeon: Jonnie Kind, MD;  Location: [facility];  Service: Gynecology;  Laterality: N/A;   RIGHT/LEFT HEART  CATH AND CORONARY ANGIOGRAPHY N/A 07/02/2021   Procedure: RIGHT/LEFT HEART CATH AND CORONARY ANGIOGRAPHY;  Surgeon: Jolaine Artist, MD;  Location: [facility];  Service: Cardiovascular;  Laterality: N/A;   TOTAL HIP ARTHROPLASTY Left 03/17/2020   Procedure: TOTAL HIP ARTHROPLASTY;  Surgeon: Marchia Bond, MD;  [facility];  Service: Orthopedics;  Laterality: Left;   TOTAL HIP ARTHROPLASTY Left 05/13/2020   Procedure: TOTAL HIP ARTHROPLASTY IRRIGATION AND DEBRIDEMENT, POLY EXCHANGE;  Surgeon: Marchia Bond, MD;  [facility];  Service: Orthopedics;  Laterality: Left;      OBJECTIVE:  Vitals:   10/13/21 1505  BP: 134/62  Pulse: 64  Resp: 18  Temp: 98.1 F (36.7 C)  TempSrc: Oral  SpO2: 96%    General appearance: alert; no distress HEENT: Gas; AT Neck: supple with FROM Resp: unlabored respirations Extremities: LLE: warm with well perfused appearance; poorly localized mild pain over left dorsal/distal foot Skin: warm and dry; no visible rashes Neurologic: gait normal; normal sensation and strength of LLE Psychological: alert and cooperative; normal mood and affect  Imaging: DG Foot Complete Left  Result Date: 10/13/2021 CLINICAL DATA:  Injury 5 days ago, second and third metatarsophalangeal joint pain and swelling EXAM: LEFT FOOT - COMPLETE 3+ VIEW COMPARISON:  None. FINDINGS: Frontal, oblique, and lateral views of the left foot are obtained. No acute fracture, subluxation, or dislocation. Mild diffuse osteoarthritis throughout the interphalangeal joints. Small superior and inferior calcaneal spurs. Mild diffuse soft tissue edema. IMPRESSION: 1.  No acute fracture. 2. Osteoarthritis. 3. Mild diffuse soft tissue swelling. Electronically Signed   By: Randa Ngo M.D.   On: 10/13/2021 15:24      Allergies  Allergen Reactions   Codeine Itching    Other reaction(s): itching    Past Medical History:  Diagnosis Date   Anemia    Anxiety    Arthritis    Lt hip    Cancer ([place]) 2020   skin  Rt leg    Chronic bronchitis (HCC)    COPD (chronic obstructive pulmonary disease) (HCC)    Dyspnea    GERD (gastroesophageal reflux disease)    Hypertension    Hypothyroidism    Obesity (BMI 30-39.9) 12/21/2017   OSA on CPAP 12/21/2017   Severe with AHI 29.4/h and oxygen desaturations as low as 75%.  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[place])    Thyroid disease    Social History   Socioeconomic History   Marital status: Married    Spouse name: Not on file   Number of children: Not on file   Years of education: Not on file   Highest education level: Not on file  Occupational History   Not on file  Tobacco Use   Smoking status: Former    Packs/day: 1.00    Years: 40.00    Pack years: 40.00    Types: Cigarettes    Quit date: 07/2020    Years since quitting: 1.2   Smokeless tobacco: Never  Vaping Use   Vaping Use: Former  Substance and Sexual Activity   Alcohol use: Yes    Alcohol/week: 0.0 standard drinks    Comment: occasionally   Drug use: No   Sexual activity: Not Currently    Birth control/protection: Post-menopausal  Other Topics Concern   Not on file  Social History Narrative   Not on file   Social Determinants of Health   Financial Resource Strain: Low Risk    Difficulty of Paying Living Expenses: Not very hard  Food Insecurity: No Food Insecurity   Worried About Running Out of Food in the Last Year: Never true   Ran Out of Food in the Last Year: Never true  Transportation Needs: No Transportation Needs   Lack of Transportation (Medical): No   Lack of Transportation (Non-Medical): No  Physical Activity: Insufficiently Active   Days of Exercise per Week: 1 day   Minutes of Exercise per Session: 20 min  Stress: No Stress Concern Present   Feeling of  Stress : Not at all  Social Connections: Moderately Integrated   Frequency of Communication with Friends and Family: More than three times a week   Frequency of Social Gatherings with Friends and Family: Once a week   Attends Religious Services: More than 4 times per year   Active Member of [employer] or Organizations: No   Attends Archivist Meetings: Never   Marital Status: Married   Family History  Problem Relation Age of Onset   Diabetes Mother    Cancer Mother        ovarian   COPD Mother    Diabetes Father    Cancer Father        lymphoma   Heart disease Father    Other Daughter  transverse mylitis   Past Surgical History:  Procedure Laterality Date   APPLICATION OF WOUND VAC Left 05/15/2020   Procedure: APPLICATION OF WOUND VAC;  Surgeon: Marchia Bond, MD;  [facility];  Service: Orthopedics;  Laterality: Left;   CESAREAN SECTION     COLONOSCOPY N/A 09/11/2015   Procedure: COLONOSCOPY;  Surgeon: Rogene Houston, MD;  [facility];  Service: Endoscopy;  Laterality: N/A;  moved to 1/6 @ 1:35 - Ann to notify pt   EYE SURGERY Bilateral 2021   INCISION AND DRAINAGE HIP Left 05/15/2020   Procedure: IRRIGATION AND DEBRIDEMENT HIP LEFT HIP, POSSIBLE POLLY EXCHANGE;  Surgeon: Marchia Bond, MD;  [facility];  Service: Orthopedics;  Laterality: Left;   LUNG SURGERY Left    infection on outside of lung that had to be"scaped off".   RECTOCELE REPAIR N/A 01/02/2018   Procedure: POSTERIOR REPAIR (RECTOCELE);  Surgeon: Jonnie Kind, MD;  [facility];  Service: Gynecology;  Laterality: N/A;   RIGHT/LEFT HEART CATH AND CORONARY ANGIOGRAPHY N/A 07/02/2021   Procedure: RIGHT/LEFT HEART CATH AND CORONARY ANGIOGRAPHY;  Surgeon: Jolaine Artist, MD;  Location: [facility];  Service: Cardiovascular;  Laterality: N/A;   TOTAL HIP ARTHROPLASTY Left 03/17/2020   Procedure: TOTAL HIP ARTHROPLASTY;  Surgeon: Marchia Bond, MD;  [facility];   Service: Orthopedics;  Laterality: Left;   TOTAL HIP ARTHROPLASTY Left 05/13/2020   Procedure: TOTAL HIP ARTHROPLASTY IRRIGATION AND DEBRIDEMENT, POLY EXCHANGE;  Surgeon: Marchia Bond, MD;  [facility];  Service: Orthopedics;  Laterality: Left;       Vanessa Kick, MD 10/13/21 1558

## 2021-10-13 NOTE — ED Triage Notes (Signed)
Pt reports pain and swelling in the left foot x 5 days after she kicked a cabinet.

## 2021-10-13 NOTE — Discharge Instructions (Signed)
If not allergic, you may use over the counter ibuprofen or acetaminophen as needed.  Your x-ray did not show any broken bones.

## 2021-10-21 ENCOUNTER — Telehealth: Payer: Self-pay | Admitting: Internal Medicine

## 2021-10-21 NOTE — Telephone Encounter (Signed)
Front desk  Dr Jeffie Pollock sent a message some time back. PAtient has scleroderma with abnormal PFT. Last PFt OCt 2022. CT at that time shoed possible ILD  Plan  - get spiro/dlco and plan a 30 min visit - first avail  PFT Results Latest Ref Rng & Units 06/29/2021 07/19/2017 07/13/2016 09/12/2014  FVC-Pre L 1.70 1.44 - 2.12  FVC-Predicted Pre % 56 46 - 68  FVC-Post L 2.00 1.49 - 2.07  FVC-Predicted Post % 65 47 - 66  Pre FEV1/FVC % % 75 79 - 74  Post FEV1/FCV % % 70 76 - 77  FEV1-Pre L 1.27 1.14 - 1.58  FEV1-Predicted Pre % 55 47 - 66  FEV1-Post L 1.40 1.13 - 1.59  DLCO uncorrected ml/min/mmHg 6.53 10.43 14.54 14.68  DLCO UNC% % 33 42 63 64  DLCO corrected ml/min/mmHg 6.95 10.43 - 14.68  DLCO COR %Predicted % 35 42 - 64  DLVA Predicted % 54 62 81 82  TLC L 7.21 4.34 - 4.64  TLC % Predicted % 142 85 - 94  RV % Predicted % 243 134 - 119

## 2021-10-22 NOTE — Telephone Encounter (Signed)
Left voicemail for patient to back to schedule consultation and 30 minute PFT

## 2021-10-26 ENCOUNTER — Encounter (HOSPITAL_COMMUNITY): Payer: Medicare Other | Admitting: Internal Medicine

## 2021-10-27 DIAGNOSIS — B079 Viral wart, unspecified: Secondary | ICD-10-CM | POA: Diagnosis not present

## 2021-10-27 NOTE — Telephone Encounter (Signed)
Patient is scheduled 11/23/2021 at 930 for spiro/ dlco and 10am consultation. Nothing further needed.

## 2021-11-09 ENCOUNTER — Institutional Professional Consult (permissible substitution): Payer: Medicare Other | Admitting: Internal Medicine

## 2021-11-22 ENCOUNTER — Other Ambulatory Visit: Payer: Self-pay | Admitting: Internal Medicine

## 2021-11-22 DIAGNOSIS — G4733 Obstructive sleep apnea (adult) (pediatric): Secondary | ICD-10-CM

## 2021-11-23 ENCOUNTER — Ambulatory Visit (INDEPENDENT_AMBULATORY_CARE_PROVIDER_SITE_OTHER): Payer: Medicare Other | Admitting: Internal Medicine

## 2021-11-23 ENCOUNTER — Encounter: Payer: Self-pay | Admitting: Internal Medicine

## 2021-11-23 ENCOUNTER — Other Ambulatory Visit: Payer: Self-pay

## 2021-11-23 VITALS — BP 122/70 | HR 71 | Temp 97.8°F | Ht 64.0 in | Wt 208.6 lb

## 2021-11-23 DIAGNOSIS — J849 Interstitial pulmonary disease, unspecified: Secondary | ICD-10-CM | POA: Diagnosis not present

## 2021-11-23 DIAGNOSIS — Z825 Family history of asthma and other chronic lower respiratory diseases: Secondary | ICD-10-CM

## 2021-11-23 DIAGNOSIS — Z87898 Personal history of other specified conditions: Secondary | ICD-10-CM

## 2021-11-23 DIAGNOSIS — K219 Gastro-esophageal reflux disease without esophagitis: Secondary | ICD-10-CM

## 2021-11-23 DIAGNOSIS — R942 Abnormal results of pulmonary function studies: Secondary | ICD-10-CM | POA: Diagnosis not present

## 2021-11-23 DIAGNOSIS — Z87891 Personal history of nicotine dependence: Secondary | ICD-10-CM

## 2021-11-23 DIAGNOSIS — Z9989 Dependence on other enabling machines and devices: Secondary | ICD-10-CM

## 2021-11-23 DIAGNOSIS — R0609 Other forms of dyspnea: Secondary | ICD-10-CM

## 2021-11-23 DIAGNOSIS — Z8739 Personal history of other diseases of the musculoskeletal system and connective tissue: Secondary | ICD-10-CM

## 2021-11-23 DIAGNOSIS — G4733 Obstructive sleep apnea (adult) (pediatric): Secondary | ICD-10-CM

## 2021-11-23 LAB — CBC WITH DIFFERENTIAL/PLATELET
Basophils Absolute: 0 10*3/uL (ref 0.0–0.1)
Basophils Relative: 0.6 % (ref 0.0–3.0)
Eosinophils Absolute: 0.4 10*3/uL (ref 0.0–0.7)
Eosinophils Relative: 5 % (ref 0.0–5.0)
HCT: 40.1 % (ref 36.0–46.0)
Hemoglobin: 13.1 g/dL (ref 12.0–15.0)
Lymphocytes Relative: 18.6 % (ref 12.0–46.0)
Lymphs Abs: 1.4 10*3/uL (ref 0.7–4.0)
MCHC: 32.5 g/dL (ref 30.0–36.0)
MCV: 86.6 fl (ref 78.0–100.0)
Monocytes Absolute: 0.8 10*3/uL (ref 0.1–1.0)
Monocytes Relative: 10.6 % (ref 3.0–12.0)
Neutro Abs: 5 10*3/uL (ref 1.4–7.7)
Neutrophils Relative %: 65.2 % (ref 43.0–77.0)
Platelets: 235 10*3/uL (ref 150.0–400.0)
RBC: 4.63 Mil/uL (ref 3.87–5.11)
RDW: 14.9 % (ref 11.5–15.5)
WBC: 7.6 10*3/uL (ref 4.0–10.5)

## 2021-11-23 LAB — PULMONARY FUNCTION TEST
DL/VA % pred: 76 %
DL/VA: 3.15 ml/min/mmHg/L
DLCO cor % pred: 61 %
DLCO cor: 11.98 ml/min/mmHg
DLCO unc % pred: 61 %
DLCO unc: 11.98 ml/min/mmHg
FEF 25-75 Pre: 0.67 L/sec
FEF2575-%Pred-Pre: 35 %
FEV1-%Pred-Pre: 54 %
FEV1-Pre: 1.24 L
FEV1FVC-%Pred-Pre: 90 %
FEV6-%Pred-Pre: 63 %
FEV6-Pre: 1.81 L
FEV6FVC-%Pred-Pre: 104 %
FVC-%Pred-Pre: 60 %
FVC-Pre: 1.81 L
Pre FEV1/FVC ratio: 69 %
Pre FEV6/FVC Ratio: 100 %

## 2021-11-23 LAB — BRAIN NATRIURETIC PEPTIDE: Pro B Natriuretic peptide (BNP): 65 pg/mL (ref 0.0–100.0)

## 2021-11-23 NOTE — Progress Notes (Signed)
Spirometry and Dlco done today. 

## 2021-11-23 NOTE — Progress Notes (Signed)
? ? ? ? ?OV 11/23/2021 -new consult at the [facility] with Dr. Chase Caller.  Referred by Dr. Glori Bickers ? ?Subjective:  ?Patient ID: Regina Berry, female , DOB: Mar 13, 1951 , age 71 y.o. , MRN: [id number] , AD[address]ESS: [address] ?[place] [address] ?PCP Sharilyn Sites, MD ?Patient Care Team: ?Sharilyn Sites, MD as PCP - General (Family Medicine) ? ?This Provider for this visit: Treatment Team:  ?Attending Provider: Brand Males, MD ? ? ? ?11/23/2021 -   ?Chief Complaint  ?Patient presents with  ? Consult  ?  PFT performed today.  Pt states that she does have complaints of SOB that is worse with exertion.  ? ? ? ?HPI ?Regina Berry 71 y.o. -is a former 40 pack tobacco smoker.  Lives in [place].  Retired from working in the tobacco plant.  As she has a history of CPAP usage because of sleep apnea.  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  Some 30 years ago she developed Raynaud's with whitening of her fingers.  And then 25 years ago she got diagnosed with scleroderma.  She is just on observation therapy.  The Raynaud's has improved over time.  Usually her fingers just turn pale white.  She has also had acid reflux for a long time.  She also has chronic pedal edema that is stable.  She is only on observation therapy. ? ?Some 2 years ago because of ongoing mobility restrictions of her left hip she underwent hip surgery.  She has had total of 5 surgeries there including infection.  She always walks with a limp but her mobility did go up after the hip surgery and then she started noticing insidious onset of shortness of breath.  Always present with exertion.  Variable distances sometimes 20 feet sometimes longer.  But always present with exertion and relieved by rest.  There is associated wheezing with this but there is no cough.  She has had intermittent wheezing for the last 18 months.  Early in the morning and with exertion. ? ?She does  have chronic acid reflux. ? ?She has had serial pulmonary function test for the last 7 years.  On an average the FVC has declined 2 %/year.  The DLCO is also declined.  She had a right heart catheterization but her PVR is normal in October 2022 and therefore Dr. Haroldine Laws has not committed her to antipulm hypertension meds.  There is concern of ILD so she had a high-resolution CT chest that I personally visualized and showed her.  There is some bilateral bibasal mild ILD.  In 2018 she did have low-dose CT scan of the chest without contrast and there is ILD and that 2.  I personally think the level of ILD is the same but the comparisons are not accurate because the 2018 CT is not a high-res CT. ? ?Review of the labs also shows chronic eosinophilia.  So we did a exam nitric oxide for asthma today especially given the history of wheezing.  She also tells me that multiple family members have asthma.  The nitric oxide test is 8 and normal. ? ?Value systems: Overall prefers conservative line of management balancing risks and benefits. ? ?CT Chest data ? ?No results found. ? ? ? ?PFT ? ?PFT Results Latest Ref Rng & Units 11/23/2021 06/29/2021 07/19/2017 07/13/2016 09/12/2014  ?FVC-Pre L 1.81 1.70 1.44 - 2.12  ?FVC-Predicted Pre % 60 56 46 -  68  ?FVC-Post L - 2.00 1.49 - 2.07  ?FVC-Predicted Post % - 65 47 - 66  ?Pre FEV1/FVC % % 69 75 79 - 74  ?Post FEV1/FCV % % - 70 76 - 77  ?FEV1-Pre L 1.24 1.27 1.14 - 1.58  ?FEV1-Predicted Pre % 54 55 47 - 66  ?FEV1-Post L - 1.40 1.13 - 1.59  ?DLCO uncorrected ml/min/mmHg 11.98 6.53 10.43 14.54 14.68  ?DLCO UNC% % 61 33 42 63 64  ?DLCO corrected ml/min/mmHg 11.98 6.95 10.43 - 14.68  ?DLCO COR %Predicted % 61 35 42 - 64  ?DLVA Predicted % 76 54 62 81 82  ?TLC L - 7.21 4.34 - 4.64  ?TLC % Predicted % - 142 85 - 94  ?RV % Predicted % - 243 134 - 119  ? ? ?[place] Oct 2022 ? ?  The left ventricular ejection fraction is 55-65% by visual estimate. ?  ?Findings: ?  ?Ao = 163/80 (115) ?LV = 159/19 ?RA  =  8 ?RV = 38/12 ?PA = 35/17 (25)    ?PCW = 15 ?Fick cardiac output/index = 7.2/3.7 ?PVR = 1.4 WU ?FA sat = 94% ?PA sat = 69%, 72% ?  ?Assessment: ?1. Normal coronary arteries ?2. Normal LV function ?3. Mildly increased pulmonary pressures with high cardiac output and normal PVR ?  ?Plan/Discussion: ?  ?Continue RF management. With normal PVR not candidate for selective pulmonary artery vasodilators.  ?  ?Glori Bickers, MD  ?9:44 AM ? ?CT HRCT Oct 2022 ? ?Narrative & Impression  ?CLINICAL DATA:  71 year old female with history of chronic shortness ?of breath. Evaluate for interstitial lung disease. ?  ?EXAM: ?CT CHEST WITHOUT CONTRAST ?  ?TECHNIQUE: ?Multidetector CT imaging of the chest was performed following the ?standard protocol without intravenous contrast. High resolution ?imaging of the lungs, as well as inspiratory and expiratory imaging, ?was performed. ?  ?COMPARISON:  Low-dose lung cancer screening chest CT 03/29/2017. ?  ?FINDINGS: ?Cardiovascular: Heart size is borderline enlarged. There is no ?significant pericardial fluid, thickening or pericardial ?calcification. There is aortic atherosclerosis, as well as ?atherosclerosis of the great vessels of the mediastinum and the ?coronary arteries, including calcified atherosclerotic plaque in the ?left anterior descending, left circumflex and right coronary ?arteries. Mild calcifications of the aortic valve. Mild ?calcifications of the mitral annulus. ?  ?Mediastinum/Nodes: No pathologically enlarged mediastinal or hilar ?lymph nodes. Several prominent borderline enlarged mediastinal lymph ?nodes are noted, similar to the prior study, presumably benign, ?measuring up to 1.3 cm in short axis in the low right paratracheal ?nodal station. Esophagus is unremarkable in appearance. No axillary ?lymphadenopathy. ?  ?Lungs/Pleura: High-resolution images demonstrates some very mild ?peripheral predominant areas of ground-glass attenuation and septal ?thickening  evident only in the extreme lung bases. These appear new ?compared to the prior examination. No traction bronchiectasis or ?honeycombing. Inspiratory and expiratory imaging demonstrates some ?mild air trapping indicative of mild small airways disease. No acute ?consolidative airspace disease. No pleural effusions. No definite ?suspicious appearing pulmonary nodules or masses are noted. ?  ?Upper Abdomen: Aortic atherosclerosis. Subcentimeter low-attenuation ?lesion in segment 8 of the liver, incompletely characterized on ?today's non-contrast CT examination, but statistically likely to ?represent a small cyst. ?  ?Musculoskeletal: There are no aggressive appearing lytic or blastic ?lesions noted in the visualized portions of the skeleton. ?  ?IMPRESSION: ?1. Very subtle findings in the extreme lung bases which could be ?indicative of early or mild interstitial lung disease categorized as ?indeterminate for usual interstitial pneumonia (UIP)  per current ATS ?guidelines. ?2. Aortic atherosclerosis, in addition to 3 vessel coronary artery ?disease. Please note that although the presence of coronary artery ?calcium documents the presence of coronary artery disease, the ?severity of this disease and any potential stenosis cannot be ?assessed on this non-gated CT examination. Assessment for potential ?risk factor modification, dietary therapy or pharmacologic therapy ?may be warranted, if clinically indicated. ?3. There are calcifications of the aortic valve and mitral annulus. ?Echocardiographic correlation for evaluation of potential valvular ?dysfunction may be warranted if clinically indicated. ?  ?Aortic Atherosclerosis (ICD10-I70.0). ?  ?  ?Electronically Signed ?  By: Vinnie Langton M.D. ?  On: 07/01/2021 12:53 ?   ? ? Latest Reference Range & Units 05/13/20 16:30 07/27/20 14:27 08/10/20 16:40 08/30/20 11:27  ?Eosinophils Absolute 0.0 - 0.5 K/uL 0.0 0.7 (H) 0.9 (H) 0.3  ?(H): Data is abnormally high ? has a past  medical history of Anemia, Anxiety, Arthritis, Cancer ([place]) (2020), Chronic bronchitis (HCC), COPD (chronic obstructive pulmonary disease) ([place]), Dyspnea, GERD (gastroesophageal reflux disease), Hyperte

## 2021-11-23 NOTE — Addendum Note (Signed)
Addended by: Brand Males on: 11/23/2021 12:27 PM ? ? Modules accepted: Level of Service ? ?

## 2021-11-23 NOTE — Patient Instructions (Addendum)
ICD-10-CM   ?1. DOE (dyspnea on exertion)  R06.09   ?  ?2. History of wheezing  Z87.898   ?  ?3. History of scleroderma  Z87.39   ?  ?4. Gastroesophageal reflux disease, unspecified whether esophagitis present  K21.9   ?  ?5. Abnormal PFT  R94.2   ?  ?6. ILD (interstitial lung disease) ([place])  J84.9   ?  ?7. Family history of asthma  Z82.5   ?  ?8. Stopped smoking with greater than 40 pack year history  Z87.891   ?  ? ?There is mild interstitial lung disease and CT scan October 2022 which I believe is similar to 2018 CT scan of the chest. I do not believe the level of fibrosis is greater than 10%.  However pulmonary function test shows a slow very mild decline over time -approximately 2% reduction in capacity per year over 7 years.  Technically does not meet definition for progressive phenotype ? ?There is wheezing raising the question of associated asthma especially with family history ? ?Plan -based on shared decision making ?- Check for allergic asthma-RAST allergy panel, CBC with differential, blood IgE and exam nitric oxide test ?-Rule out diastolic dysfunction: Check BNP ?-Evaluation for antifibrotic treatment ? -At this point in time I recommend continued observation without starting antifibrotic treatment but will discuss in a case conference in the next few months ?-Control acid reflux ? -Sleep with head end of the bed elevated ? -Recommend taking PPI [currently on none] ? ?Follow-up ?-We will indicate results of the blood test and if abnormal we will have you come in sooner ?-3 months to full pulmonary function test including bronchodilator response ?-Return to see Dr. Chase Caller in 3 months or sooner if needed ?-ILD symptom score and simple walking desaturation test at follow-up ?- ? ?

## 2021-11-24 LAB — RESPIRATORY ALLERGY PROFILE REGION II ~~LOC~~

## 2021-11-24 LAB — INTERPRETATION:

## 2021-12-03 DIAGNOSIS — E782 Mixed hyperlipidemia: Secondary | ICD-10-CM | POA: Diagnosis not present

## 2021-12-03 DIAGNOSIS — E039 Hypothyroidism, unspecified: Secondary | ICD-10-CM | POA: Diagnosis not present

## 2021-12-29 ENCOUNTER — Encounter (HOSPITAL_COMMUNITY): Payer: Self-pay | Admitting: Internal Medicine

## 2021-12-29 ENCOUNTER — Ambulatory Visit (HOSPITAL_COMMUNITY)
Admission: RE | Admit: 2021-12-29 | Discharge: 2021-12-29 | Disposition: A | Discharge status: Home / Self Care | Payer: Medicare Other | Source: Ambulatory Visit | Attending: Internal Medicine | Admitting: Internal Medicine

## 2021-12-29 VITALS — BP 164/82 | HR 70 | Wt 211.6 lb

## 2021-12-29 DIAGNOSIS — E669 Obesity, unspecified: Secondary | ICD-10-CM

## 2021-12-29 DIAGNOSIS — I10 Essential (primary) hypertension: Secondary | ICD-10-CM

## 2021-12-29 DIAGNOSIS — I272 Pulmonary hypertension, unspecified: Secondary | ICD-10-CM | POA: Diagnosis not present

## 2021-12-29 DIAGNOSIS — R0600 Dyspnea, unspecified: Secondary | ICD-10-CM

## 2021-12-29 NOTE — Patient Instructions (Signed)
There has been no changes to your medications. ? ?Your physician has requested that you have an echocardiogram. Echocardiography is a painless test that uses sound waves to create images of your heart. It provides your doctor with information about the size and shape of your heart and how well your heart?s chambers and valves are working. This procedure takes approximately one hour. There are no restrictions for this procedure. ? ?You have been referred to [facility]. They will call you to arrange your appointment. ? ?Your physician recommends that you schedule a follow-up appointment in: 1 year with an echocardiogram ( April 2024)  **please call the office in February 2024 to arrange your appointment** ? ?If you have any questions or concerns before your next appointment please send [place] a message through [facility] or call our office at [phone].   ? ?TO LEAVE A MESSAGE FOR THE NURSE SELECT OPTION 2, PLEASE LEAVE A MESSAGE INCLUDING: ?YOUR NAME ?DATE OF BIRTH ?CALL BACK NUMBER ?REASON FOR CALL**this is important as we prioritize the call backs ? ?YOU WILL RECEIVE A CALL BACK THE SAME DAY AS LONG AS YOU CALL BEFORE 4:00 PM ? ?At the [facility], you and your health needs are our priority. As part of our continuing mission to provide you with exceptional heart care, we have created designated Provider Care Teams. These Care Teams include your primary Cardiologist (physician) and Advanced Practice Providers (APPs- Physician Assistants and Nurse Practitioners) who all work together to provide you with the care you need, when you need it.  ? ?You may see any of the following providers on your designated Care Team at your next follow up: ?Dr Glori Bickers ?Dr Loralie Champagne ?Darrick Grinder, NP ?Lyda Jester, PA ?Jessica Milford,NP ?Marlyce Huge, PA ?Audry Riles, PharmD ? ? ?Please be sure to bring in all your medications bottles to every appointment.  ? ? ? ?

## 2021-12-29 NOTE — Progress Notes (Signed)
? ? ? ?PULMONARY HYPERTENSION CLINIC NOTE ? ?Referring Physician: Dr. Trudie Reed ?PCP: Dr. Hilma Favors  ?Cardiology: None ? ? ?HPI:  Regina Berry is 71 y.o. female smoker with scleroderma who is referred back by Dr. Trudie Reed for further evaluation of increasing SOB and LE edema. ? ?She has h/o tobacco use (1 ppd since 71 years old) - quit 11/21, HTN, hypothroidism, anxiety and obesity. Denies any known cardiac disease. Never had stress test. In 2009 had empyema requiring decortication. ? ?In 7/18 had CT scan: Moderate COPD. + patulous esophagus. LAD calcification  ? ?PFTs 11/18 ?FEV1 1.14 (47%) ?FVC 1.44 (46%) ?DLCO 42% ? ?We initially saw in 2018 and was ok. Referred back in 10/22 for increasing SOB.  ? ?ECHO 06/22/21 EF 42-70% normal diastolic parameters. Normal RV. No TR to estimate RVSP. REDS 32% ? ?Hires CT 10/22 mild ILD ? ?Cath 06/12/21: EF 55-60%. Normal cors  ? ?Ao = 163/80 (115) ?LV = 159/19 ?RA =  8 ?RV = 38/12 ?PA = 35/17 (25)    ?PCW = 15 ?Fick cardiac output/index = 7.2/3.7 ?PVR = 1.4 WU ?FA sat = 94% ?PA sat = 69%, 72% ?  ?Here for f/u. Says her breathing is much better. Denies CP, orthopnea or PND. Edema much better on low salt diet. BP at home has been good 120-130/60-70  ? ? ? ?Past Medical History:  ?Diagnosis Date  ? Anemia   ? Anxiety   ? Arthritis   ? Lt hip  ? Cancer [facility]) 2020  ? skin  Rt leg   ? Chronic bronchitis ([place])   ? COPD (chronic obstructive pulmonary disease) ([place])   ? Dyspnea   ? GERD (gastroesophageal reflux disease)   ? Hypertension   ? Hypothyroidism   ? Obesity (BMI 30-39.9) 12/21/2017  ? OSA on CPAP 12/21/2017  ? Severe with AHI 29.4/h and oxygen desaturations as low as 75%.  She is now on CPAP at 6 cm water pressure with 2 L oxygen due to nocturnal hypoxemia  ? Pelvic pressure in female 01/23/2014  ? Positive fecal occult blood test 06/23/2015  ? Rectocele 01/23/2014  ? 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 Thyroid disease   ? ? ?Current Outpatient Medications  ?Medication Sig Dispense Refill  ?  aspirin EC 81 MG tablet Take 81 mg by mouth daily. Swallow whole.    ? buPROPion (WELLBUTRIN XL) 300 MG 24 hr tablet Take 300 mg by mouth daily.    ? citalopram (CELEXA) 40 MG tablet Take 40 mg by mouth at bedtime.    ? ferrous sulfate 325 (65 FE) MG tablet Take 325 mg by mouth daily with breakfast.    ? furosemide (LASIX) 20 MG tablet Take 20 mg by mouth daily as needed (fluid retention.).     ? HYDROcodone-acetaminophen (NORCO) 10-325 MG tablet Take 1 tablet by mouth every 4 (four) hours as needed for severe pain.    ? levothyroxine (SYNTHROID) 88 MCG tablet Take 88 mcg by mouth daily before breakfast.     ? losartan-hydrochlorothiazide (HYZAAR) 100-25 MG tablet Take 1 tablet by mouth daily.     ? potassium chloride SA (K-DUR) 20 MEQ tablet Take 20 mEq by mouth daily.     ? zolpidem (AMBIEN) 10 MG tablet Take 10 mg by mouth at bedtime as needed for sleep.     ? ?No current facility-administered medications for this encounter.  ? ? ?Allergies  ?Allergen Reactions  ? Codeine Itching  ?  Other reaction(s): itching  ? ? ?  ?  Social History  ? ?Socioeconomic History  ? Marital status: Married  ?  Spouse name: Not on file  ? Number of children: Not on file  ? Years of education: Not on file  ? Highest education level: Not on file  ?Occupational History  ? Not on file  ?Tobacco Use  ? Smoking status: Former  ?  Packs/day: 1.00  ?  Years: 40.00  ?  Pack years: 40.00  ?  Types: Cigarettes  ?  Quit date: 07/2020  ?  Years since quitting: 1.4  ? Smokeless tobacco: Never  ?Vaping Use  ? Vaping Use: Former  ?Substance and Sexual Activity  ? Alcohol use: Yes  ?  Alcohol/week: 0.0 standard drinks  ?  Comment: occasionally  ? Drug use: No  ? Sexual activity: Not Currently  ?  Birth control/protection: Post-menopausal  ?Other Topics Concern  ? Not on file  ?Social History Narrative  ? Not on file  ? ?Social Determinants of Health  ? ?Financial Resource Strain: Low Risk   ? Difficulty of Paying Living Expenses: Not very hard  ?Food  Insecurity: No Food Insecurity  ? Worried About Charity fundraiser in the Last Year: Never true  ? Ran Out of Food in the Last Year: Never true  ?Transportation Needs: No Transportation Needs  ? Lack of Transportation (Medical): No  ? Lack of Transportation (Non-Medical): No  ?Physical Activity: Insufficiently Active  ? Days of Exercise per Week: 1 day  ? Minutes of Exercise per Session: 20 min  ?Stress: No Stress Concern Present  ? Feeling of Stress : Not at all  ?Social Connections: Moderately Integrated  ? Frequency of Communication with Friends and Family: More than three times a week  ? Frequency of Social Gatherings with Friends and Family: Once a week  ? Attends Religious Services: More than 4 times per year  ? Active Member of Clubs or Organizations: No  ? Attends Archivist Meetings: Never  ? Marital Status: Married  ?Intimate Partner Violence: Not At Risk  ? Fear of Current or Ex-Partner: No  ? Emotionally Abused: No  ? Physically Abused: No  ? Sexually Abused: No  ? ? ?  ?Family History  ?Problem Relation Age of Onset  ? Diabetes Mother   ? Cancer Mother   ?     ovarian  ? COPD Mother   ? Diabetes Father   ? Cancer Father   ?     lymphoma  ? Heart disease Father   ? Other Daughter   ?     transverse mylitis  ? ? ?Vitals:  ? 12/29/21 1123  ?BP: (!) 164/82  ?Pulse: 70  ?SpO2: 97%  ?Weight: 96 kg (211 lb 9.6 oz)  ? ?Wt Readings from Last 3 Encounters:  ?12/29/21 96 kg (211 lb 9.6 oz)  ?11/23/21 94.6 kg (208 lb 9.6 oz)  ?07/02/21 93 kg (205 lb)  ? ? ?PHYSICAL EXAM: ?General:  Obese woman No respiratory difficulty ?HEENT: normal ?Neck: supple. no JVD. Carotids 2+ bilat; no bruits. No lymphadenopathy or thryomegaly appreciated. ?Cor: PMI nondisplaced. Regular rate & rhythm. No rubs, gallops or murmurs. ?Lungs: clear ?Abdomen: obese soft, nontender, nondistended. No hepatosplenomegaly. No bruits or masses. Good bowel sounds. ?Extremities: no cyanosis, clubbing, rash, edema ?Neuro: alert & orientedx3,  cranial nerves grossly intact. moves all 4 extremities w/o difficulty. Affect pleasant ? ?ASSESSMENT & PLAN: ? ?1.  Dyspnea  ?- likely multifactorial due to COPD and obesity and OSA ?-  Echo 10/22 Normal LV/RV function and normal diastology. REDS 32% ?- RHC 10/22  PA 35/17 (25) PCW 15 Fick 7.2/3.7 PVR 1.4 WU -> not candidate for selective pulmonary artery vasodilators ?- Focus on weight loss and exercise ?- Repeat echo  ? ?2. Scleroderma ?- continue follow with yearly echos and PFTs ?- if evidence of increase PA pressures will need repeat RHC  ? ?3. LE edema.  ?- with normal echo and REDs suspect mainly venous insufficiency ? ?4. OSA ?- continue CPAP ? ?5. HTN ?-BP high here but well controlled at home  ? ?6. Obesity ?- will refer to PharmD for [place]  ? ? ?Glori Bickers, MD  ?12:31 PM ? ? ?

## 2021-12-31 ENCOUNTER — Telehealth: Payer: Self-pay | Admitting: Pharmacist

## 2021-12-31 NOTE — Telephone Encounter (Signed)
Pt referred by Dr Haroldine Laws for IVH2JW therapy for weight loss. Pt has Medicare insurance which does not cover weight loss medications like Wegovy or Saxenda. She does not have DM so her insurance does not cover other GLPs either. Called pt to advise her that her insurance will not cover GLP for weight loss. She inquires about the cash price of the drug, advised her this ranges from $1,200 to $1,500 per month depending on the formulation. She was appreciative for the call. ?

## 2022-02-24 NOTE — Patient Instructions (Signed)
ICD-10-CM   1. DOE (dyspnea on exertion)  R06.09     2. History of wheezing  Z87.898     3. History of scleroderma  Z87.39     4. Gastroesophageal reflux disease, unspecified whether esophagitis present  K21.9     5. Abnormal PFT  R94.2     6. ILD (interstitial lung disease) ([facility])  J84.9     7. Family history of asthma  Z82.5     8. Stopped smoking with greater than 40 pack year history  Z87.891      There is mild interstitial lung disease and CT scan October 2022 which I believe is similar to 2018 CT scan of the chest. I do not believe the level of fibrosis is greater than 10%.  However pulmonary function test shows a slow very mild decline over time -approximately 2% reduction in capacity per year over 7 years.  Technically does not meet definition for progressive phenotype  There is wheezing raising the question of associated asthma especially with family history  Plan -based on shared decision making - Check for allergic asthma-RAST allergy panel, CBC with differential, blood IgE and exam nitric oxide test -Rule out diastolic dysfunction: Check BNP -Evaluation for antifibrotic treatment  -At this point in time I recommend continued observation without starting antifibrotic treatment but will discuss in a case conference in the next few months -Control acid reflux  -Sleep with head end of the bed elevated  -Recommend taking PPI [currently on none]  Follow-up -We will indicate results of the blood test and if abnormal we will have you come in sooner -3 months to full pulmonary function test including bronchodilator response -Return to see Dr. Chase Caller in 3 months or sooner if needed -ILD symptom score and simple walking desaturation test at follow-up -

## 2022-02-24 NOTE — Progress Notes (Unsigned)
OV 11/23/2021 -new consult at the interstitial lung disease center with Dr. Chase Caller.  Referred by Dr. Glori Bickers  Subjective:  Patient ID: Regina Berry, female , DOB: 09/18/1950 , age 71 y.o. , MRN: [id number] , ADDRESS: [address] [place] [address] PCP Sharilyn Sites, MD Patient Care Team: Sharilyn Sites, MD as PCP - General (Family Medicine)  This Provider for this visit: Treatment Team:  Attending Provider: Brand Males, MD    11/23/2021 -   Chief Complaint  Patient presents with   Consult    PFT performed today.  Pt states that she does have complaints of SOB that is worse with exertion.     HPI Regina Berry 71 y.o. -is a former 40 pack tobacco smoker.  Lives in [place].  Retired from working in the tobacco plant.  As she has a history of CPAP usage because of sleep apnea.  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  Some 30 years ago she developed Raynaud's with whitening of her fingers.  And then 25 years ago she got diagnosed with scleroderma.  She is just on observation therapy.  The Raynaud's has improved over time.  Usually her fingers just turn pale white.  She has also had acid reflux for a long time.  She also has chronic pedal edema that is stable.  She is only on observation therapy.  Some 2 years ago because of ongoing mobility restrictions of her left hip she underwent hip surgery.  She has had total of 5 surgeries there including infection.  She always walks with a limp but her mobility did go up after the hip surgery and then she started noticing insidious onset of shortness of breath.  Always present with exertion.  Variable distances sometimes 20 feet sometimes longer.  But always present with exertion and relieved by rest.  There is associated wheezing with this but there is no cough.  She has had intermittent wheezing for the last 18 months.  Early in the morning and with exertion.  She does have  chronic acid reflux.  She has had serial pulmonary function test for the last 7 years.  On an average the FVC has declined 2 %/year.  The DLCO is also declined.  She had a right heart catheterization but her PVR is normal in October 2022 and therefore Dr. Haroldine Laws has not committed her to antipulm hypertension meds.  There is concern of ILD so she had a high-resolution CT chest that I personally visualized and showed her.  There is some bilateral bibasal mild ILD.  In 2018 she did have low-dose CT scan of the chest without contrast and there is ILD and that 2.  I personally think the level of ILD is the same but the comparisons are not accurate because the 2018 CT is not a high-res CT.  Review of the labs also shows chronic eosinophilia.  So we did a exam nitric oxide for asthma today especially given the history of wheezing.  She also tells me that multiple family members have asthma.  The nitric oxide test is 8 and normal.  Value systems: Overall prefers conservative line of management balancing risks and benefits.  CT Chest data  No results found.          OV 02/24/2022  Subjective:  Patient ID: Regina Berry, female , DOB: [birth year] , age 66 y.o. , MRN: [id number] , ADDRESS: [address] [place] [address] PCP Sharilyn Sites, MD Patient Care Team: Sharilyn Sites, MD as PCP - General [facility])  This Provider for this visit: Treatment Team:  Attending Provider: Brand Males, MD    02/24/2022 -  No chief complaint on file.    HPI Regina Berry 71 y.o. -    CT Chest data  No results found.    PFT     Latest Ref Rng & Units 11/23/2021    9:29 AM 06/29/2021   11:07 AM 07/19/2017    8:03 AM 07/13/2016    2:45 PM 09/12/2014   10:21 AM  PFT Results  FVC-Pre L 1.81  1.70  1.44   2.12   FVC-Predicted Pre % 60  56  46   68   FVC-Post L  2.00  1.49   2.07   FVC-Predicted Post %  65  47   66   Pre FEV1/FVC % % 69  75  79   74   Post FEV1/FCV %  %  70  76   77   FEV1-Pre L 1.24  1.27  1.14   1.58   FEV1-Predicted Pre % 54  55  47   66   FEV1-Post L  1.40  1.13   1.59   DLCO uncorrected ml/min/mmHg 11.98  6.53  10.43  14.54  14.68   DLCO UNC% % 61  33  42  63  64   DLCO corrected ml/min/mmHg 11.98  6.95  10.43   14.68   DLCO COR %Predicted % 61  35  42   64   DLVA Predicted % 76  54  62  81  82   TLC L  7.21  4.34   4.64   TLC % Predicted %  142  85   94   RV % Predicted %  243  134   119        has a past medical history of Anemia, Anxiety, Arthritis, Cancer ([place]) (2020), Chronic bronchitis ([facility]), COPD (chronic obstructive pulmonary disease) ([place]), Dyspnea, GERD (gastroesophageal reflux disease), Hypertension, Hypothyroidism, Obesity (BMI 30-39.9) (12/21/2017), OSA on CPAP (12/21/2017), Pelvic pressure in female (01/23/2014), Positive fecal occult blood test (06/23/2015), Rectocele (01/23/2014), Scleroderma, 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   reports that she quit smoking about 19 months ago. Her smoking use included cigarettes. She has a 40.00 pack-year smoking history. She has never used smokeless tobacco.  Past Surgical History:  Procedure Laterality Date   APPLICATION OF WOUND VAC Left 05/15/2020   Procedure: APPLICATION OF WOUND VAC;  Surgeon: Marchia Bond, MD;  Location: [facility];  Service: Orthopedics;  Laterality: Left;   CESAREAN SECTION     COLONOSCOPY N/A 09/11/2015   Procedure: COLONOSCOPY;  Surgeon: Rogene Houston, MD;  Location: [facility];  Service: Endoscopy;  Laterality: N/A;  moved to 1/6 @ 1:35 - Ann to notify pt   EYE SURGERY Bilateral 2021   INCISION AND DRAINAGE HIP Left 05/15/2020   Procedure: IRRIGATION AND DEBRIDEMENT HIP LEFT HIP, POSSIBLE POLLY EXCHANGE;  Surgeon: Marchia Bond, MD;  Location: [facility];  Service: Orthopedics;  Laterality: Left;   LUNG SURGERY Left    infection on outside of lung that had to be"scaped off".   RECTOCELE REPAIR N/A 01/02/2018   Procedure: POSTERIOR REPAIR (RECTOCELE);   Surgeon: Jonnie Kind, MD;  Location: [facility];  Service: Gynecology;  Laterality: N/A;   RIGHT/LEFT HEART CATH AND CORONARY ANGIOGRAPHY N/A 07/02/2021   Procedure:  RIGHT/LEFT HEART CATH AND CORONARY ANGIOGRAPHY;  Surgeon: Jolaine Artist, MD;  Location: [facility];  Service: Cardiovascular;  Laterality: N/A;   TOTAL HIP ARTHROPLASTY Left 03/17/2020   Procedure: TOTAL HIP ARTHROPLASTY;  Surgeon: Marchia Bond, MD;  Location: [facility];  Service: Orthopedics;  Laterality: Left;   TOTAL HIP ARTHROPLASTY Left 05/13/2020   Procedure: TOTAL HIP ARTHROPLASTY IRRIGATION AND DEBRIDEMENT, POLY EXCHANGE;  Surgeon: Marchia Bond, MD;  Location[facility];  Service: Orthopedics;  Laterality: Left;    Allergies  Allergen Reactions   Codeine Itching    Other reaction(s): itching    Immunization History  Administered Date(s) Administered   Influenza, High Dose Seasonal PF 09/25/2021   PFIZER(Purple Top)SARS-COV-2 Vaccination 12/05/2019, 12/27/2019    Family History  Problem Relation Age of Onset   Diabetes Mother    Cancer Mother        ovarian   COPD Mother    Diabetes Father    Cancer Father        lymphoma   Heart disease Father    Other Daughter        transverse mylitis     Current Outpatient Medications:    aspirin EC 81 MG tablet, Take 81 mg by mouth daily. Swallow whole., Disp: , Rfl:    buPROPion (WELLBUTRIN XL) 300 MG 24 hr tablet, Take 300 mg by mouth daily., Disp: , Rfl:    citalopram (CELEXA) 40 MG tablet, Take 40 mg by mouth at bedtime., Disp: , Rfl:    ferrous sulfate 325 (65 FE) MG tablet, Take 325 mg by mouth daily with breakfast., Disp: , Rfl:    furosemide (LASIX) 20 MG tablet, Take 20 mg by mouth daily as needed (fluid retention.). , Disp: , Rfl:    HYDROcodone-acetaminophen (NORCO) 10-325 MG tablet, Take 1 tablet by mouth every 4 (four) hours as needed for severe pain., Disp: , Rfl:    levothyroxine (SYNTHROID) 88 MCG tablet, Take 88 mcg by mouth daily before  breakfast. , Disp: , Rfl:    losartan-hydrochlorothiazide (HYZAAR) 100-25 MG tablet, Take 1 tablet by mouth daily. , Disp: , Rfl:    potassium chloride SA (K-DUR) 20 MEQ tablet, Take 20 mEq by mouth daily. , Disp: , Rfl:    zolpidem (AMBIEN) 10 MG tablet, Take 10 mg by mouth at bedtime as needed for sleep. , Disp: , Rfl:       Objective:   There were no vitals filed for this visit.  Estimated body mass index is 36.32 kg/m as calculated from the following:   Height as of 11/23/21: '5\' 4"'$  (1.626 m).   Weight as of 12/29/21: 211 lb 9.6 oz (96 kg).  '@WEIGHTCHANGE'$ @  There were no vitals filed for this visit.   Physical Exam  General Appearance:    Alert, cooperative, no distress, appears stated age - *** , Deconditioned looking - *** , OBESE  - ***, Sitting on Wheelchair -  ***  Head:    Normocephalic, without obvious abnormality, atraumatic  Eyes:    PERRL, conjunctiva/corneas clear,  Ears:    Normal TM's and external ear canals, both ears  Nose:   Nares normal, septum midline, mucosa normal, no drainage    or sinus tenderness. OXYGEN ON  - *** . Patient is @ ***   Throat:   Lips, mucosa, and tongue normal; teeth and gums normal. Cyanosis on lips - ***  Neck:   Supple, symmetrical, trachea midline, no adenopathy;    thyroid:  no enlargement/tenderness/nodules; no carotid   bruit or JVD  Back:     Symmetric, no curvature, ROM normal, no CVA tenderness  Lungs:     Distress - *** , Wheeze ***, Barrell Chest - ***, Purse lip breathing - ***, Crackles - ***   Chest Wall:    No tenderness or deformity.    Heart:    Regular rate and rhythm, S1 and S2 normal, no rub   or gallop, Murmur - ***  Breast Exam:    NOT DONE  Abdomen:     Soft, non-tender, bowel sounds active all four quadrants,    no masses, no organomegaly. Visceral obesity - ***  Genitalia:   NOT DONE  Rectal:   NOT DONE  Extremities:   Extremities - normal, Has Cane - ***, Clubbing - ***, Edema - ***  Pulses:   2+ and  symmetric all extremities  Skin:   Stigmata of Connective Tissue Disease - ***  Lymph nodes:   Cervical, supraclavicular, and axillary nodes normal  Psychiatric:  Neurologic:   Pleasant - ***, Anxious - ***, Flat affect - ***  CAm-ICU - neg, Alert and Oriented x 3 - yes, Moves all 4s - yes, Speech - normal, Cognition - intact    General: No distress. *** Neuro: Alert and Oriented x 3. GCS 15. Speech normal Psych: Pleasant Resp:  Barrel Chest - ***.  Wheeze - ***, Crackles - ***, No overt respiratory distress CVS: Normal heart sounds. Murmurs - *** Ext: Stigmata of Connective Tissue Disease - *** HEENT: Normal upper airway. PEERL +. No post nasal drip        Assessment:     No diagnosis found.     Plan:     There are no Patient Instructions on file for this visit.    SIGNATURE    Dr. Brand Males, M.D., F.C.C.P,  Pulmonary and Critical Care Medicine Staff Physician, [place] Director - Interstitial Lung Disease  Program  Pulmonary [place] at [place], [place], [address]  Pager: [phone], If no answer or between  15:00h - 7:00h: call [phone] Telephone: [phone]  6:38 PM 02/24/2022

## 2022-02-25 ENCOUNTER — Ambulatory Visit (INDEPENDENT_AMBULATORY_CARE_PROVIDER_SITE_OTHER): Payer: PPO | Admitting: Internal Medicine

## 2022-02-25 ENCOUNTER — Other Ambulatory Visit: Payer: Self-pay

## 2022-02-25 ENCOUNTER — Encounter: Payer: Self-pay | Admitting: Internal Medicine

## 2022-02-25 VITALS — BP 124/74 | HR 68 | Ht 63.0 in | Wt 213.2 lb

## 2022-02-25 DIAGNOSIS — M359 Systemic involvement of connective tissue, unspecified: Secondary | ICD-10-CM

## 2022-02-25 DIAGNOSIS — R0609 Other forms of dyspnea: Secondary | ICD-10-CM

## 2022-02-25 DIAGNOSIS — J8489 Other specified interstitial pulmonary diseases: Secondary | ICD-10-CM | POA: Diagnosis not present

## 2022-02-25 DIAGNOSIS — J849 Interstitial pulmonary disease, unspecified: Secondary | ICD-10-CM

## 2022-02-25 DIAGNOSIS — Z825 Family history of asthma and other chronic lower respiratory diseases: Secondary | ICD-10-CM

## 2022-02-25 DIAGNOSIS — Z87898 Personal history of other specified conditions: Secondary | ICD-10-CM

## 2022-02-25 DIAGNOSIS — R942 Abnormal results of pulmonary function studies: Secondary | ICD-10-CM | POA: Diagnosis not present

## 2022-02-25 DIAGNOSIS — Z87891 Personal history of nicotine dependence: Secondary | ICD-10-CM | POA: Diagnosis not present

## 2022-02-25 DIAGNOSIS — Z8739 Personal history of other diseases of the musculoskeletal system and connective tissue: Secondary | ICD-10-CM | POA: Diagnosis not present

## 2022-02-25 LAB — PULMONARY FUNCTION TEST
DL/VA % pred: 83 %
DL/VA: 3.46 ml/min/mmHg/L
DLCO cor % pred: 66 %
DLCO cor: 12.52 ml/min/mmHg
DLCO unc % pred: 65 %
DLCO unc: 12.4 ml/min/mmHg
FEF 25-75 Post: 1.38 L/sec
FEF 25-75 Pre: 1.03 L/sec
FEF2575-%Change-Post: 33 %
FEF2575-%Pred-Post: 74 %
FEF2575-%Pred-Pre: 55 %
FEV1-%Change-Post: 7 %
FEV1-%Pred-Post: 68 %
FEV1-%Pred-Pre: 63 %
FEV1-Post: 1.48 L
FEV1-Pre: 1.38 L
FEV1FVC-%Change-Post: -1 %
FEV1FVC-%Pred-Pre: 97 %
FEV6-%Change-Post: 10 %
FEV6-%Pred-Post: 74 %
FEV6-%Pred-Pre: 67 %
FEV6-Post: 2.04 L
FEV6-Pre: 1.85 L
FEV6FVC-%Change-Post: 0 %
FEV6FVC-%Pred-Post: 104 %
FEV6FVC-%Pred-Pre: 104 %
FVC-%Change-Post: 9 %
FVC-%Pred-Post: 70 %
FVC-%Pred-Pre: 64 %
FVC-Post: 2.04 L
FVC-Pre: 1.85 L
Post FEV1/FVC ratio: 73 %
Post FEV6/FVC ratio: 100 %
Pre FEV1/FVC ratio: 74 %
Pre FEV6/FVC Ratio: 100 %
RV % pred: 130 %
RV: 2.79 L
TLC % pred: 98 %
TLC: 4.84 L

## 2022-02-25 MED ORDER — FLUTICASONE FUROATE-VILANTEROL 100-25 MCG/ACT IN AEPB: 1.0000 | INHALATION_SPRAY | Freq: Every day | RESPIRATORY_TRACT | 0 refills | Status: AC

## 2022-03-09 LAB — ALPHA-1 ANTITRYPSIN PHENOTYPE: A-1 Antitrypsin, Ser: 155 mg/dL (ref 83–199)

## 2022-03-23 DIAGNOSIS — G4733 Obstructive sleep apnea (adult) (pediatric): Secondary | ICD-10-CM | POA: Diagnosis not present

## 2022-03-23 DIAGNOSIS — J449 Chronic obstructive pulmonary disease, unspecified: Secondary | ICD-10-CM | POA: Diagnosis not present

## 2022-03-31 DIAGNOSIS — I7300 Raynaud's syndrome without gangrene: Secondary | ICD-10-CM | POA: Diagnosis not present

## 2022-03-31 DIAGNOSIS — E669 Obesity, unspecified: Secondary | ICD-10-CM | POA: Diagnosis not present

## 2022-03-31 DIAGNOSIS — Z6836 Body mass index (BMI) 36.0-36.9, adult: Secondary | ICD-10-CM | POA: Diagnosis not present

## 2022-03-31 DIAGNOSIS — M349 Systemic sclerosis, unspecified: Secondary | ICD-10-CM | POA: Diagnosis not present

## 2022-03-31 DIAGNOSIS — M1991 Primary osteoarthritis, unspecified site: Secondary | ICD-10-CM | POA: Diagnosis not present

## 2022-04-14 DIAGNOSIS — E039 Hypothyroidism, unspecified: Secondary | ICD-10-CM | POA: Diagnosis not present

## 2022-04-14 DIAGNOSIS — G4700 Insomnia, unspecified: Secondary | ICD-10-CM | POA: Diagnosis not present

## 2022-04-14 DIAGNOSIS — I10 Essential (primary) hypertension: Secondary | ICD-10-CM | POA: Diagnosis not present

## 2022-04-14 DIAGNOSIS — J449 Chronic obstructive pulmonary disease, unspecified: Secondary | ICD-10-CM | POA: Diagnosis not present

## 2022-04-14 DIAGNOSIS — D509 Iron deficiency anemia, unspecified: Secondary | ICD-10-CM | POA: Diagnosis not present

## 2022-04-14 DIAGNOSIS — J42 Unspecified chronic bronchitis: Secondary | ICD-10-CM | POA: Diagnosis not present

## 2022-04-14 DIAGNOSIS — F419 Anxiety disorder, unspecified: Secondary | ICD-10-CM | POA: Diagnosis not present

## 2022-04-14 DIAGNOSIS — G4733 Obstructive sleep apnea (adult) (pediatric): Secondary | ICD-10-CM | POA: Diagnosis not present

## 2022-04-14 DIAGNOSIS — F330 Major depressive disorder, recurrent, mild: Secondary | ICD-10-CM | POA: Diagnosis not present

## 2022-04-14 DIAGNOSIS — E876 Hypokalemia: Secondary | ICD-10-CM | POA: Diagnosis not present

## 2022-04-14 DIAGNOSIS — G8929 Other chronic pain: Secondary | ICD-10-CM | POA: Diagnosis not present

## 2022-04-18 DIAGNOSIS — B078 Other viral warts: Secondary | ICD-10-CM | POA: Diagnosis not present

## 2022-04-18 DIAGNOSIS — Z1283 Encounter for screening for malignant neoplasm of skin: Secondary | ICD-10-CM | POA: Diagnosis not present

## 2022-04-18 DIAGNOSIS — C44719 Basal cell carcinoma of skin of left lower limb, including hip: Secondary | ICD-10-CM | POA: Diagnosis not present

## 2022-04-18 DIAGNOSIS — D225 Melanocytic nevi of trunk: Secondary | ICD-10-CM | POA: Diagnosis not present

## 2022-07-13 ENCOUNTER — Ambulatory Visit (HOSPITAL_COMMUNITY): Payer: PPO

## 2022-07-19 DIAGNOSIS — H04123 Dry eye syndrome of bilateral lacrimal glands: Secondary | ICD-10-CM | POA: Diagnosis not present

## 2022-08-23 ENCOUNTER — Ambulatory Visit (HOSPITAL_COMMUNITY)
Admission: RE | Admit: 2022-08-23 | Discharge: 2022-08-23 | Disposition: A | Discharge status: Home / Self Care | Payer: PPO | Source: Ambulatory Visit | Attending: Internal Medicine | Admitting: Internal Medicine

## 2022-08-23 DIAGNOSIS — J432 Centrilobular emphysema: Secondary | ICD-10-CM | POA: Diagnosis not present

## 2022-08-23 DIAGNOSIS — M359 Systemic involvement of connective tissue, unspecified: Secondary | ICD-10-CM | POA: Diagnosis not present

## 2022-08-23 DIAGNOSIS — J8489 Other specified interstitial pulmonary diseases: Secondary | ICD-10-CM | POA: Diagnosis not present

## 2022-08-23 DIAGNOSIS — R0609 Other forms of dyspnea: Secondary | ICD-10-CM | POA: Insufficient documentation

## 2022-08-23 DIAGNOSIS — S2241XA Multiple fractures of ribs, right side, initial encounter for closed fracture: Secondary | ICD-10-CM | POA: Diagnosis not present

## 2022-08-23 DIAGNOSIS — I700 Atherosclerosis of aorta: Secondary | ICD-10-CM | POA: Diagnosis not present

## 2022-08-23 DIAGNOSIS — J84112 Idiopathic pulmonary fibrosis: Secondary | ICD-10-CM | POA: Diagnosis not present

## 2022-09-14 DIAGNOSIS — Z23 Encounter for immunization: Secondary | ICD-10-CM | POA: Diagnosis not present

## 2022-09-28 DIAGNOSIS — S50312A Abrasion of left elbow, initial encounter: Secondary | ICD-10-CM | POA: Diagnosis not present

## 2022-09-28 DIAGNOSIS — E669 Obesity, unspecified: Secondary | ICD-10-CM | POA: Diagnosis not present

## 2022-09-28 DIAGNOSIS — Z6837 Body mass index (BMI) 37.0-37.9, adult: Secondary | ICD-10-CM | POA: Diagnosis not present

## 2022-09-28 DIAGNOSIS — M7918 Myalgia, other site: Secondary | ICD-10-CM | POA: Diagnosis not present

## 2022-09-28 DIAGNOSIS — W109XXA Fall (on) (from) unspecified stairs and steps, initial encounter: Secondary | ICD-10-CM | POA: Diagnosis not present

## 2022-11-02 DIAGNOSIS — Z0001 Encounter for general adult medical examination with abnormal findings: Secondary | ICD-10-CM | POA: Diagnosis not present

## 2022-11-02 DIAGNOSIS — I10 Essential (primary) hypertension: Secondary | ICD-10-CM | POA: Diagnosis not present

## 2022-11-02 DIAGNOSIS — Z1331 Encounter for screening for depression: Secondary | ICD-10-CM | POA: Diagnosis not present

## 2022-11-02 DIAGNOSIS — E7849 Other hyperlipidemia: Secondary | ICD-10-CM | POA: Diagnosis not present

## 2022-11-02 DIAGNOSIS — E039 Hypothyroidism, unspecified: Secondary | ICD-10-CM | POA: Diagnosis not present

## 2022-11-02 DIAGNOSIS — E6609 Other obesity due to excess calories: Secondary | ICD-10-CM | POA: Diagnosis not present

## 2022-11-02 DIAGNOSIS — E782 Mixed hyperlipidemia: Secondary | ICD-10-CM | POA: Diagnosis not present

## 2022-11-02 DIAGNOSIS — F419 Anxiety disorder, unspecified: Secondary | ICD-10-CM | POA: Diagnosis not present

## 2022-11-02 DIAGNOSIS — Z6835 Body mass index (BMI) 35.0-35.9, adult: Secondary | ICD-10-CM | POA: Diagnosis not present

## 2022-11-28 DIAGNOSIS — Z08 Encounter for follow-up examination after completed treatment for malignant neoplasm: Secondary | ICD-10-CM | POA: Diagnosis not present

## 2022-11-28 DIAGNOSIS — L570 Actinic keratosis: Secondary | ICD-10-CM | POA: Diagnosis not present

## 2022-11-28 DIAGNOSIS — Z85828 Personal history of other malignant neoplasm of skin: Secondary | ICD-10-CM | POA: Diagnosis not present

## 2022-11-28 DIAGNOSIS — X32XXXD Exposure to sunlight, subsequent encounter: Secondary | ICD-10-CM | POA: Diagnosis not present

## 2022-11-28 DIAGNOSIS — D0472 Carcinoma in situ of skin of left lower limb, including hip: Secondary | ICD-10-CM | POA: Diagnosis not present

## 2023-01-09 DIAGNOSIS — H903 Sensorineural hearing loss, bilateral: Secondary | ICD-10-CM | POA: Diagnosis not present

## 2023-01-16 DIAGNOSIS — H903 Sensorineural hearing loss, bilateral: Secondary | ICD-10-CM | POA: Diagnosis not present

## 2023-03-08 DIAGNOSIS — Z08 Encounter for follow-up examination after completed treatment for malignant neoplasm: Secondary | ICD-10-CM | POA: Diagnosis not present

## 2023-03-08 DIAGNOSIS — Z85828 Personal history of other malignant neoplasm of skin: Secondary | ICD-10-CM | POA: Diagnosis not present

## 2023-03-08 DIAGNOSIS — L820 Inflamed seborrheic keratosis: Secondary | ICD-10-CM | POA: Diagnosis not present

## 2023-04-03 DIAGNOSIS — Z6836 Body mass index (BMI) 36.0-36.9, adult: Secondary | ICD-10-CM | POA: Diagnosis not present

## 2023-04-03 DIAGNOSIS — M1991 Primary osteoarthritis, unspecified site: Secondary | ICD-10-CM | POA: Diagnosis not present

## 2023-04-03 DIAGNOSIS — E669 Obesity, unspecified: Secondary | ICD-10-CM | POA: Diagnosis not present

## 2023-04-03 DIAGNOSIS — I7300 Raynaud's syndrome without gangrene: Secondary | ICD-10-CM | POA: Diagnosis not present

## 2023-04-03 DIAGNOSIS — M349 Systemic sclerosis, unspecified: Secondary | ICD-10-CM | POA: Diagnosis not present

## 2023-05-02 ENCOUNTER — Other Ambulatory Visit (HOSPITAL_COMMUNITY): Payer: Self-pay | Admitting: Family Medicine

## 2023-05-02 DIAGNOSIS — Z1231 Encounter for screening mammogram for malignant neoplasm of breast: Secondary | ICD-10-CM

## 2023-05-11 ENCOUNTER — Ambulatory Visit (HOSPITAL_COMMUNITY)
Admission: RE | Admit: 2023-05-11 | Discharge: 2023-05-11 | Disposition: A | Discharge status: Home / Self Care | Payer: PPO | Source: Ambulatory Visit | Attending: Family Medicine | Admitting: Family Medicine

## 2023-05-11 ENCOUNTER — Encounter (HOSPITAL_COMMUNITY): Payer: Self-pay

## 2023-05-11 DIAGNOSIS — Z1231 Encounter for screening mammogram for malignant neoplasm of breast: Secondary | ICD-10-CM | POA: Insufficient documentation

## 2023-07-27 DIAGNOSIS — Z6835 Body mass index (BMI) 35.0-35.9, adult: Secondary | ICD-10-CM | POA: Diagnosis not present

## 2023-07-27 DIAGNOSIS — G4733 Obstructive sleep apnea (adult) (pediatric): Secondary | ICD-10-CM | POA: Diagnosis not present

## 2023-07-27 DIAGNOSIS — Z23 Encounter for immunization: Secondary | ICD-10-CM | POA: Diagnosis not present

## 2023-07-27 DIAGNOSIS — M349 Systemic sclerosis, unspecified: Secondary | ICD-10-CM | POA: Diagnosis not present

## 2023-07-27 DIAGNOSIS — G894 Chronic pain syndrome: Secondary | ICD-10-CM | POA: Diagnosis not present

## 2023-07-27 DIAGNOSIS — I10 Essential (primary) hypertension: Secondary | ICD-10-CM | POA: Diagnosis not present

## 2023-07-27 DIAGNOSIS — E6609 Other obesity due to excess calories: Secondary | ICD-10-CM | POA: Diagnosis not present

## 2023-07-27 DIAGNOSIS — E782 Mixed hyperlipidemia: Secondary | ICD-10-CM | POA: Diagnosis not present

## 2023-07-27 DIAGNOSIS — F419 Anxiety disorder, unspecified: Secondary | ICD-10-CM | POA: Diagnosis not present

## 2023-07-27 DIAGNOSIS — E039 Hypothyroidism, unspecified: Secondary | ICD-10-CM | POA: Diagnosis not present

## 2023-09-06 ENCOUNTER — Ambulatory Visit
Admission: EM | Admit: 2023-09-06 | Discharge: 2023-09-06 | Disposition: A | Discharge status: Home / Self Care | Payer: PPO | Attending: Family Medicine | Admitting: Family Medicine

## 2023-09-06 DIAGNOSIS — R600 Localized edema: Secondary | ICD-10-CM

## 2023-09-06 DIAGNOSIS — L03116 Cellulitis of left lower limb: Secondary | ICD-10-CM | POA: Diagnosis not present

## 2023-09-06 MED ORDER — MUPIROCIN 2 % EX OINT: 1.0000 | TOPICAL_OINTMENT | Freq: Two times a day (BID) | CUTANEOUS | 0 refills | Status: DC

## 2023-09-06 MED ORDER — DOXYCYCLINE HYCLATE 100 MG PO CAPS: 100.0000 mg | ORAL_CAPSULE | Freq: Two times a day (BID) | ORAL | 0 refills | Status: DC

## 2023-09-06 MED ORDER — CHLORHEXIDINE GLUCONATE 4 % EX SOLN: Freq: Every day | CUTANEOUS | 0 refills | Status: DC | PRN

## 2023-09-06 MED ORDER — DOXYCYCLINE HYCLATE 100 MG PO TABS
100.0000 mg | ORAL_TABLET | Freq: Once | ORAL | Status: AC
  Administered 2023-09-06: 100 mg via ORAL

## 2023-09-06 NOTE — Discharge Instructions (Signed)
 Elevate the leg at rest and wear gentle compression to help with swelling.  Clean the area at least once a day with Hibiclens  and apply mupirocin  ointment and a nonstick gauze pad dressing.  Take the full course of antibiotics and follow-up with your primary care provider for a recheck.

## 2023-09-06 NOTE — ED Triage Notes (Addendum)
 Pt reports she has place that she skinned on her left leg swelling, bruising  redness and itching present. The site is yellow in color. This injury was due to a fall that occurred on 08/29/2023

## 2023-09-06 NOTE — ED Provider Notes (Signed)
 [facility]    CSN: [id number] Arrival date & time: 09/06/23  1651      History   Chief Complaint No chief complaint on file.   HPI Regina Berry is a 73 y.o. female.   Patient presenting today with a worsening wound to the left anterior lower leg that occurred on 08/29/2023 when she missed the step at her house and hit the area on bricks.  She states initially it seemed to be healing without difficulty using Neosporin and bandages but the last few days it has become progressively more red, swollen and having yellow drainage.  Denies fever, chills, body aches, nausea, vomiting.    Past Medical History:  Diagnosis Date   Anemia    Anxiety    Arthritis    Lt hip   Cancer (HCC) 2020   skin  Rt leg    Chronic bronchitis (HCC)    COPD (chronic obstructive pulmonary disease) (HCC)    Dyspnea    GERD (gastroesophageal reflux disease)    Hypertension    Hypothyroidism    Obesity (BMI 30-39.9) 12/21/2017   OSA on CPAP 12/21/2017   Severe with AHI 29.4/h and oxygen  desaturations as low as 75%.  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   Status post total hip replacement, 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   Osteoarthritis of left hip 03/17/2020   Enterocele 01/02/2018   OSA on CPAP 12/21/2017   Obesity (BMI 30-39.9) 12/21/2017   Depression 08/18/2015   Positive fecal occult blood test 06/23/2015   Pelvic pressure in female 01/23/2014    Rectocele 01/23/2014    Past Surgical History:  Procedure Laterality Date   APPLICATION OF WOUND VAC Left 05/15/2020   Procedure: APPLICATION OF WOUND VAC;  Surgeon: Josefina Chew, MD;  Location: [facility];  Service: Orthopedics;  Laterality: Left;   CESAREAN SECTION     COLONOSCOPY N/A 09/11/2015   Procedure: COLONOSCOPY;  Surgeon: Claudis RAYMOND Rivet, MD;  Location: [facility];  Service: Endoscopy;  Laterality: N/A;  moved to 1/6 @ 1:35 - Ann to notify pt   EYE SURGERY Bilateral 2021   INCISION AND DRAINAGE HIP Left 05/15/2020   Procedure: IRRIGATION AND DEBRIDEMENT HIP LEFT HIP, POSSIBLE POLLY EXCHANGE;  Surgeon: Josefina Chew, MD;  Location: [facility];  Service: Orthopedics;  Laterality: Left;   LUNG SURGERY Left    infection on outside of lung that had to bescaped off.   RECTOCELE REPAIR N/A 01/02/2018   Procedure: POSTERIOR REPAIR (RECTOCELE);  Surgeon: Edsel Norleen GAILS, MD;  Location: [facility];  Service: Gynecology;  Laterality: N/A;   RIGHT/LEFT HEART CATH AND CORONARY ANGIOGRAPHY N/A 07/02/2021   Procedure: RIGHT/LEFT HEART CATH AND CORONARY ANGIOGRAPHY;  Surgeon: Cherrie Toribio SAUNDERS, MD;  Location: [facility];  Service: Cardiovascular;  Laterality: N/A;   TOTAL HIP ARTHROPLASTY Left 03/17/2020   Procedure: TOTAL HIP ARTHROPLASTY;  Surgeon: Josefina Chew, MD;  Location: [facility];  Service: Orthopedics;  Laterality: Left;   TOTAL HIP ARTHROPLASTY Left 05/13/2020   Procedure: TOTAL HIP ARTHROPLASTY IRRIGATION AND DEBRIDEMENT, POLY EXCHANGE;  Surgeon: Josefina Chew, MD;  Location: [facility];  Service: Orthopedics;  Laterality: Left;    OB History     Gravida  2   Para  2   Term      Preterm      AB      Living  2      SAB      IAB      Ectopic      Multiple      Live Births  2            Home Medications    Prior to Admission medications   Medication Sig Start Date End Date Taking? Authorizing Provider  chlorhexidine  (HIBICLENS ) 4 % external liquid Apply  topically daily as needed. 09/06/23  Yes Stuart Vernell Norris, PA-C  doxycycline  (VIBRAMYCIN ) 100 MG capsule Take 1 capsule (100 mg total) by mouth 2 (two) times daily. 09/06/23  Yes Stuart Vernell Norris, PA-C  mupirocin  ointment (BACTROBAN ) 2 % Apply 1 Application topically 2 (two) times daily. 09/06/23  Yes Stuart Vernell Norris, PA-C  aspirin  EC 81 MG tablet Take 81 mg by mouth daily. Swallow whole.    [provider]  buPROPion  (WELLBUTRIN  XL) 300 MG 24 hr tablet Take 300 mg by mouth daily. 03/13/19   [provider]  citalopram  (CELEXA ) 40 MG tablet Take 40 mg by mouth at bedtime.    [provider]  ferrous sulfate 325 (65 FE) MG tablet Take 325 mg by mouth daily with breakfast.    [provider]  fluticasone  furoate-vilanterol (BREO ELLIPTA ) 100-25 MCG/ACT AEPB Inhale 1 puff into the lungs daily. 02/25/22   Geronimo Amel, MD  furosemide  (LASIX ) 20 MG tablet Take 20 mg by mouth daily as needed (fluid retention.).     [provider]  HYDROcodone -acetaminophen  (NORCO) 10-325 MG tablet Take 1 tablet by mouth every 4 (four) hours as needed for severe pain.    [provider]  levothyroxine  (SYNTHROID ) 88 MCG tablet Take 88 mcg by mouth daily before breakfast.     [provider]  losartan -hydrochlorothiazide  (HYZAAR) 100-25 MG tablet Take 1 tablet by mouth daily.     [provider]  potassium chloride  SA (K-DUR) 20 MEQ tablet Take 20 mEq by mouth daily.     [provider]  zolpidem  (AMBIEN ) 10 MG tablet Take 10 mg by mouth at bedtime as needed for sleep.     [provider]    Family History Family History  Problem Relation Age of Onset   Diabetes Mother    Cancer Mother        ovarian   COPD Mother    Diabetes Father    Cancer Father        lymphoma   Heart disease Father    Other Daughter        transverse mylitis    Social History Social History   Tobacco Use   Smoking status: Former     Current packs/day: 0.00    Average packs/day: 1 pack/day for 40.0 years (40.0 ttl pk-yrs)    Types: Cigarettes    Start date: 07/1980    Quit date: 07/2020    Years since quitting: 3.1   Smokeless tobacco: Never  Vaping Use   Vaping status: Former  Substance Use Topics   Alcohol use: Yes  Alcohol/week: 0.0 standard drinks of alcohol    Comment: occasionally   Drug use: No     Allergies   Codeine   Review of Systems Review of Systems Per HPI  Physical Exam Triage Vital Signs ED Triage Vitals  Encounter Vitals Group     BP 09/06/23 1704 110/70     Systolic BP Percentile --      Diastolic BP Percentile --      Pulse Rate 09/06/23 1704 (!) 102     Resp 09/06/23 1704 16     Temp 09/06/23 1704 98.5 F (36.9 C)     Temp Source 09/06/23 1704 Oral     SpO2 09/06/23 1704 95 %     Weight --      Height --      Head Circumference --      Peak Flow --      Pain Score 09/06/23 1707 8     Pain Loc --      Pain Education --      Exclude from Growth Chart --    No data found.  Updated Vital Signs BP 110/70 (BP Location: Right Arm)   Pulse (!) 102   Temp 98.5 F (36.9 C) (Oral)   Resp 16   SpO2 95%   Visual Acuity Right Eye Distance:   Left Eye Distance:   Bilateral Distance:    Right Eye Near:   Left Eye Near:    Bilateral Near:     Physical Exam Vitals and nursing note reviewed.  Constitutional:      Appearance: Normal appearance. She is not ill-appearing.  HENT:     Head: Atraumatic.  Eyes:     Extraocular Movements: Extraocular movements intact.     Conjunctiva/sclera: Conjunctivae normal.  Cardiovascular:     Rate and Rhythm: Normal rate and regular rhythm.     Heart sounds: Normal heart sounds.  Pulmonary:     Effort: Pulmonary effort is normal.     Breath sounds: Normal breath sounds.  Musculoskeletal:        General: Swelling, tenderness and signs of injury present. Normal range of motion.     Cervical back: Normal range of motion and  neck supple.  Skin:    General: Skin is warm.     Comments: 2 cm ulcerated region to the anterior lower left leg with granulation tissue, no active bleeding or drainage, no fluctuance or induration.  Moderate surrounding erythema, tenderness to palpation and edema to the lower left leg  Neurological:     Mental Status: She is alert and oriented to person, place, and time.     Motor: No weakness.     Gait: Gait normal.     Comments: Left lower extremity neurovascularly intact  Psychiatric:        Mood and Affect: Mood normal.        Thought Content: Thought content normal.        Judgment: Judgment normal.      UC Treatments / Results  Labs (all labs ordered are listed, but only abnormal results are displayed) Labs Reviewed - No data to display  EKG   Radiology No results found.  Procedures Procedures (including critical care time)  Medications Ordered in UC Medications  doxycycline  (VIBRA -TABS) tablet 100 mg (100 mg Oral Given 09/06/23 1843)    Initial Impression / Assessment and Plan / UC Course  I have reviewed the triage vital signs and the nursing notes.  Pertinent labs &  imaging results that were available during my care of the patient were reviewed by me and considered in my medical decision making (see chart for details).     Treat with doxycycline , Hibiclens , mupirocin , good home wound care and discussed elevation, compression for edema.  She also states she has as needed Lasix  at home that she does not typically use, recommended trying 1 daily for the next few days to see if this helps with some of the edema around the wound.  Follow-up with PCP for recheck.  Final Clinical Impressions(s) / UC Diagnoses   Final diagnoses:  Cellulitis of leg, left  Leg edema, left     Discharge Instructions      Elevate the leg at rest and wear gentle compression to help with swelling.  Clean the area at least once a day with Hibiclens  and apply mupirocin  ointment and a  nonstick gauze pad dressing.  Take the full course of antibiotics and follow-up with your primary care provider for a recheck.    ED Prescriptions     Medication Sig Dispense Auth. Provider   doxycycline  (VIBRAMYCIN ) 100 MG capsule Take 1 capsule (100 mg total) by mouth 2 (two) times daily. 14 capsule Stuart Vernell Norris, PA-C   mupirocin  ointment (BACTROBAN ) 2 % Apply 1 Application topically 2 (two) times daily. 60 g Stuart Vernell Norris, PA-C   chlorhexidine  (HIBICLENS ) 4 % external liquid Apply topically daily as needed. 236 mL Stuart Vernell Norris, [place]      PDMP not reviewed this encounter.   Stuart Vernell Norris, [place] 09/06/23 1902

## 2023-10-30 ENCOUNTER — Telehealth (HOSPITAL_COMMUNITY): Payer: Self-pay

## 2023-10-30 NOTE — Telephone Encounter (Signed)
 Called and left patient a voice message to confirm/remind patient of their appointment [facility] on 10/31/23.

## 2023-10-31 ENCOUNTER — Encounter (HOSPITAL_COMMUNITY): Payer: Self-pay

## 2023-10-31 ENCOUNTER — Ambulatory Visit (HOSPITAL_COMMUNITY)
Admission: RE | Admit: 2023-10-31 | Discharge: 2023-10-31 | Disposition: A | Discharge status: Home / Self Care | Payer: PPO | Source: Ambulatory Visit | Attending: Family Medicine

## 2023-10-31 VITALS — BP 140/86 | HR 75 | Wt 186.6 lb

## 2023-10-31 DIAGNOSIS — G4733 Obstructive sleep apnea (adult) (pediatric): Secondary | ICD-10-CM | POA: Diagnosis not present

## 2023-10-31 DIAGNOSIS — I272 Pulmonary hypertension, unspecified: Secondary | ICD-10-CM

## 2023-10-31 DIAGNOSIS — E669 Obesity, unspecified: Secondary | ICD-10-CM | POA: Diagnosis not present

## 2023-10-31 DIAGNOSIS — M349 Systemic sclerosis, unspecified: Secondary | ICD-10-CM | POA: Insufficient documentation

## 2023-10-31 DIAGNOSIS — Z79899 Other long term (current) drug therapy: Secondary | ICD-10-CM | POA: Insufficient documentation

## 2023-10-31 DIAGNOSIS — R600 Localized edema: Secondary | ICD-10-CM | POA: Insufficient documentation

## 2023-10-31 DIAGNOSIS — J449 Chronic obstructive pulmonary disease, unspecified: Secondary | ICD-10-CM | POA: Insufficient documentation

## 2023-10-31 DIAGNOSIS — F419 Anxiety disorder, unspecified: Secondary | ICD-10-CM | POA: Diagnosis not present

## 2023-10-31 DIAGNOSIS — I10 Essential (primary) hypertension: Secondary | ICD-10-CM | POA: Insufficient documentation

## 2023-10-31 DIAGNOSIS — Z6833 Body mass index (BMI) 33.0-33.9, adult: Secondary | ICD-10-CM | POA: Insufficient documentation

## 2023-10-31 DIAGNOSIS — Z87891 Personal history of nicotine dependence: Secondary | ICD-10-CM | POA: Insufficient documentation

## 2023-10-31 DIAGNOSIS — R0600 Dyspnea, unspecified: Secondary | ICD-10-CM | POA: Diagnosis not present

## 2023-10-31 LAB — BASIC METABOLIC PANEL
Anion gap: 4 — ABNORMAL LOW (ref 5–15)
BUN: 14 mg/dL (ref 8–23)
CO2: 28 mmol/L (ref 22–32)
Calcium: 9.7 mg/dL (ref 8.9–10.3)
Chloride: 105 mmol/L (ref 98–111)
Creatinine, Ser: 1.05 mg/dL — ABNORMAL HIGH (ref 0.44–1.00)
GFR, Estimated: 56 mL/min — ABNORMAL LOW (ref >60)
Glucose, Bld: 89 mg/dL (ref 70–99)
Potassium: 3.9 mmol/L (ref 3.5–5.1)
Sodium: 137 mmol/L (ref 135–145)

## 2023-10-31 NOTE — Progress Notes (Signed)
 PULMONARY HYPERTENSION CLINIC NOTE PCP: Dr. Phillips Odor  Rheum: Dr. Nickola Major HF Cardiology: Dr. Gala Romney  HPI:   Regina Berry is 73 y.o. female smoker with scleroderma.  She has h/o tobacco use (1 ppd since 73 years old) - quit 11/21, HTN, hypothroidism, anxiety and obesity. Denies any known cardiac disease. Never had stress test. In 2009 had empyema requiring decortication.  In 7/18 had CT scan: Moderate COPD. + patulous esophagus. LAD calcification   PFTs 11/18 FEV1 1.14 (47%) FVC 1.44 (46%) DLCO 42%  We initially saw in 2018 and was ok. Referred back in 10/22 for increasing SOB.   Echo 06/22/21 EF 60-65% normal diastolic parameters. Normal RV. No TR to estimate RVSP. REDS 32%  Hires CT 10/22 mild ILD  Cath 06/12/21: EF 55-60%. Normal cors  Ao = 163/80 (115) LV = 159/19 RA =  8 RV = 38/12 PA = 35/17 (25)    PCW = 15 Fick cardiac output/index = 7.2/3.7 PVR = 1.4 WU FA sat = 94% PA sat = 69%, 72%   Last seen in [facility] 12/2021. Doing well. Suspected dyspnea 2/2 to COPD and obesity. Based on RHC, no role for selective pulmonary artery vasodilators.  PFTS 02/2022 FEV1 1.38 (63%) FVC 1.85 (64%) DLCO 65%  Today she returns for HF follow up with her husband. We have not seen her since 12/2021. Overall feeling fine. Now on GLP1RA, weight down from 217 lbs-->184 lbs, feels great. No SOB with activity now. Denies palpitations, abnormal bleeding, CP, dizziness, edema, or PND/Orthopnea. Appetite ok. No fever or chills. Weight at home 184 pounds. Taking all medications. Takes Lasix 2-3x/month with good response. Not wearing CPAP. Home BP 126/80s.   Past Medical History:  Diagnosis Date   Anemia    Anxiety    Arthritis    Lt hip   Cancer (HCC) 2020   skin  Rt leg    Chronic bronchitis (HCC)    COPD (chronic obstructive pulmonary disease) (HCC)    Dyspnea    GERD (gastroesophageal reflux disease)    Hypertension    Hypothyroidism    Obesity (BMI 30-39.9) 12/21/2017   OSA on CPAP  12/21/2017   Severe with AHI 29.4/h and oxygen desaturations as low as 75%.  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HCC)    Thyroid disease    Current Outpatient Medications  Medication Sig Dispense Refill   Ascorbic Acid (VITAMIN C) 1000 MG tablet Take 1,000 mg by mouth daily.     aspirin EC 81 MG tablet Take 81 mg by mouth daily. Swallow whole.     buPROPion (WELLBUTRIN XL) 300 MG 24 hr tablet Take 300 mg by mouth daily.     Cholecalciferol (VITAMIN D) 50 MCG (2000 UT) tablet Take 2,000 Units by mouth daily.     citalopram (CELEXA) 40 MG tablet Take 40 mg by mouth at bedtime.     famotidine (PEPCID) 20 MG tablet Take 20 mg by mouth 2 (two) times daily.     fenoprofen (NALFON) 600 MG TABS tablet Take 600 mg by mouth.     ferrous sulfate 325 (65 FE) MG tablet Take 325 mg by mouth daily with breakfast.     furosemide (LASIX) 20 MG tablet Take 20 mg by mouth daily as needed (fluid retention.).  HYDROcodone-acetaminophen (NORCO) 10-325 MG tablet Take 1 tablet by mouth every 4 (four) hours as needed for severe pain.     levothyroxine (SYNTHROID) 88 MCG tablet Take 88 mcg by mouth daily before breakfast.      losartan-hydrochlorothiazide (HYZAAR) 100-25 MG tablet Take 1 tablet by mouth daily.      zolpidem (AMBIEN) 10 MG tablet Take 10 mg by mouth at bedtime as needed for sleep.      fluticasone furoate-vilanterol (BREO ELLIPTA) 100-25 MCG/ACT AEPB Inhale 1 puff into the lungs daily. (Patient not taking: Reported on 10/31/2023) 14 each 0   potassium chloride SA (K-DUR) 20 MEQ tablet Take 20 mEq by mouth daily.  (Patient not taking: Reported on 10/31/2023)     No current facility-administered medications for this encounter.   Allergies  Allergen Reactions   Codeine Itching    Other reaction(s): itching   Social  History   Socioeconomic History   Marital status: Married    Spouse name: Not on file   Number of children: Not on file   Years of education: Not on file   Highest education level: Not on file  Occupational History   Not on file  Tobacco Use   Smoking status: Former    Current packs/day: 0.00    Average packs/day: 1 pack/day for 40.0 years (40.0 ttl pk-yrs)    Types: Cigarettes    Start date: 07/1980    Quit date: 07/2020    Years since quitting: 3.3   Smokeless tobacco: Never  Vaping Use   Vaping status: Former  Substance and Sexual Activity   Alcohol use: Yes    Alcohol/week: 0.0 standard drinks of alcohol    Comment: occasionally   Drug use: No   Sexual activity: Not Currently    Birth control/protection: Post-menopausal  Other Topics Concern   Not on file  Social History Narrative   Not on file   Social Drivers of Health   Financial Resource Strain: Low Risk  (01/26/2021)   Overall Financial Resource Strain (CARDIA)    Difficulty of Paying Living Expenses: Not very hard  Food Insecurity: No Food Insecurity (01/26/2021)   Hunger Vital Sign    Worried About Running Out of Food in the Last Year: Never true    Ran Out of Food in the Last Year: Never true  Transportation Needs: No Transportation Needs (01/26/2021)   PRAPARE - Administrator, Civil Service (Medical): No    Lack of Transportation (Non-Medical): No  Physical Activity: Insufficiently Active (01/26/2021)   Exercise Vital Sign    Days of Exercise per Week: 1 day    Minutes of Exercise per Session: 20 min  Stress: No Stress Concern Present (01/26/2021)   [employer] of Occupational Health - Occupational Stress Questionnaire    Feeling of Stress : Not at all  Social Connections: Moderately Integrated (01/26/2021)   Social Connection and Isolation Panel [NHANES]    Frequency of Communication with Friends and Family: More than three times a week    Frequency of Social Gatherings with Friends  and Family: Once a week    Attends Religious Services: More than 4 times per year    Active Member of [employer] or Organizations: No    Attends Banker Meetings: Never    Marital Status: Married  Catering manager Violence: Not At Risk (01/26/2021)   Humiliation, Afraid, Rape, and Kick questionnaire    Fear of Current or Ex-Partner: No    Emotionally Abused: No  Physically Abused: No    Sexually Abused: No   Family History  Problem Relation Age of Onset   Diabetes Mother    Cancer Mother        ovarian   COPD Mother    Diabetes Father    Cancer Father        lymphoma   Heart disease Father    Other Daughter        transverse mylitis   BP (!) 140/86   Pulse 75   Wt 84.6 kg (186 lb 9.6 oz)   SpO2 92%   BMI 33.05 kg/m   Wt Readings from Last 3 Encounters:  10/31/23 84.6 kg (186 lb 9.6 oz)  02/25/22 96.7 kg (213 lb 3.2 oz)  12/29/21 96 kg (211 lb 9.6 oz)   PHYSICAL EXAM: General:  NAD. No resp difficulty, walked into clinic HEENT: Normal Neck: Supple. No JVD. Cor: Regular rate & rhythm. No rubs, gallops or murmurs. Lungs: Clear Abdomen: Soft, obese, nontender, nondistended.  Extremities: No cyanosis, clubbing, rash, edema Neuro: Alert & oriented x 3, moves all 4 extremities w/o difficulty. Affect pleasant.  ReDs reading: 33%, normal  ECG (personally reviewed): NSR 69 bpm  ASSESSMENT & PLAN: 1.  Dyspnea  - likely multifactorial due to COPD, obesity and OSA - Echo 10/22: normal LV/RV function and normal diastology, REDS at that time 32% - RHC 10/22:  PA 35/17 (25), PCW 15, CO/CI (Fick) 7.2/3.7, PVR 1.4 WU -> not candidate for selective pulmonary artery vasodilators - Breathing improved! Continue with weight loss and exercise - Repeat echo & PFTs  2. Scleroderma - Continue follow with yearly echos and PFTs. - If evidence of increase PA pressures, will need repeat RHC.  - As above, repeat echo  3. LE edema  - With normal echo and REDs, suspect was due  mainly venous insufficiency. - Resolved. - Continue Lasix PRN  4. OSA - No longer wearing CPAP - Follows with Dr. Mayford Knife, will send office a message. With weight loss, may not need CPAP  5. HTN - BP elevated here, but well controlled at home  - Continue current losartan/hydrochlorothiazide - BMET today  6. Obesity - Body mass index is 33.05 kg/m. - On GLP1RA thru wellness clinic - Weight down 217-->184 lbs. - Congratulated!  Follow up in 1 year with Dr. Gala Romney.  Jacklynn Ganong, FNP  10:28 AM

## 2023-10-31 NOTE — Patient Instructions (Addendum)
 No medication changes. Labs today - will call you if abnormal. Echo has been ordered, will call you to schedule. Pulmonary function tests have been ordered - will call you to schedule. We have asked Dr. Mayford Knife to see you in follow up to your OSA due to significant weight loss. Please follow up with Dr. Marchelle Gearing. Return to see Dr. Gala Romney in one year. Please call us at [phone] in January 2026 to schedule this appointment. Please call us at [phone] if any questions or concerns prior to your next visit.

## 2023-10-31 NOTE — Progress Notes (Signed)
 ReDS Vest / Clip - 10/31/23 1000       ReDS Vest / Clip   Station Marker A    Ruler Value 28    ReDS Value Range Low volume    ReDS Actual Value 33

## 2023-11-09 ENCOUNTER — Ambulatory Visit (HOSPITAL_COMMUNITY)
Admission: RE | Admit: 2023-11-09 | Discharge: 2023-11-09 | Disposition: A | Discharge status: Home / Self Care | Source: Ambulatory Visit | Attending: Family Medicine | Admitting: Family Medicine

## 2023-11-09 DIAGNOSIS — M3489 Other systemic sclerosis: Secondary | ICD-10-CM

## 2023-11-09 DIAGNOSIS — M349 Systemic sclerosis, unspecified: Secondary | ICD-10-CM | POA: Diagnosis not present

## 2023-11-09 LAB — ECHOCARDIOGRAM COMPLETE
Area-P 1/2: 3.42 cm2
S' Lateral: 3.1 cm

## 2023-11-09 NOTE — Progress Notes (Signed)
*  PRELIMINARY RESULTS* Echocardiogram 2D Echocardiogram has been performed.  Stacey Drain 11/09/2023, 9:16 AM

## 2023-11-22 ENCOUNTER — Telehealth (HOSPITAL_COMMUNITY): Payer: Self-pay | Admitting: Cardiology

## 2023-11-22 DIAGNOSIS — R0600 Dyspnea, unspecified: Secondary | ICD-10-CM

## 2023-11-22 NOTE — Telephone Encounter (Signed)
 PFT order changed to correct location [facility] to [facility]

## 2023-12-19 ENCOUNTER — Ambulatory Visit (HOSPITAL_COMMUNITY): Admission: RE | Admit: 2023-12-19 | Source: Ambulatory Visit

## 2024-03-05 ENCOUNTER — Encounter: Payer: Self-pay | Admitting: Plastic Surgery

## 2024-03-05 ENCOUNTER — Ambulatory Visit: Admitting: Plastic Surgery

## 2024-03-05 VITALS — BP 115/73 | HR 72 | Ht 63.0 in | Wt 171.2 lb

## 2024-03-05 DIAGNOSIS — H02831 Dermatochalasis of right upper eyelid: Secondary | ICD-10-CM | POA: Diagnosis not present

## 2024-03-05 DIAGNOSIS — H02835 Dermatochalasis of left lower eyelid: Secondary | ICD-10-CM | POA: Diagnosis not present

## 2024-03-05 DIAGNOSIS — H02832 Dermatochalasis of right lower eyelid: Secondary | ICD-10-CM

## 2024-03-05 DIAGNOSIS — H02834 Dermatochalasis of left upper eyelid: Secondary | ICD-10-CM | POA: Diagnosis not present

## 2024-03-05 NOTE — Progress Notes (Signed)
 Patient ID: Regina Berry, female    DOB: July 23, 1951, 73 y.o.   MRN: [id number]   Chief Complaint  Patient presents with   Consult         The patient is a 73 year old female here for evaluation of her lower legs.  The patient states that she does not like the fullness.  Upon further discussion she does have some visual field impairment of the upper lids that are resting on her lashes.  This has been going on for years but has been getting worse over the past year.  She has not had a visual field exam yet.  She is open to the possibility of upper and lower lid blepharoplasties.    Review of Systems  Constitutional: Negative.   Eyes: Negative.   Respiratory: Negative.    Cardiovascular: Negative.   Gastrointestinal: Negative.   Endocrine: Negative.   Genitourinary: Negative.     Past Medical History:  Diagnosis Date   Anemia    Anxiety    Arthritis    Lt hip   Cancer (HCC) 2020   skin  Rt leg    Chronic bronchitis (HCC)    COPD (chronic obstructive pulmonary disease) (HCC)    Dyspnea    GERD (gastroesophageal reflux disease)    Hypertension    Hypothyroidism    Obesity (BMI 30-39.9) 12/21/2017   OSA on CPAP 12/21/2017   Severe with AHI 29.4/h and oxygen  desaturations as low as 75%.  She is now on CPAP at 6 cm water  pressure with 2 L oxygen  due to nocturnal hypoxemia   Pelvic pressure in female 01/23/2014   Positive fecal occult blood test 06/23/2015   Rectocele 01/23/2014   Scleroderma, limited (HCC)    Thyroid  disease     Past Surgical History:  Procedure Laterality Date   APPLICATION OF WOUND VAC Left 05/15/2020   Procedure: APPLICATION OF WOUND VAC;  Surgeon: Josefina Chew, MD;  Location: [facility];  Service: Orthopedics;  Laterality: Left;   CESAREAN SECTION     COLONOSCOPY N/A 09/11/2015   Procedure: COLONOSCOPY;  Surgeon: Claudis RAYMOND Rivet, MD;  Location: [facility];  Service: Endoscopy;  Laterality: N/A;  moved to 1/6 @ 1:35 - Ann to notify pt   EYE SURGERY  Bilateral 2021   INCISION AND DRAINAGE HIP Left 05/15/2020   Procedure: IRRIGATION AND DEBRIDEMENT HIP LEFT HIP, POSSIBLE POLLY EXCHANGE;  Surgeon: Josefina Chew, MD;  Location: [facility];  Service: Orthopedics;  Laterality: Left;   LUNG SURGERY Left    infection on outside of lung that had to bescaped off.   RECTOCELE REPAIR N/A 01/02/2018   Procedure: POSTERIOR REPAIR (RECTOCELE);  Surgeon: Edsel Norleen GAILS, MD;  Location: [facility];  Service: Gynecology;  Laterality: N/A;   RIGHT/LEFT HEART CATH AND CORONARY ANGIOGRAPHY N/A 07/02/2021   Procedure: RIGHT/LEFT HEART CATH AND CORONARY ANGIOGRAPHY;  Surgeon: Cherrie Toribio SAUNDERS, MD;  Location: [facility];  Service: Cardiovascular;  Laterality: N/A;   TOTAL HIP ARTHROPLASTY Left 03/17/2020   Procedure: TOTAL HIP ARTHROPLASTY;  Surgeon: Josefina Chew, MD;  Location: [facility];  Service: Orthopedics;  Laterality: Left;   TOTAL HIP ARTHROPLASTY Left 05/13/2020   Procedure: TOTAL HIP ARTHROPLASTY IRRIGATION AND DEBRIDEMENT, POLY EXCHANGE;  Surgeon: Josefina Chew, MD;  Location: [facility];  Service: Orthopedics;  Laterality: Left;      Current Outpatient Medications:    Ascorbic Acid (VITAMIN C) 1000 MG tablet, Take 1,000 mg by mouth daily., Disp: , Rfl:  aspirin  EC 81 MG tablet, Take 81 mg by mouth daily. Swallow whole., Disp: , Rfl:    buPROPion  (WELLBUTRIN  XL) 300 MG 24 hr tablet, Take 300 mg by mouth daily., Disp: , Rfl:    Cholecalciferol (VITAMIN D) 50 MCG (2000 UT) tablet, Take 2,000 Units by mouth daily., Disp: , Rfl:    citalopram  (CELEXA ) 40 MG tablet, Take 40 mg by mouth at bedtime., Disp: , Rfl:    famotidine (PEPCID) 20 MG tablet, Take 20 mg by mouth 2 (two) times daily., Disp: , Rfl:    fenoprofen (NALFON) 600 MG TABS tablet, Take 600 mg by mouth., Disp: , Rfl:    ferrous sulfate 325 (65 FE) MG tablet, Take 325 mg by mouth daily with breakfast., Disp: , Rfl:    fluticasone  furoate-vilanterol (BREO ELLIPTA ) 100-25 MCG/ACT AEPB, Inhale 1  puff into the lungs daily., Disp: 14 each, Rfl: 0   furosemide  (LASIX ) 20 MG tablet, Take 20 mg by mouth daily as needed (fluid retention.). , Disp: , Rfl:    HYDROcodone -acetaminophen  (NORCO) 10-325 MG tablet, Take 1 tablet by mouth every 4 (four) hours as needed for severe pain., Disp: , Rfl:    levothyroxine  (SYNTHROID ) 88 MCG tablet, Take 88 mcg by mouth daily before breakfast. , Disp: , Rfl:    losartan -hydrochlorothiazide  (HYZAAR) 100-25 MG tablet, Take 1 tablet by mouth daily. , Disp: , Rfl:    potassium chloride  SA (K-DUR) 20 MEQ tablet, Take 20 mEq by mouth daily. , Disp: , Rfl:    zolpidem  (AMBIEN ) 10 MG tablet, Take 10 mg by mouth at bedtime as needed for sleep. , Disp: , Rfl:    Objective:   Vitals:   03/05/24 1326  BP: 115/73  Pulse: 72  SpO2: 95%    Physical Exam Constitutional:      Appearance: Normal appearance.  HENT:     Head: Atraumatic.   Cardiovascular:     Rate and Rhythm: Normal rate.     Pulses: Normal pulses.   Skin:    Capillary Refill: Capillary refill takes less than 2 seconds.   Neurological:     Mental Status: She is alert and oriented to person, place, and time.   Psychiatric:        Mood and Affect: Mood normal.        Behavior: Behavior normal.        Thought Content: Thought content normal.        Judgment: Judgment normal.     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 Zaldivar.  I have sent him a message and we will send a referral as well.  Pictures were obtained of the patient and placed in the chart with the patient's or guardian's permission.   Regina RAMAN Larue Lightner, DO

## 2024-03-25 DIAGNOSIS — Z6828 Body mass index (BMI) 28.0-28.9, adult: Secondary | ICD-10-CM | POA: Diagnosis not present

## 2024-03-25 DIAGNOSIS — M1991 Primary osteoarthritis, unspecified site: Secondary | ICD-10-CM | POA: Diagnosis not present

## 2024-03-25 DIAGNOSIS — E663 Overweight: Secondary | ICD-10-CM | POA: Diagnosis not present

## 2024-03-25 DIAGNOSIS — I7300 Raynaud's syndrome without gangrene: Secondary | ICD-10-CM | POA: Diagnosis not present

## 2024-03-25 DIAGNOSIS — M349 Systemic sclerosis, unspecified: Secondary | ICD-10-CM | POA: Diagnosis not present

## 2024-04-15 DIAGNOSIS — H02412 Mechanical ptosis of left eyelid: Secondary | ICD-10-CM | POA: Diagnosis not present

## 2024-04-15 DIAGNOSIS — H0279 Other degenerative disorders of eyelid and periocular area: Secondary | ICD-10-CM | POA: Diagnosis not present

## 2024-04-15 DIAGNOSIS — H53483 Generalized contraction of visual field, bilateral: Secondary | ICD-10-CM | POA: Diagnosis not present

## 2024-04-15 DIAGNOSIS — H57813 Brow ptosis, bilateral: Secondary | ICD-10-CM | POA: Diagnosis not present

## 2024-04-15 DIAGNOSIS — H02413 Mechanical ptosis of bilateral eyelids: Secondary | ICD-10-CM | POA: Diagnosis not present

## 2024-04-15 DIAGNOSIS — H02411 Mechanical ptosis of right eyelid: Secondary | ICD-10-CM | POA: Diagnosis not present

## 2024-04-15 DIAGNOSIS — Z01818 Encounter for other preprocedural examination: Secondary | ICD-10-CM | POA: Diagnosis not present

## 2024-04-15 DIAGNOSIS — H02834 Dermatochalasis of left upper eyelid: Secondary | ICD-10-CM | POA: Diagnosis not present

## 2024-04-15 DIAGNOSIS — H02831 Dermatochalasis of right upper eyelid: Secondary | ICD-10-CM | POA: Diagnosis not present

## 2024-04-16 DIAGNOSIS — Z0001 Encounter for general adult medical examination with abnormal findings: Secondary | ICD-10-CM | POA: Diagnosis not present

## 2024-04-16 DIAGNOSIS — F419 Anxiety disorder, unspecified: Secondary | ICD-10-CM | POA: Diagnosis not present

## 2024-04-16 DIAGNOSIS — Z1331 Encounter for screening for depression: Secondary | ICD-10-CM | POA: Diagnosis not present

## 2024-04-16 DIAGNOSIS — E782 Mixed hyperlipidemia: Secondary | ICD-10-CM | POA: Diagnosis not present

## 2024-04-16 DIAGNOSIS — I10 Essential (primary) hypertension: Secondary | ICD-10-CM | POA: Diagnosis not present

## 2024-04-16 DIAGNOSIS — E039 Hypothyroidism, unspecified: Secondary | ICD-10-CM | POA: Diagnosis not present

## 2024-04-16 DIAGNOSIS — M349 Systemic sclerosis, unspecified: Secondary | ICD-10-CM | POA: Diagnosis not present

## 2024-04-16 DIAGNOSIS — G4733 Obstructive sleep apnea (adult) (pediatric): Secondary | ICD-10-CM | POA: Diagnosis not present

## 2024-04-16 DIAGNOSIS — Z6828 Body mass index (BMI) 28.0-28.9, adult: Secondary | ICD-10-CM | POA: Diagnosis not present

## 2024-07-25 DIAGNOSIS — H43393 Other vitreous opacities, bilateral: Secondary | ICD-10-CM | POA: Diagnosis not present

## 2024-08-21 ENCOUNTER — Encounter (INDEPENDENT_AMBULATORY_CARE_PROVIDER_SITE_OTHER): Payer: Self-pay | Admitting: *Deleted
